# Patient Record
Sex: Male | Born: 1955 | Hispanic: Refuse to answer | Marital: Married | Smoking: Former smoker
Health system: Southern US, Community
[De-identification: ages and names within clinical notes are randomized; demographics above are authoritative.]

## PROBLEM LIST (undated history)

## (undated) DIAGNOSIS — M4850XA Collapsed vertebra, not elsewhere classified, site unspecified, initial encounter for fracture: Secondary | ICD-10-CM

## (undated) DIAGNOSIS — R0602 Shortness of breath: Secondary | ICD-10-CM

## (undated) DIAGNOSIS — E079 Disorder of thyroid, unspecified: Secondary | ICD-10-CM

## (undated) DIAGNOSIS — J439 Emphysema, unspecified: Secondary | ICD-10-CM

## (undated) DIAGNOSIS — R918 Other nonspecific abnormal finding of lung field: Secondary | ICD-10-CM

## (undated) DIAGNOSIS — G4730 Sleep apnea, unspecified: Secondary | ICD-10-CM

## (undated) DIAGNOSIS — J449 Chronic obstructive pulmonary disease, unspecified: Secondary | ICD-10-CM

## (undated) DIAGNOSIS — G4734 Idiopathic sleep related nonobstructive alveolar hypoventilation: Secondary | ICD-10-CM

## (undated) DIAGNOSIS — E538 Deficiency of other specified B group vitamins: Secondary | ICD-10-CM

## (undated) DIAGNOSIS — E039 Hypothyroidism, unspecified: Secondary | ICD-10-CM

## (undated) DIAGNOSIS — E785 Hyperlipidemia, unspecified: Secondary | ICD-10-CM

## (undated) DIAGNOSIS — J189 Pneumonia, unspecified organism: Secondary | ICD-10-CM

## (undated) DIAGNOSIS — D696 Thrombocytopenia, unspecified: Secondary | ICD-10-CM

## (undated) DIAGNOSIS — Z8709 Personal history of other diseases of the respiratory system: Secondary | ICD-10-CM

## (undated) DIAGNOSIS — M5136 Other intervertebral disc degeneration, lumbar region: Secondary | ICD-10-CM

## (undated) DIAGNOSIS — M75121 Complete rotator cuff tear or rupture of right shoulder, not specified as traumatic: Secondary | ICD-10-CM

## (undated) DIAGNOSIS — M51369 Other intervertebral disc degeneration, lumbar region without mention of lumbar back pain or lower extremity pain: Secondary | ICD-10-CM

## (undated) HISTORY — PX: ELBOW ARTHROSCOPY: SHX614

## (undated) HISTORY — PX: TONSILLECTOMY: SUR1361

## (undated) HISTORY — PX: COLONOSCOPY: SHX174

## (undated) HISTORY — DX: Pneumonia, unspecified organism: J18.9

## (undated) HISTORY — DX: Chronic obstructive pulmonary disease, unspecified: J44.9

## (undated) HISTORY — PX: OTHER SURGICAL HISTORY: SHX169

## (undated) HISTORY — PX: SHOULDER ARTHROSCOPY: SHX128

## (undated) HISTORY — DX: Hyperlipidemia, unspecified: E78.5

---

## 1980-04-05 HISTORY — PX: HEMORRHOID SURGERY: SHX153

## 1980-04-05 HISTORY — PX: WISDOM TOOTH EXTRACTION: SHX21

## 1989-04-05 HISTORY — PX: BACK SURGERY: SHX140

## 2014-06-20 ENCOUNTER — Encounter: Payer: Self-pay | Admitting: Internal Medicine

## 2015-04-02 ENCOUNTER — Encounter: Payer: Self-pay | Admitting: Internal Medicine

## 2015-04-17 ENCOUNTER — Encounter: Payer: Self-pay | Admitting: Internal Medicine

## 2015-10-10 ENCOUNTER — Ambulatory Visit (INDEPENDENT_AMBULATORY_CARE_PROVIDER_SITE_OTHER): Payer: BLUE CROSS/BLUE SHIELD | Admitting: Internal Medicine

## 2015-10-10 ENCOUNTER — Encounter: Payer: Self-pay | Admitting: Internal Medicine

## 2015-10-10 VITALS — BP 132/84 | HR 84 | Ht 70.0 in | Wt 214.0 lb

## 2015-10-10 DIAGNOSIS — J449 Chronic obstructive pulmonary disease, unspecified: Secondary | ICD-10-CM

## 2015-10-10 DIAGNOSIS — J189 Pneumonia, unspecified organism: Secondary | ICD-10-CM | POA: Diagnosis not present

## 2015-10-10 DIAGNOSIS — E039 Hypothyroidism, unspecified: Secondary | ICD-10-CM | POA: Diagnosis not present

## 2015-10-10 DIAGNOSIS — G4719 Other hypersomnia: Secondary | ICD-10-CM

## 2015-10-10 DIAGNOSIS — R0600 Dyspnea, unspecified: Secondary | ICD-10-CM

## 2015-10-10 NOTE — Addendum Note (Signed)
Addended by: Oscar La R on: 10/10/2015 11:02 AM   Modules accepted: Orders

## 2015-10-10 NOTE — Patient Instructions (Addendum)
1.obtain PFT and 6MWT 2.check ONO 3.sleep study 4.ct chest without contrast for pneumonia and SOB 5.endocrinology referral    follow up after tests completed  Chronic Obstructive Pulmonary Disease Chronic obstructive pulmonary disease (COPD) is a common lung condition in which airflow from the lungs is limited. COPD is a general term that can be used to describe many different lung problems that limit airflow, including both chronic bronchitis and emphysema. If you have COPD, your lung function will probably never return to normal, but there are measures you can take to improve lung function and make yourself feel better. CAUSES   Smoking (common).  Exposure to secondhand smoke.  Genetic problems.  Chronic inflammatory lung diseases or recurrent infections. SYMPTOMS  Shortness of breath, especially with physical activity.  Deep, persistent (chronic) cough with a large amount of thick mucus.  Wheezing.  Rapid breaths (tachypnea).  Gray or bluish discoloration (cyanosis) of the skin, especially in your fingers, toes, or lips.  Fatigue.  Weight loss.  Frequent infections or episodes when breathing symptoms become much worse (exacerbations).  Chest tightness. DIAGNOSIS Your health care provider will take a medical history and perform a physical examination to diagnose COPD. Additional tests for COPD may include:  Lung (pulmonary) function tests.  Chest X-ray.  CT scan.  Blood tests. TREATMENT  Treatment for COPD may include:  Inhaler and nebulizer medicines. These help manage the symptoms of COPD and make your breathing more comfortable.  Supplemental oxygen. Supplemental oxygen is only helpful if you have a low oxygen level in your blood.  Exercise and physical activity. These are beneficial for nearly all people with COPD.  Lung surgery or transplant.  Nutrition therapy to gain weight, if you are underweight.  Pulmonary rehabilitation. This may involve  working with a team of health care providers and specialists, such as respiratory, occupational, and physical therapists. HOME CARE INSTRUCTIONS  Take all medicines (inhaled or pills) as directed by your health care provider.  Avoid over-the-counter medicines or cough syrups that dry up your airway (such as antihistamines) and slow down the elimination of secretions unless instructed otherwise by your health care provider.  If you are a smoker, the most important thing that you can do is stop smoking. Continuing to smoke will cause further lung damage and breathing trouble. Ask your health care provider for help with quitting smoking. He or she can direct you to community resources or hospitals that provide support.  Avoid exposure to irritants such as smoke, chemicals, and fumes that aggravate your breathing.  Use oxygen therapy and pulmonary rehabilitation if directed by your health care provider. If you require home oxygen therapy, ask your health care provider whether you should purchase a pulse oximeter to measure your oxygen level at home.  Avoid contact with individuals who have a contagious illness.  Avoid extreme temperature and humidity changes.  Eat healthy foods. Eating smaller, more frequent meals and resting before meals may help you maintain your strength.  Stay active, but balance activity with periods of rest. Exercise and physical activity will help you maintain your ability to do things you want to do.  Preventing infection and hospitalization is very important when you have COPD. Make sure to receive all the vaccines your health care provider recommends, especially the pneumococcal and influenza vaccines. Ask your health care provider whether you need a pneumonia vaccine.  Learn and use relaxation techniques to manage stress.  Learn and use controlled breathing techniques as directed by your health care provider.  Controlled breathing techniques include:  Pursed lip  breathing. Start by breathing in (inhaling) through your nose for 1 second. Then, purse your lips as if you were going to whistle and breathe out (exhale) through the pursed lips for 2 seconds.  Diaphragmatic breathing. Start by putting one hand on your abdomen just above your waist. Inhale slowly through your nose. The hand on your abdomen should move out. Then purse your lips and exhale slowly. You should be able to feel the hand on your abdomen moving in as you exhale.  Learn and use controlled coughing to clear mucus from your lungs. Controlled coughing is a series of short, progressive coughs. The steps of controlled coughing are: 1. Lean your head slightly forward. 2. Breathe in deeply using diaphragmatic breathing. 3. Try to hold your breath for 3 seconds. 4. Keep your mouth slightly open while coughing twice. 5. Spit any mucus out into a tissue. 6. Rest and repeat the steps once or twice as needed. SEEK MEDICAL CARE IF:  You are coughing up more mucus than usual.  There is a change in the color or thickness of your mucus.  Your breathing is more labored than usual.  Your breathing is faster than usual. SEEK IMMEDIATE MEDICAL CARE IF:  You have shortness of breath while you are resting.  You have shortness of breath that prevents you from:  Being able to talk.  Performing your usual physical activities.  You have chest pain lasting longer than 5 minutes.  Your skin color is more cyanotic than usual.  You measure low oxygen saturations for longer than 5 minutes with a pulse oximeter. MAKE SURE YOU:  Understand these instructions.  Will watch your condition.  Will get help right away if you are not doing well or get worse.   This information is not intended to replace advice given to you by your health care provider. Make sure you discuss any questions you have with your health care provider.   Document Released: 12/30/2004 Document Revised: 04/12/2014 Document  Reviewed: 11/16/2012 Elsevier Interactive Patient Education [employer].

## 2015-10-10 NOTE — Progress Notes (Signed)
[facility] Consultation      Date: 10/10/2015,   MRN# [id number] David [name] [birth year] Code Status:  Code Status History    This patient does not have a recorded code status. Please follow your organizational policy for patients in this situation.     Hosp day:@LENGTHOFSTAYDAYS @ Referring MD: @ATDPROV @     PCP:      AdmissionWeight: 214 lb (97.07 kg)                 CurrentWeight: 214 lb (97.07 kg) David [name] is a 60 y.o. old male seen in consultation for COPD at the request of  Self referral.     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 Patient main complaint is fatigue and DOE  No signs of infection at this time Patient was dx and treated for pneumonia several months ago  patient also with daytime sleepiness and excessive fatigue associated with loud snoring Patient moved from [place] and establishing care here in [place]   Past Medical History  Diagnosis Date  . COPD (chronic obstructive pulmonary disease) ([place])   . Hyperlipidemia   . Pneumonia      SURGICAL HISTORY   Past Surgical History  Procedure Laterality Date  . Back surgery  1991    L5-S1     FAMILY HISTORY   Family History  Problem Relation Age of Onset  . Family history unknown: Yes     SOCIAL HISTORY   Social History  Substance Use Topics  . Smoking status: Former Research scientist (life sciences)  . Smokeless tobacco: None  . Alcohol Use: 0.6 oz/week    1  Cans of beer per week     MEDICATIONS    Home Medication:  Current Outpatient Rx  Name  Route  Sig  Dispense  Refill  . Aclidinium Bromide (TUDORZA PRESSAIR) 400 MCG/ACT AEPB   Inhalation   Inhale 1 puff into the lungs daily.         Marland Kitchen albuterol (PROVENTIL HFA;VENTOLIN HFA) 108 (90 Base) MCG/ACT inhaler   Inhalation   Inhale 2 puffs into the lungs every 4 (four) hours as needed for wheezing or shortness of breath.         . budesonide-formoterol (SYMBICORT) 160-4.5 MCG/ACT inhaler   Inhalation   Inhale 2 puffs into the lungs 2 (two) times daily.         Marland Kitchen levothyroxine (SYNTHROID, LEVOTHROID) 112 MCG tablet   Oral   Take 112 mcg by mouth daily before breakfast.         . simvastatin (ZOCOR) 40 MG tablet   Oral   Take 40 mg by mouth daily.           Current Medication:  Current outpatient prescriptions:  .  Aclidinium Bromide (TUDORZA PRESSAIR) 400 MCG/ACT AEPB, Inhale 1 puff into the lungs daily., Disp: , Rfl:  .  albuterol (PROVENTIL HFA;VENTOLIN HFA) 108 (90 Base) MCG/ACT inhaler, Inhale  2 puffs into the lungs every 4 (four) hours as needed for wheezing or shortness of breath., Disp: , Rfl:  .  budesonide-formoterol (SYMBICORT) 160-4.5 MCG/ACT inhaler, Inhale 2 puffs into the lungs 2 (two) times daily., Disp: , Rfl:  .  levothyroxine (SYNTHROID, LEVOTHROID) 112 MCG tablet, Take 112 mcg by mouth daily before breakfast., Disp: , Rfl:  .  simvastatin (ZOCOR) 40 MG tablet, Take 40 mg by mouth daily., Disp: , Rfl:     ALLERGIES   Penicillins     REVIEW OF SYSTEMS   Review of Systems  Constitutional: Positive for malaise/fatigue. Negative for fever, chills, weight loss and diaphoresis.  HENT: Negative for congestion and hearing loss.   Eyes: Negative for blurred vision and double vision.  Respiratory: Positive for shortness of breath and wheezing. Negative for cough, hemoptysis and sputum production.   Cardiovascular: Negative for chest pain,  palpitations, orthopnea and leg swelling.  Gastrointestinal: Negative for heartburn, nausea, vomiting and abdominal pain.  Genitourinary: Negative for dysuria and urgency.  Musculoskeletal: Negative for myalgias, back pain and neck pain.  Skin: Negative for rash.  Neurological: Positive for weakness. Negative for dizziness, tingling, tremors and headaches.  Endo/Heme/Allergies: Does not bruise/bleed easily.  Psychiatric/Behavioral: Negative for depression, suicidal ideas and substance abuse.  All other systems reviewed and are negative.    VS: BP 132/84 mmHg  Pulse 84  Ht 5\' 10"  (1.778 m)  Wt 214 lb (97.07 kg)  BMI 30.71 kg/m2  SpO2 96%     PHYSICAL EXAM  Physical Exam  Constitutional: He is oriented to person, place, and time. He appears well-developed and well-nourished. No distress.  HENT:  Head: Normocephalic and atraumatic.  Mouth/Throat: No oropharyngeal exudate.  Eyes: EOM are normal. Pupils are equal, round, and reactive to light. No scleral icterus.  Neck: Normal range of motion. Neck supple.  Cardiovascular: Normal rate, regular rhythm and normal heart sounds.   No murmur heard. Pulmonary/Chest: No stridor. He is in respiratory distress. He has wheezes.  Abdominal: Soft. Bowel sounds are normal.  Musculoskeletal: Normal range of motion. He exhibits no edema.  Neurological: He is alert and oriented to person, place, and time. No cranial nerve deficit.  Skin: Skin is warm. He is not diaphoretic.  Psychiatric: He has a normal mood and affect.     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   Patient satisfied with Plan of action and management. All questions answered  [name], M.D.  [facility]  Medical Director [place] Director [facility] Cardio-Pulmonary Department

## 2015-10-14 ENCOUNTER — Telehealth: Payer: Self-pay | Admitting: Internal Medicine

## 2015-10-14 NOTE — Telephone Encounter (Signed)
Received fax from [facility] (Dr. Felipa Eth office) requesting labs on patient that we referred for hypothyroidism.  I advised that we didn't receive any labs, that this was a new patient to [name].  Dr. Felipa Eth office responded back asking [name] to have labs drawn on patient. Please indicate what labs you would like to order and forward message to [facility], CMA due to Amo being off today. Thanks, Catha Gosselin

## 2015-10-16 ENCOUNTER — Encounter: Payer: Self-pay | Admitting: Internal Medicine

## 2015-10-16 NOTE — Telephone Encounter (Signed)
Called and spoke with Dr. Felipa Eth office as to what labs she would like [name] to order. Receptionist stated that she would ask Dr. Felipa Eth nurse and fax over the labs she would like ordered. Called this morning and as of yet (4:00 pm) nothing has been received from Dr. Felipa Eth office to advise of labs that she is requesting. David [name]

## 2015-10-17 NOTE — Telephone Encounter (Signed)
Never received fax from Dr. Felipa Eth office for what labs she wanted [name] to order.  Called back again today spoke with another person who has placed a phone note to address lab order.  She will check with nurse/Dr. Maretta Bees and fax over what labs are needing to be ordered. Rhonda J Cobb

## 2015-10-20 NOTE — Telephone Encounter (Signed)
Records received from [place]. Manuella Ghazi, MD from [address], [place] [address] phone # [phone] and fax # [phone]. Labs have been faxed to Dr. Felipa Eth office to obtain appointment for patient.  All records placed in Dr. Zoila Shutter folder. Pt is aware that records have been received and labs faxed to Dr. Maretta Bees. Waiting on appointment. Rhonda J Cobb

## 2015-10-22 ENCOUNTER — Telehealth: Payer: Self-pay | Admitting: *Deleted

## 2015-10-22 DIAGNOSIS — J449 Chronic obstructive pulmonary disease, unspecified: Secondary | ICD-10-CM

## 2015-10-22 NOTE — Telephone Encounter (Signed)
Pt informed for the need for O2 at night time. Will place order. Nothing further needed.

## 2015-10-23 ENCOUNTER — Ambulatory Visit: Payer: BLUE CROSS/BLUE SHIELD | Attending: Pulmonary Disease

## 2015-10-23 DIAGNOSIS — G4733 Obstructive sleep apnea (adult) (pediatric): Secondary | ICD-10-CM | POA: Insufficient documentation

## 2015-10-23 DIAGNOSIS — G4710 Hypersomnia, unspecified: Secondary | ICD-10-CM | POA: Diagnosis present

## 2015-10-23 DIAGNOSIS — J449 Chronic obstructive pulmonary disease, unspecified: Secondary | ICD-10-CM | POA: Insufficient documentation

## 2015-10-23 DIAGNOSIS — R0683 Snoring: Secondary | ICD-10-CM | POA: Diagnosis not present

## 2015-10-23 DIAGNOSIS — G4719 Other hypersomnia: Secondary | ICD-10-CM

## 2015-10-24 ENCOUNTER — Telehealth: Payer: Self-pay | Admitting: Internal Medicine

## 2015-10-24 NOTE — Telephone Encounter (Signed)
Pt states he has been waiting to hear about the Low dose lung cancer screening CT. LMOVM for shawn to find out what is the hold up for the pt. Will wait back to hear from [facility].

## 2015-10-24 NOTE — Telephone Encounter (Signed)
Pt calling stating he had not heard about the CT  Would like to follow up on this. Please call patient.

## 2015-10-27 ENCOUNTER — Other Ambulatory Visit: Payer: Self-pay

## 2015-10-27 DIAGNOSIS — R5382 Chronic fatigue, unspecified: Secondary | ICD-10-CM

## 2015-10-27 NOTE — Telephone Encounter (Signed)
Pt called and stated that David [name] contacted him about the Low Dose Lung CA screening CT and pt does not qualify for that. Pt has to be a smoker with a 2 ppd history.  Pt records have been received from the [facility].  Pt states that he has had a CXR by Dr. Manuella Ghazi in March or May of this year.   Please advise if you want to re order the CT Chest.Rhonda J Cobb

## 2015-10-28 ENCOUNTER — Other Ambulatory Visit
Admission: RE | Admit: 2015-10-28 | Discharge: 2015-10-28 | Disposition: A | Discharge status: Home / Self Care | Payer: BLUE CROSS/BLUE SHIELD | Source: Ambulatory Visit | Attending: Internal Medicine | Admitting: Internal Medicine

## 2015-10-28 ENCOUNTER — Telehealth: Payer: Self-pay

## 2015-10-28 DIAGNOSIS — R5382 Chronic fatigue, unspecified: Secondary | ICD-10-CM | POA: Insufficient documentation

## 2015-10-28 LAB — T4, FREE: Free T4: 1.01 ng/dL (ref 0.61–1.12)

## 2015-10-28 LAB — TSH: TSH: 2.507 u[IU]/mL (ref 0.350–4.500)

## 2015-10-28 NOTE — Telephone Encounter (Signed)
Pt aware that labs are needed per Dr.Abby's office before referral can be scheduled. Labs ordered. Pt aware & voices understanding. Nothing further needed.

## 2015-10-29 DIAGNOSIS — G4733 Obstructive sleep apnea (adult) (pediatric): Secondary | ICD-10-CM | POA: Diagnosis not present

## 2015-10-30 ENCOUNTER — Ambulatory Visit (INDEPENDENT_AMBULATORY_CARE_PROVIDER_SITE_OTHER): Payer: BLUE CROSS/BLUE SHIELD | Admitting: *Deleted

## 2015-10-30 DIAGNOSIS — J449 Chronic obstructive pulmonary disease, unspecified: Secondary | ICD-10-CM

## 2015-10-30 NOTE — Progress Notes (Signed)
SMW performed today. 

## 2015-11-03 ENCOUNTER — Ambulatory Visit (INDEPENDENT_AMBULATORY_CARE_PROVIDER_SITE_OTHER): Payer: BLUE CROSS/BLUE SHIELD | Admitting: *Deleted

## 2015-11-03 DIAGNOSIS — J449 Chronic obstructive pulmonary disease, unspecified: Secondary | ICD-10-CM

## 2015-11-03 LAB — PULMONARY FUNCTION TEST
DL/VA % PRED: 109 %
DL/VA: 5.06 ml/min/mmHg/L
DLCO UNC % PRED: 66 %
DLCO UNC: 21.56 ml/min/mmHg
FEF 25-75 POST: 0.47 L/s
FEF 25-75 PRE: 0.43 L/s
FEF2575-%Change-Post: 9 %
FEF2575-%Pred-Post: 16 %
FEF2575-%Pred-Pre: 14 %
FEV1-%CHANGE-POST: 4 %
FEV1-%Pred-Post: 30 %
FEV1-%Pred-Pre: 28 %
FEV1-Post: 1.08 L
FEV1-Pre: 1.03 L
FEV1FVC-%Change-Post: 2 %
FEV1FVC-%PRED-PRE: 63 %
FEV6-%CHANGE-POST: 2 %
FEV6-%PRED-POST: 47 %
FEV6-%Pred-Pre: 46 %
FEV6-POST: 2.14 L
FEV6-Pre: 2.08 L
FEV6FVC-%CHANGE-POST: 1 %
FEV6FVC-%PRED-POST: 102 %
FEV6FVC-%PRED-PRE: 101 %
FVC-%CHANGE-POST: 1 %
FVC-%PRED-POST: 46 %
FVC-%Pred-Pre: 45 %
FVC-Post: 2.19 L
FVC-Pre: 2.16 L
PRE FEV6/FVC RATIO: 97 %
Post FEV1/FVC ratio: 49 %
Post FEV6/FVC ratio: 98 %
Pre FEV1/FVC ratio: 48 %

## 2015-11-03 NOTE — Progress Notes (Signed)
PFT performed today with nitrogen washout. 

## 2015-11-06 ENCOUNTER — Ambulatory Visit: Payer: BLUE CROSS/BLUE SHIELD

## 2015-11-07 ENCOUNTER — Encounter: Payer: Self-pay | Admitting: Internal Medicine

## 2015-11-07 ENCOUNTER — Ambulatory Visit (INDEPENDENT_AMBULATORY_CARE_PROVIDER_SITE_OTHER): Payer: BLUE CROSS/BLUE SHIELD | Admitting: Internal Medicine

## 2015-11-07 ENCOUNTER — Ambulatory Visit
Admission: RE | Admit: 2015-11-07 | Discharge: 2015-11-07 | Disposition: A | Discharge status: Home / Self Care | Payer: BLUE CROSS/BLUE SHIELD | Source: Ambulatory Visit | Attending: Internal Medicine | Admitting: Internal Medicine

## 2015-11-07 VITALS — BP 128/72 | HR 70 | Ht 70.0 in | Wt 213.4 lb

## 2015-11-07 DIAGNOSIS — J9611 Chronic respiratory failure with hypoxia: Secondary | ICD-10-CM | POA: Diagnosis not present

## 2015-11-07 DIAGNOSIS — R0600 Dyspnea, unspecified: Secondary | ICD-10-CM

## 2015-11-07 DIAGNOSIS — J449 Chronic obstructive pulmonary disease, unspecified: Secondary | ICD-10-CM

## 2015-11-07 NOTE — Progress Notes (Signed)
[name]      Date: 11/07/2015,   MRN# [id number] David [name] 1955/07/15 Code Status:  Code Status History    This patient does not have a recorded code status. Please follow your organizational policy for patients in this situation.     Hosp day:@LENGTHOFSTAYDAYS @ Referring MD: @ATDPROV @     PCP:      AdmissionWeight: 213 lb 6.4 oz (96.8 kg)                 CurrentWeight: 213 lb 6.4 oz (96.8 kg) David [name] is a 60 y.o. old male seen in consultation for COPD at the request of  Self referral.  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  Aclidinium Bromide (TUDORZA PRESSAIR) 400 MCG/ACT AEPB, Inhale 1 puff into the lungs daily., Disp: , Rfl:  .  albuterol (PROVENTIL HFA;VENTOLIN HFA) 108 (90 Base) MCG/ACT inhaler, Inhale 2 puffs into the lungs every 4 (four) hours as needed for wheezing or shortness of breath., Disp: , Rfl:  .  budesonide-formoterol (SYMBICORT) 160-4.5 MCG/ACT inhaler, Inhale 2 puffs into the lungs 2 (two) times daily., Disp: , Rfl:  .  levothyroxine (SYNTHROID, LEVOTHROID) 112 MCG tablet, Take 112 mcg by mouth daily before breakfast., Disp: , Rfl:  .  montelukast (SINGULAIR) 10 MG tablet, Take 10 mg by mouth at bedtime., Disp: , Rfl:  .  simvastatin (ZOCOR)  40 MG tablet, Take 40 mg by mouth daily., Disp: , Rfl:     ALLERGIES   Penicillins     REVIEW OF SYSTEMS   Review of Systems  Constitutional: Positive for malaise/fatigue. Negative for chills, diaphoresis, fever and weight loss.  HENT: Negative for congestion and hearing loss.   Eyes: Negative for blurred vision and double vision.  Respiratory: Positive for shortness of breath and wheezing. Negative for cough, hemoptysis and sputum production.   Cardiovascular: Negative for chest pain, palpitations, orthopnea and leg swelling.  Gastrointestinal: Negative for heartburn and nausea.  Skin: Negative for rash.  Neurological: Positive for weakness. Negative for headaches.  Psychiatric/Behavioral: Negative for depression.  All other systems reviewed and are negative.    VS: BP 128/72 (BP Location: Right Arm, Cuff Size: Normal)   Pulse 70   Ht 5\' 10"  (1.778 m)   Wt 213 lb 6.4 oz (96.8 kg)   SpO2 96%   BMI 30.62 kg/m      PHYSICAL EXAM  Physical Exam  Constitutional: He is oriented to person, place, and time. He appears well-developed and well-nourished. No distress.  Eyes: EOM are normal.  Cardiovascular: Normal rate, regular rhythm and normal heart sounds.   No murmur heard. Pulmonary/Chest: No stridor. No respiratory distress. He has wheezes.  Abdominal: Soft.  Musculoskeletal: Normal range of motion.  He exhibits no edema.  Neurological: He is alert and oriented to person, place, and time. No cranial nerve deficit.  Skin: Skin is warm. He is not diaphoretic.  Psychiatric: He has a normal mood and affect.     ASSESSMENT/PLAN   60 yo white male seen today for Very severe COPD with chronic hypoxic resp failure Gold Stage D, patient resp status at baseline is poor Now on chronic nocturnal oxygen therapy for hypoxia   1.continue symbicort (LABA/IC) and Tudorza(AC), albuterol as needed 2.needs sleep study to assess for OSA-patient refuses CPAP therapy and wants to lose  weight first 3.CT chest without contrast-patient with h/o pneumonia and COPD exacerbation after getting CXR PA/Lat 4..follow up referral to endocrinologist for hypothyroidism 5.pulm rehab referral    The Patient requires high complexity decision making for assessment and support, frequent evaluation and titration of therapies, application of advanced monitoring technologies and extensive interpretation of multiple databases.   Patient satisfied with Plan of action and management. All questions answered  [name], M.D.  [facility]  Medical Director [place] Director [facility] Cardio-Pulmonary Department

## 2015-11-07 NOTE — Patient Instructions (Addendum)
1.continue oxygen therapy 2.Pulm Rehab referral 3.continue inhaled therapy 4.get CXR PA and LAT 5.obtain CT chest after CXR  Chronic Obstructive Pulmonary Disease Chronic obstructive pulmonary disease (COPD) is a common lung condition in which airflow from the lungs is limited. COPD is a general term that can be used to describe many different lung problems that limit airflow, including both chronic bronchitis and emphysema. If you have COPD, your lung function will probably never return to normal, but there are measures you can take to improve lung function and make yourself feel better. CAUSES   Smoking (common).  Exposure to secondhand smoke.  Genetic problems.  Chronic inflammatory lung diseases or recurrent infections. SYMPTOMS  Shortness of breath, especially with physical activity.  Deep, persistent (chronic) cough with a large amount of thick mucus.  Wheezing.  Rapid breaths (tachypnea).  Gray or bluish discoloration (cyanosis) of the skin, especially in your fingers, toes, or lips.  Fatigue.  Weight loss.  Frequent infections or episodes when breathing symptoms become much worse (exacerbations).  Chest tightness. DIAGNOSIS Your health care provider will take a medical history and perform a physical examination to diagnose COPD. Additional tests for COPD may include:  Lung (pulmonary) function tests.  Chest X-ray.  CT scan.  Blood tests. TREATMENT  Treatment for COPD may include:  Inhaler and nebulizer medicines. These help manage the symptoms of COPD and make your breathing more comfortable.  Supplemental oxygen. Supplemental oxygen is only helpful if you have a low oxygen level in your blood.  Exercise and physical activity. These are beneficial for nearly all people with COPD.  Lung surgery or transplant.  Nutrition therapy to gain weight, if you are underweight.  Pulmonary rehabilitation. This may involve working with a team of health care  providers and specialists, such as respiratory, occupational, and physical therapists. HOME CARE INSTRUCTIONS  Take all medicines (inhaled or pills) as directed by your health care provider.  Avoid over-the-counter medicines or cough syrups that dry up your airway (such as antihistamines) and slow down the elimination of secretions unless instructed otherwise by your health care provider.  If you are a smoker, the most important thing that you can do is stop smoking. Continuing to smoke will cause further lung damage and breathing trouble. Ask your health care provider for help with quitting smoking. He or she can direct you to community resources or hospitals that provide support.  Avoid exposure to irritants such as smoke, chemicals, and fumes that aggravate your breathing.  Use oxygen therapy and pulmonary rehabilitation if directed by your health care provider. If you require home oxygen therapy, ask your health care provider whether you should purchase a pulse oximeter to measure your oxygen level at home.  Avoid contact with individuals who have a contagious illness.  Avoid extreme temperature and humidity changes.  Eat healthy foods. Eating smaller, more frequent meals and resting before meals may help you maintain your strength.  Stay active, but balance activity with periods of rest. Exercise and physical activity will help you maintain your ability to do things you want to do.  Preventing infection and hospitalization is very important when you have COPD. Make sure to receive all the vaccines your health care provider recommends, especially the pneumococcal and influenza vaccines. Ask your health care provider whether you need a pneumonia vaccine.  Learn and use relaxation techniques to manage stress.  Learn and use controlled breathing techniques as directed by your health care provider. Controlled breathing techniques include:  Pursed lip  breathing. Start by breathing in  (inhaling) through your nose for 1 second. Then, purse your lips as if you were going to whistle and breathe out (exhale) through the pursed lips for 2 seconds.  Diaphragmatic breathing. Start by putting one hand on your abdomen just above your waist. Inhale slowly through your nose. The hand on your abdomen should move out. Then purse your lips and exhale slowly. You should be able to feel the hand on your abdomen moving in as you exhale.  Learn and use controlled coughing to clear mucus from your lungs. Controlled coughing is a series of short, progressive coughs. The steps of controlled coughing are: 1. Lean your head slightly forward. 2. Breathe in deeply using diaphragmatic breathing. 3. Try to hold your breath for 3 seconds. 4. Keep your mouth slightly open while coughing twice. 5. Spit any mucus out into a tissue. 6. Rest and repeat the steps once or twice as needed. SEEK MEDICAL CARE IF:  You are coughing up more mucus than usual.  There is a change in the color or thickness of your mucus.  Your breathing is more labored than usual.  Your breathing is faster than usual. SEEK IMMEDIATE MEDICAL CARE IF:  You have shortness of breath while you are resting.  You have shortness of breath that prevents you from:  Being able to talk.  Performing your usual physical activities.  You have chest pain lasting longer than 5 minutes.  Your skin color is more cyanotic than usual.  You measure low oxygen saturations for longer than 5 minutes with a pulse oximeter. MAKE SURE YOU:  Understand these instructions.  Will watch your condition.  Will get help right away if you are not doing well or get worse.   This information is not intended to replace advice given to you by your health care provider. Make sure you discuss any questions you have with your health care provider.   Document Released: 12/30/2004 Document Revised: 04/12/2014 Document Reviewed: 11/16/2012 Elsevier  Interactive Patient Education [employer].

## 2015-11-20 DIAGNOSIS — M75121 Complete rotator cuff tear or rupture of right shoulder, not specified as traumatic: Secondary | ICD-10-CM | POA: Insufficient documentation

## 2015-11-28 DIAGNOSIS — G4734 Idiopathic sleep related nonobstructive alveolar hypoventilation: Secondary | ICD-10-CM | POA: Insufficient documentation

## 2015-12-01 ENCOUNTER — Other Ambulatory Visit: Payer: Self-pay

## 2015-12-01 ENCOUNTER — Other Ambulatory Visit: Payer: Self-pay | Admitting: Internal Medicine

## 2015-12-01 DIAGNOSIS — J441 Chronic obstructive pulmonary disease with (acute) exacerbation: Secondary | ICD-10-CM

## 2015-12-01 DIAGNOSIS — J690 Pneumonitis due to inhalation of food and vomit: Secondary | ICD-10-CM

## 2015-12-01 DIAGNOSIS — J189 Pneumonia, unspecified organism: Secondary | ICD-10-CM

## 2015-12-11 ENCOUNTER — Ambulatory Visit
Admission: RE | Admit: 2015-12-11 | Discharge: 2015-12-11 | Disposition: A | Discharge status: Home / Self Care | Payer: BLUE CROSS/BLUE SHIELD | Source: Ambulatory Visit | Attending: Internal Medicine | Admitting: Internal Medicine

## 2015-12-11 DIAGNOSIS — R918 Other nonspecific abnormal finding of lung field: Secondary | ICD-10-CM | POA: Insufficient documentation

## 2015-12-11 DIAGNOSIS — J189 Pneumonia, unspecified organism: Secondary | ICD-10-CM

## 2015-12-11 DIAGNOSIS — J8410 Pulmonary fibrosis, unspecified: Secondary | ICD-10-CM | POA: Insufficient documentation

## 2015-12-11 DIAGNOSIS — J441 Chronic obstructive pulmonary disease with (acute) exacerbation: Secondary | ICD-10-CM | POA: Diagnosis present

## 2015-12-28 ENCOUNTER — Encounter: Payer: Self-pay | Admitting: *Deleted

## 2015-12-28 ENCOUNTER — Ambulatory Visit
Admission: EM | Admit: 2015-12-28 | Discharge: 2015-12-28 | Disposition: A | Discharge status: Home / Self Care | Payer: BLUE CROSS/BLUE SHIELD | Attending: Family Medicine | Admitting: Family Medicine

## 2015-12-28 DIAGNOSIS — L97921 Non-pressure chronic ulcer of unspecified part of left lower leg limited to breakdown of skin: Secondary | ICD-10-CM | POA: Diagnosis not present

## 2015-12-28 HISTORY — DX: Disorder of thyroid, unspecified: E07.9

## 2015-12-28 MED ORDER — SULFAMETHOXAZOLE-TRIMETHOPRIM 800-160 MG PO TABS: 1.0000 | ORAL_TABLET | Freq: Two times a day (BID) | ORAL | 0 refills | Status: DC

## 2015-12-28 NOTE — ED Provider Notes (Signed)
[facility]    CSN: [id number] Arrival date & time: 12/28/15  1223  First Provider Contact:  None       History   Chief Complaint Chief Complaint  Patient presents with  . Skin Ulcer    HPI David [name] is a 60 y.o. male.   Patient seen 3 weeks ago by pcp for treatment of exposure to poison sumac. The rash cleared up with treatment but left 2 skin ulcers to left calf. Pt states significant pain with area. Site appears red and open. Denies any fevers or chills.         The history is provided by the patient.    Past Medical History:  Diagnosis Date  . COPD (chronic obstructive pulmonary disease) ([place])   . Hyperlipidemia   . Pneumonia   . Thyroid disease     Patient Active Problem List   Diagnosis Date Noted  . COPD (chronic obstructive pulmonary disease) ([place]) 10/10/2015    Past Surgical History:  Procedure Laterality Date  . BACK SURGERY  1991   L5-S1       Home Medications    Prior to Admission medications   Medication Sig Start Date End Date Taking? Authorizing Provider  Aclidinium Bromide (TUDORZA PRESSAIR) 400 MCG/ACT AEPB Inhale 1 puff into the lungs daily.   Yes Historical Provider, MD  albuterol (PROVENTIL HFA;VENTOLIN HFA) 108 (90 Base) MCG/ACT inhaler Inhale 2 puffs into the lungs every 4 (four) hours as needed for wheezing or shortness of breath.   Yes Historical Provider, MD  budesonide-formoterol (SYMBICORT) 160-4.5 MCG/ACT inhaler Inhale 2 puffs into the lungs 2 (two) times daily.   Yes Historical Provider, MD  levothyroxine (SYNTHROID, LEVOTHROID) 112 MCG tablet Take 112 mcg by mouth daily before breakfast.   Yes Historical Provider, MD  montelukast (SINGULAIR) 10 MG tablet Take 10 mg by mouth at bedtime.   Yes Historical Provider, MD  simvastatin (ZOCOR) 40 MG tablet Take 40 mg by mouth daily.   Yes Historical Provider, MD  sulfamethoxazole-trimethoprim (BACTRIM DS,SEPTRA DS) 800-160 MG tablet Take 1 tablet by mouth 2 (two)  times daily. 12/28/15   Norval Gable, MD    Family History Family History  Problem Relation Age of Onset  . Family history unknown: Yes    Social History Social History  Substance Use Topics  . Smoking status: Former Research scientist (life sciences)  . Smokeless tobacco: Never Used  . Alcohol use 0.6 oz/week    1 Cans of beer per week     Allergies   Penicillins   Review of Systems Review of Systems   Physical Exam Triage Vital Signs ED Triage Vitals  Enc Vitals Group     BP 12/28/15 1239 138/86     Pulse Rate 12/28/15 1239 76     Resp 12/28/15 1239 16     Temp 12/28/15 1239 97.9 F (36.6 C)     Temp Source 12/28/15 1239 Oral     SpO2 12/28/15 1239 98 %     Weight 12/28/15 1243 215 lb (97.5 kg)     Height 12/28/15 1243 5\' 10"  (1.778 m)     Head Circumference --      Peak Flow --      Pain Score --      Pain Loc --      Pain Edu? --      Excl. in [place]? --    No data found.   Updated Vital Signs BP 138/86 (BP Location: Left Arm)  Pulse 76   Temp 97.9 F (36.6 C) (Oral)   Resp 16   Ht 5\' 10"  (1.778 m)   Wt 215 lb (97.5 kg)   SpO2 98%   BMI 30.85 kg/m   Visual Acuity Right Eye Distance:   Left Eye Distance:   Bilateral Distance:    Right Eye Near:   Left Eye Near:    Bilateral Near:     Physical Exam  Constitutional: He appears well-developed and well-nourished. No distress.  Skin: He is not diaphoretic.  3cm x1.5cm skin blanchable erythema and tenderness to palpation, surrounding slight 1.5cm superficial ulceration  Nursing note and vitals reviewed.    UC Treatments / Results  Labs (all labs ordered are listed, but only abnormal results are displayed) Labs Reviewed - No data to display  EKG  EKG Interpretation None       Radiology No results found.  Procedures Procedures (including critical care time)  Medications Ordered in UC Medications - No data to display   Initial Impression / Assessment and Plan / UC Course  I have reviewed the triage  vital signs and the nursing notes.  Pertinent labs & imaging results that were available during my care of the patient were reviewed by me and considered in my medical decision making (see chart for details).  Clinical Course      Final Clinical Impressions(s) / UC Diagnoses   Final diagnoses:  Skin ulcer of left lower leg, limited to breakdown of skin [facility])    New Prescriptions Discharge Medication List as of 12/28/2015  1:34 PM    START taking these medications   Details  sulfamethoxazole-trimethoprim (BACTRIM DS,SEPTRA DS) 800-160 MG tablet Take 1 tablet by mouth 2 (two) times daily., Starting Sun 12/28/2015, Normal       1. diagnosis reviewed with patient 2. rx as per orders above; reviewed possible side effects, interactions, risks and benefits  3. Recommend supportive treatment with warm compresses  4. Follow-up prn if symptoms worsen or don't improve   Norval Gable, MD 12/28/15 1349

## 2015-12-28 NOTE — ED Triage Notes (Signed)
PT seen 3 weeks ago for treatment of exposure to poison sumac. The rash cleared up with treatment but left 2 skin ulcers to left calf. Pt states significant pain with area. Site appears red and open.

## 2015-12-30 ENCOUNTER — Telehealth: Payer: Self-pay | Admitting: *Deleted

## 2015-12-30 NOTE — Telephone Encounter (Signed)
Called patient, verified DOB, patient reports that his wound is feeling better and improving. Advised patient to follow up with PCP if wound becomes worse.

## 2016-01-29 ENCOUNTER — Encounter: Payer: Self-pay | Admitting: Internal Medicine

## 2016-01-29 ENCOUNTER — Ambulatory Visit (INDEPENDENT_AMBULATORY_CARE_PROVIDER_SITE_OTHER): Payer: BLUE CROSS/BLUE SHIELD | Admitting: Internal Medicine

## 2016-01-29 VITALS — BP 138/82 | HR 92 | Wt 222.0 lb

## 2016-01-29 DIAGNOSIS — G4733 Obstructive sleep apnea (adult) (pediatric): Secondary | ICD-10-CM

## 2016-01-29 DIAGNOSIS — J449 Chronic obstructive pulmonary disease, unspecified: Secondary | ICD-10-CM

## 2016-01-29 NOTE — Addendum Note (Signed)
Addended by: Oscar La R on: 01/29/2016 11:27 AM   Modules accepted: Orders

## 2016-01-29 NOTE — Patient Instructions (Addendum)
Start AUTO CPAP therapy 5-15cm h20 Continue Inhalers as prescribed   Sleep Apnea  Sleep apnea is a sleep disorder characterized by abnormal pauses in breathing while you sleep. When your breathing pauses, the level of oxygen in your blood decreases. This causes you to move out of deep sleep and into light sleep. As a result, your quality of sleep is poor, and the system that carries your blood throughout your body (cardiovascular system) experiences stress. If sleep apnea remains untreated, the following conditions can develop:  High blood pressure (hypertension).  Coronary artery disease.  Inability to achieve or maintain an erection (impotence).  Impairment of your thought process (cognitive dysfunction). There are three types of sleep apnea: 1. Obstructive sleep apnea--Pauses in breathing during sleep because of a blocked airway. 2. Central sleep apnea--Pauses in breathing during sleep because the area of the brain that controls your breathing does not send the correct signals to the muscles that control breathing. 3. Mixed sleep apnea--A combination of both obstructive and central sleep apnea. RISK FACTORS The following risk factors can increase your risk of developing sleep apnea:  Being overweight.  Smoking.  Having narrow passages in your nose and throat.  Being of older age.  Being male.  Alcohol use.  Sedative and tranquilizer use.  Ethnicity. Among individuals younger than 35 years, African Americans are at increased risk of sleep apnea. SYMPTOMS   Difficulty staying asleep.  Daytime sleepiness and fatigue.  Loss of energy.  Irritability.  Loud, heavy snoring.  Morning headaches.  Trouble concentrating.  Forgetfulness.  Decreased interest in sex.  Unexplained sleepiness. DIAGNOSIS  In order to diagnose sleep apnea, your caregiver will perform a physical examination. A sleep study done in the comfort of your own home may be appropriate if you are  otherwise healthy. Your caregiver may also recommend that you spend the night in a sleep lab. In the sleep lab, several monitors record information about your heart, lungs, and brain while you sleep. Your leg and arm movements and blood oxygen level are also recorded. TREATMENT The following actions may help to resolve mild sleep apnea:  Sleeping on your side.   Using a decongestant if you have nasal congestion.   Avoiding the use of depressants, including alcohol, sedatives, and narcotics.   Losing weight and modifying your diet if you are overweight. There also are devices and treatments to help open your airway:  Oral appliances. These are custom-made mouthpieces that shift your lower jaw forward and slightly open your bite. This opens your airway.  Devices that create positive airway pressure. This positive pressure "splints" your airway open to help you breathe better during sleep. The following devices create positive airway pressure:  Continuous positive airway pressure (CPAP) device. The CPAP device creates a continuous level of air pressure with an air pump. The air is delivered to your airway through a mask while you sleep. This continuous pressure keeps your airway open.  Nasal expiratory positive airway pressure (EPAP) device. The EPAP device creates positive air pressure as you exhale. The device consists of single-use valves, which are inserted into each nostril and held in place by adhesive. The valves create very little resistance when you inhale but create much more resistance when you exhale. That increased resistance creates the positive airway pressure. This positive pressure while you exhale keeps your airway open, making it easier to breath when you inhale again.  Bilevel positive airway pressure (BPAP) device. The BPAP device is used mainly in patients with  central sleep apnea. This device is similar to the CPAP device because it also uses an air pump to deliver  continuous air pressure through a mask. However, with the BPAP machine, the pressure is set at two different levels. The pressure when you exhale is lower than the pressure when you inhale.  Surgery. Typically, surgery is only done if you cannot comply with less invasive treatments or if the less invasive treatments do not improve your condition. Surgery involves removing excess tissue in your airway to create a wider passage way.   This information is not intended to replace advice given to you by your health care provider. Make sure you discuss any questions you have with your health care provider.   Document Released: 03/12/2002 Document Revised: 04/12/2014 Document Reviewed: 07/29/2011 Elsevier Interactive Patient Education [employer].

## 2016-01-29 NOTE — Progress Notes (Signed)
[facility] Consultation      Date: 01/29/2016,   MRN# [id number] David [name] Jun 08, 1955 Code Status:  Code Status History    This patient does not have a recorded code status. Please follow your organizational policy for patients in this situation.     Hosp day:@LENGTHOFSTAYDAYS @ Referring MD: @ATDPROV @     PCP:    David [name] is a 60 y.o. old male seen in consultation for COPD at the request of  Self referral.  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  Aclidinium Bromide (TUDORZA PRESSAIR) 400 MCG/ACT AEPB, Inhale 1 puff into the lungs daily., Disp: , Rfl:  .  albuterol (PROVENTIL HFA;VENTOLIN HFA) 108 (90 Base) MCG/ACT inhaler, Inhale 2 puffs into the lungs every 4 (four) hours as needed for wheezing or shortness of breath., Disp: , Rfl:  .  budesonide-formoterol (SYMBICORT) 160-4.5 MCG/ACT inhaler, Inhale 2 puffs into the lungs 2 (two) times daily., Disp: , Rfl:  .  levothyroxine (SYNTHROID, LEVOTHROID) 112 MCG tablet, Take 112 mcg by mouth daily before breakfast., Disp: , Rfl:  .  montelukast (SINGULAIR) 10 MG tablet, Take 10 mg by mouth at bedtime., Disp: , Rfl:  .  simvastatin  (ZOCOR) 40 MG tablet, Take 40 mg by mouth daily., Disp: , Rfl:  .  sulfamethoxazole-trimethoprim (BACTRIM DS,SEPTRA DS) 800-160 MG tablet, Take 1 tablet by mouth 2 (two) times daily., Disp: 20 tablet, Rfl: 0    ALLERGIES   Penicillins     REVIEW OF SYSTEMS   Review of Systems  Constitutional: Positive for malaise/fatigue. Negative for chills, diaphoresis, fever and weight loss.  HENT: Negative for congestion and hearing loss.   Respiratory: Positive for shortness of breath and wheezing. Negative for cough, hemoptysis and sputum production.   Cardiovascular: Negative for chest pain, palpitations, orthopnea and leg swelling.  Gastrointestinal: Negative for heartburn and nausea.  Skin: Negative for rash.  Neurological: Positive for weakness. Negative for headaches.  Psychiatric/Behavioral: Negative for depression.  All other systems reviewed and are negative.     PHYSICAL EXAM  Physical Exam  Constitutional: He is oriented to person, place, and time. He appears well-developed and well-nourished. No distress.  Eyes: EOM are normal.  Cardiovascular: Normal rate, regular rhythm and normal heart sounds.   No murmur heard. Pulmonary/Chest: No stridor. No respiratory distress. He has no wheezes. He has no rales.  Abdominal: Soft.  Musculoskeletal: Normal range of motion. He exhibits no edema.  Neurological: He is alert and oriented to person, place, and time. No cranial nerve deficit.  Skin: Skin is warm. He is not diaphoretic.  Psychiatric: He has a normal mood and affect.  CT chest 12/2015 images reviewed 01/29/2016  Emphysematous changes, pulmonary scarring and patchy areas of peribronchial thickening and minimal bronchiectasis.   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   Patient satisfied with Plan of action and management. All questions answered  [name], M.D.  [facility]  Medical Director [place] Director [facility] Cardio-Pulmonary Department

## 2016-02-16 ENCOUNTER — Encounter: Payer: Self-pay | Admitting: Internal Medicine

## 2016-02-16 DIAGNOSIS — J449 Chronic obstructive pulmonary disease, unspecified: Secondary | ICD-10-CM

## 2016-02-22 ENCOUNTER — Encounter: Payer: Self-pay | Admitting: Internal Medicine

## 2016-03-04 ENCOUNTER — Telehealth: Payer: Self-pay | Admitting: Internal Medicine

## 2016-03-04 DIAGNOSIS — G4733 Obstructive sleep apnea (adult) (pediatric): Secondary | ICD-10-CM

## 2016-03-04 NOTE — Telephone Encounter (Signed)
Pt informed he needs O2 but will need a CPAP titration and bleed in O2 as needed. Pt agrees. Nothing further needed.

## 2016-03-04 NOTE — Telephone Encounter (Signed)
Pt would like oxygen test results. Please call.

## 2016-03-12 ENCOUNTER — Ambulatory Visit: Payer: BLUE CROSS/BLUE SHIELD | Attending: Pulmonary Disease

## 2016-03-12 DIAGNOSIS — G4733 Obstructive sleep apnea (adult) (pediatric): Secondary | ICD-10-CM | POA: Insufficient documentation

## 2016-03-18 DIAGNOSIS — G4733 Obstructive sleep apnea (adult) (pediatric): Secondary | ICD-10-CM | POA: Diagnosis not present

## 2016-03-19 ENCOUNTER — Telehealth: Payer: Self-pay

## 2016-03-19 DIAGNOSIS — G4733 Obstructive sleep apnea (adult) (pediatric): Secondary | ICD-10-CM

## 2016-03-19 NOTE — Telephone Encounter (Signed)
Lmtcb. Will await call back 

## 2016-03-19 NOTE — Telephone Encounter (Addendum)
Per DR (below message) ordered has been placed to d/c oxygen as it is not needed with a set pressure of 17cm. Pt aware and voiced his understanding. Nothing further needed.

## 2016-03-19 NOTE — Telephone Encounter (Signed)
-----   Message from Laverle Hobby, MD sent at 03/18/2016  4:17 PM EST ----- Regarding: cpap titration.  CPAP titration study showed pt will need CPAP of 17 cmH2O.  Did not require oxygen at this level of CPAP.

## 2016-03-19 NOTE — Addendum Note (Signed)
Addended by: Maryanna Shape A on: 03/19/2016 10:36 AM   Modules accepted: Orders

## 2016-03-19 NOTE — Telephone Encounter (Signed)
Pt aware of results. Order placed. Pt scheduled for DK on 05-06-16. Nothing further needed.

## 2016-05-06 ENCOUNTER — Ambulatory Visit: Payer: BLUE CROSS/BLUE SHIELD | Admitting: Internal Medicine

## 2016-05-07 ENCOUNTER — Encounter: Payer: Self-pay | Admitting: Medical Oncology

## 2016-05-07 ENCOUNTER — Emergency Department
Admission: EM | Admit: 2016-05-07 | Discharge: 2016-05-07 | Disposition: A | Discharge status: Home / Self Care | Payer: BLUE CROSS/BLUE SHIELD | Attending: Emergency Medicine | Admitting: Emergency Medicine

## 2016-05-07 ENCOUNTER — Emergency Department: Payer: BLUE CROSS/BLUE SHIELD

## 2016-05-07 DIAGNOSIS — S0990XA Unspecified injury of head, initial encounter: Secondary | ICD-10-CM | POA: Diagnosis present

## 2016-05-07 DIAGNOSIS — M542 Cervicalgia: Secondary | ICD-10-CM | POA: Diagnosis not present

## 2016-05-07 DIAGNOSIS — Y999 Unspecified external cause status: Secondary | ICD-10-CM | POA: Diagnosis not present

## 2016-05-07 DIAGNOSIS — Y92410 Unspecified street and highway as the place of occurrence of the external cause: Secondary | ICD-10-CM | POA: Diagnosis not present

## 2016-05-07 DIAGNOSIS — R51 Headache: Secondary | ICD-10-CM | POA: Insufficient documentation

## 2016-05-07 DIAGNOSIS — Y9389 Activity, other specified: Secondary | ICD-10-CM | POA: Insufficient documentation

## 2016-05-07 DIAGNOSIS — Z87891 Personal history of nicotine dependence: Secondary | ICD-10-CM | POA: Insufficient documentation

## 2016-05-07 DIAGNOSIS — J449 Chronic obstructive pulmonary disease, unspecified: Secondary | ICD-10-CM | POA: Insufficient documentation

## 2016-05-07 DIAGNOSIS — Z79899 Other long term (current) drug therapy: Secondary | ICD-10-CM | POA: Insufficient documentation

## 2016-05-07 MED ORDER — MELOXICAM 7.5 MG PO TABS: 7.5000 mg | ORAL_TABLET | Freq: Every day | ORAL | 1 refills | Status: AC

## 2016-05-07 MED ORDER — KETOROLAC TROMETHAMINE 60 MG/2ML IM SOLN: 60.0000 mg | Freq: Once | INTRAMUSCULAR | Status: DC

## 2016-05-07 MED ORDER — KETOROLAC TROMETHAMINE 30 MG/ML IJ SOLN
30.0000 mg | Freq: Once | INTRAMUSCULAR | Status: AC
  Administered 2016-05-07: 30 mg via INTRAMUSCULAR
  Filled 2016-05-07: qty 1

## 2016-05-07 MED ORDER — CYCLOBENZAPRINE HCL 5 MG PO TABS: 5.0000 mg | ORAL_TABLET | Freq: Three times a day (TID) | ORAL | 0 refills | Status: AC | PRN
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
## 2016-05-11 DIAGNOSIS — S43409A Unspecified sprain of unspecified shoulder joint, initial encounter: Secondary | ICD-10-CM | POA: Insufficient documentation

## 2016-05-11 DIAGNOSIS — S134XXA Sprain of ligaments of cervical spine, initial encounter: Secondary | ICD-10-CM | POA: Insufficient documentation

## 2016-05-17 ENCOUNTER — Encounter: Payer: Self-pay | Admitting: Internal Medicine

## 2016-05-17 ENCOUNTER — Ambulatory Visit (INDEPENDENT_AMBULATORY_CARE_PROVIDER_SITE_OTHER): Payer: BLUE CROSS/BLUE SHIELD | Admitting: Internal Medicine

## 2016-05-17 VITALS — BP 138/90 | HR 94 | Wt 216.0 lb

## 2016-05-17 DIAGNOSIS — G4730 Sleep apnea, unspecified: Secondary | ICD-10-CM

## 2016-05-17 DIAGNOSIS — J449 Chronic obstructive pulmonary disease, unspecified: Secondary | ICD-10-CM | POA: Diagnosis not present

## 2016-05-17 NOTE — Addendum Note (Signed)
Addended by: Oscar La R on: 05/17/2016 01:57 PM   Modules accepted: Orders

## 2016-05-17 NOTE — Patient Instructions (Signed)
contineu CPAP therapy as prescribed Continue inhaler therapy as prescribed Find out about Insurance regarding Pulmonary rehab for COPD

## 2016-05-17 NOTE — Progress Notes (Signed)
[facility] Consultation      Date: 05/17/2016,   MRN# [id number] Olga Norsworthy III 1955-08-19 Code Status:  Code Status History    This patient does not have a recorded code status. Please follow your organizational policy for patients in this situation.     Hosp day:@LENGTHOFSTAYDAYS @ Referring MD: @ATDPROV @     PCP:    Crespin Mostoller III is a 61 y.o. old male seen in consultation for COPD at the request of  Self referral.  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  Aclidinium Bromide (TUDORZA PRESSAIR) 400 MCG/ACT AEPB, Inhale 1 puff into the lungs daily., Disp: , Rfl:  .  albuterol (PROVENTIL HFA;VENTOLIN HFA) 108 (90 Base) MCG/ACT inhaler, Inhale 2 puffs into the lungs every 4 (four) hours as needed for wheezing or shortness of breath., Disp: , Rfl:  .  budesonide-formoterol (SYMBICORT) 160-4.5 MCG/ACT inhaler, Inhale 2 puffs into the lungs 2 (two) times daily., Disp: , Rfl:  .  levothyroxine (SYNTHROID, LEVOTHROID) 112 MCG tablet, Take 112 mcg by mouth daily  before breakfast., Disp: , Rfl:  .  montelukast (SINGULAIR) 10 MG tablet, Take 10 mg by mouth at bedtime., Disp: , Rfl:  .  simvastatin (ZOCOR) 40 MG tablet, Take 40 mg by mouth daily., Disp: , Rfl:     ALLERGIES   Penicillins     REVIEW OF SYSTEMS   Review of Systems  Constitutional: Negative for chills, diaphoresis, fever, malaise/fatigue and weight loss.  HENT: Negative for congestion and hearing loss.   Respiratory: Positive for shortness of breath. Negative for cough, hemoptysis, sputum production and wheezing.   Cardiovascular: Negative for chest pain, palpitations, orthopnea and leg swelling.  Gastrointestinal: Negative for heartburn and nausea.  Skin: Negative for rash.  Neurological: Negative for weakness and headaches.  Psychiatric/Behavioral: Negative for depression.  All other systems reviewed and are negative.     PHYSICAL EXAM  Physical Exam  Constitutional: He is oriented to person, place, and time. He appears well-developed and well-nourished. No distress.  Eyes: EOM are normal.  Cardiovascular: Normal rate, regular rhythm and normal heart sounds.   No murmur heard. Pulmonary/Chest: No stridor. No respiratory distress. He has no wheezes. He has no rales.  Abdominal: Soft.  Musculoskeletal: Normal range of motion. He exhibits no edema.  Neurological: He is alert and oriented to person, place, and time. No cranial nerve deficit.  Skin: Skin is warm. He is not diaphoretic.  Psychiatric: He has a normal mood and affect.    CT  chest 12/2015  Emphysematous changes, pulmonary scarring and patchy areas of peribronchial thickening and minimal bronchiectasis.   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 His OSA is well treated and doing well overall.   1.continue symbicort (LABA/IC) and Tudorza(AC), albuterol as needed 2.AUTO CPAP therapy 5-15 cm  h20 3.referral to pulmonary rehab if insurance covers well   Follow up in 3 months   The Patient requires high complexity decision making for assessment and support, frequent evaluation and titration of therapies, application of advanced monitoring technologies and extensive interpretation of multiple databases.   Patient satisfied with Plan of action and management. All questions answered  [name], M.D.  [facility]  Medical Director [place] Director [facility] Cardio-Pulmonary Department

## 2016-05-25 ENCOUNTER — Telehealth: Payer: Self-pay | Admitting: Internal Medicine

## 2016-05-25 NOTE — Telephone Encounter (Signed)
Pt calling asking if we can send a "soclean" to clean out his CPAP machine  He states he would like to know if his insurance will cover this and would like [name] to send it to [facility] Please call back and let patient know.

## 2016-05-25 NOTE — Telephone Encounter (Signed)
Informed pt the so clean is a tv special and not thru DME. Pt states he will call the tv number. Nothing further needed.

## 2016-05-28 DIAGNOSIS — D696 Thrombocytopenia, unspecified: Secondary | ICD-10-CM | POA: Insufficient documentation

## 2016-06-03 ENCOUNTER — Other Ambulatory Visit: Payer: Self-pay | Admitting: Internal Medicine

## 2016-06-03 ENCOUNTER — Ambulatory Visit
Admission: RE | Admit: 2016-06-03 | Discharge: 2016-06-03 | Disposition: A | Discharge status: Home / Self Care | Payer: BLUE CROSS/BLUE SHIELD | Source: Ambulatory Visit | Attending: Internal Medicine | Admitting: Internal Medicine

## 2016-06-03 DIAGNOSIS — M48061 Spinal stenosis, lumbar region without neurogenic claudication: Secondary | ICD-10-CM | POA: Diagnosis not present

## 2016-06-03 DIAGNOSIS — R29890 Loss of height: Secondary | ICD-10-CM | POA: Diagnosis not present

## 2016-06-03 DIAGNOSIS — M4854XA Collapsed vertebra, not elsewhere classified, thoracic region, initial encounter for fracture: Secondary | ICD-10-CM | POA: Insufficient documentation

## 2016-06-03 DIAGNOSIS — M545 Low back pain: Secondary | ICD-10-CM | POA: Insufficient documentation

## 2016-06-03 DIAGNOSIS — G8911 Acute pain due to trauma: Secondary | ICD-10-CM

## 2016-06-03 DIAGNOSIS — I700 Atherosclerosis of aorta: Secondary | ICD-10-CM | POA: Diagnosis not present

## 2016-06-03 DIAGNOSIS — M5136 Other intervertebral disc degeneration, lumbar region: Secondary | ICD-10-CM | POA: Insufficient documentation

## 2016-06-04 ENCOUNTER — Other Ambulatory Visit: Payer: Self-pay | Admitting: Internal Medicine

## 2016-06-04 DIAGNOSIS — S22080A Wedge compression fracture of T11-T12 vertebra, initial encounter for closed fracture: Secondary | ICD-10-CM | POA: Insufficient documentation

## 2016-06-04 DIAGNOSIS — Z8781 Personal history of (healed) traumatic fracture: Secondary | ICD-10-CM | POA: Insufficient documentation

## 2016-06-07 ENCOUNTER — Ambulatory Visit
Admission: RE | Admit: 2016-06-07 | Discharge: 2016-06-07 | Disposition: A | Discharge status: Home / Self Care | Payer: BLUE CROSS/BLUE SHIELD | Source: Ambulatory Visit | Attending: Internal Medicine | Admitting: Internal Medicine

## 2016-06-07 DIAGNOSIS — S22080A Wedge compression fracture of T11-T12 vertebra, initial encounter for closed fracture: Secondary | ICD-10-CM

## 2016-06-09 ENCOUNTER — Encounter
Admission: RE | Admit: 2016-06-09 | Discharge: 2016-06-09 | Disposition: A | Discharge status: Home / Self Care | Payer: BLUE CROSS/BLUE SHIELD | Source: Ambulatory Visit | Attending: Orthopedic Surgery | Admitting: Orthopedic Surgery

## 2016-06-09 DIAGNOSIS — M8088XA Other osteoporosis with current pathological fracture, vertebra(e), initial encounter for fracture: Secondary | ICD-10-CM | POA: Diagnosis not present

## 2016-06-09 DIAGNOSIS — Z79891 Long term (current) use of opiate analgesic: Secondary | ICD-10-CM | POA: Diagnosis not present

## 2016-06-09 DIAGNOSIS — E039 Hypothyroidism, unspecified: Secondary | ICD-10-CM | POA: Diagnosis not present

## 2016-06-09 DIAGNOSIS — M4854XA Collapsed vertebra, not elsewhere classified, thoracic region, initial encounter for fracture: Secondary | ICD-10-CM | POA: Diagnosis not present

## 2016-06-09 DIAGNOSIS — Z981 Arthrodesis status: Secondary | ICD-10-CM | POA: Diagnosis not present

## 2016-06-09 DIAGNOSIS — Z880 Allergy status to penicillin: Secondary | ICD-10-CM | POA: Diagnosis not present

## 2016-06-09 DIAGNOSIS — Z7952 Long term (current) use of systemic steroids: Secondary | ICD-10-CM | POA: Diagnosis not present

## 2016-06-09 DIAGNOSIS — J449 Chronic obstructive pulmonary disease, unspecified: Secondary | ICD-10-CM | POA: Diagnosis not present

## 2016-06-09 DIAGNOSIS — E785 Hyperlipidemia, unspecified: Secondary | ICD-10-CM | POA: Diagnosis not present

## 2016-06-09 DIAGNOSIS — Z79899 Other long term (current) drug therapy: Secondary | ICD-10-CM | POA: Diagnosis not present

## 2016-06-09 DIAGNOSIS — Z01818 Encounter for other preprocedural examination: Secondary | ICD-10-CM | POA: Diagnosis not present

## 2016-06-09 DIAGNOSIS — Z7951 Long term (current) use of inhaled steroids: Secondary | ICD-10-CM | POA: Diagnosis not present

## 2016-06-09 DIAGNOSIS — X58XXXA Exposure to other specified factors, initial encounter: Secondary | ICD-10-CM | POA: Diagnosis not present

## 2016-06-09 DIAGNOSIS — Z87891 Personal history of nicotine dependence: Secondary | ICD-10-CM | POA: Diagnosis not present

## 2016-06-09 HISTORY — DX: Sleep apnea, unspecified: G47.30

## 2016-06-09 HISTORY — DX: Hypothyroidism, unspecified: E03.9

## 2016-06-09 LAB — SURGICAL PCR SCREEN
MRSA, PCR: NEGATIVE
STAPHYLOCOCCUS AUREUS: NEGATIVE

## 2016-06-09 MED ORDER — CLINDAMYCIN PHOSPHATE 900 MG/50ML IV SOLN
900.0000 mg | Freq: Once | INTRAVENOUS | Status: AC
  Administered 2016-06-10: 900 mg via INTRAVENOUS

## 2016-06-09 NOTE — Anesthesia Preprocedure Evaluation (Deleted)
Anesthesia Evaluation  Patient identified by MRN, date of birth, ID band Patient awake    Reviewed: Allergy & Precautions, NPO status , Patient's Chart, lab work & pertinent test results  Airway Mallampati: III  TM Distance: >3 FB     Dental  (+) Chipped, Caps   Pulmonary sleep apnea , pneumonia, resolved, COPD,  COPD inhaler, former smoker,    Pulmonary exam normal        Cardiovascular negative cardio ROS Normal cardiovascular exam     Neuro/Psych negative neurological ROS  negative psych ROS   GI/Hepatic negative GI ROS, Neg liver ROS,   Endo/Other  Hypothyroidism   Renal/GU negative Renal ROS  negative genitourinary   Musculoskeletal negative musculoskeletal ROS (+)   Abdominal Normal abdominal exam  (+)   Peds negative pediatric ROS (+)  Hematology negative hematology ROS (+)   Anesthesia Other Findings Past Medical History: No date: COPD (chronic obstructive pulmonary disease) (* No date: Hyperlipidemia No date: Hypothyroidism No date: Pneumonia No date: Sleep apnea No date: Thyroid disease   Reproductive/Obstetrics                           Anesthesia Physical Anesthesia Plan  ASA: III  Anesthesia Plan: MAC   Post-op Pain Management:    Induction: Intravenous  Airway Management Planned: Nasal Cannula  Additional Equipment:   Intra-op Plan:   Post-operative Plan:   Informed Consent: I have reviewed the patients History and Physical, chart, labs and discussed the procedure including the risks, benefits and alternatives for the proposed anesthesia with the patient or authorized representative who has indicated his/her understanding and acceptance.     Plan Discussed with: CRNA and Surgeon  Anesthesia Plan Comments:         Anesthesia Quick Evaluation                                   Anesthesia Evaluation    Airway        Dental    Pulmonary sleep apnea , pneumonia, COPD, former smoker,           Cardiovascular      Neuro/Psych    GI/Hepatic   Endo/Other  Hypothyroidism   Renal/GU      Musculoskeletal   Abdominal   Peds  Hematology   Anesthesia Other Findings Past Medical History: No date: COPD (chronic obstructive pulmonary disease) (* No date: Hyperlipidemia No date: Hypothyroidism No date: Pneumonia No date: Sleep apnea No date: Thyroid disease   Reproductive/Obstetrics                             Anesthesia Physical Anesthesia Plan Anesthesia Quick Evaluation                                   Anesthesia Evaluation    Airway        Dental   Pulmonary sleep apnea , pneumonia, COPD, former smoker,           Cardiovascular      Neuro/Psych    GI/Hepatic   Endo/Other  Hypothyroidism   Renal/GU      Musculoskeletal   Abdominal   Peds  Hematology   Anesthesia Other Findings Past Medical History: No date: COPD (chronic obstructive  pulmonary disease) (* No date: Hyperlipidemia No date: Hypothyroidism No date: Pneumonia No date: Sleep apnea No date: Thyroid disease   Reproductive/Obstetrics                             Anesthesia Physical Anesthesia Plan Anesthesia Quick Evaluation

## 2016-06-09 NOTE — Patient Instructions (Signed)
Your procedure is scheduled on: 06/10/16 10:00 am Report to Same Day Surgery 2nd floor medical mall [facility] Entrance-take elevator on left to 2nd floor.  Check in with surgery information desk.)   Remember: Instructions that are not followed completely may result in serious medical risk, up to and including death, or upon the discretion of your surgeon and anesthesiologist your surgery may need to be rescheduled.    _x___ 1. Do not eat food or drink liquids after midnight. No gum chewing or                              hard candies.     __x__ 2. No Alcohol for 24 hours before or after surgery.   __x__3. No Smoking for 24 prior to surgery.   ____  4. Bring all medications with you on the day of surgery if instructed.    __x__ 5. Notify your doctor if there is any change in your medical condition     (cold, fever, infections).     Do not wear jewelry, make-up, hairpins, clips or nail polish.  Do not wear lotions, powders, or perfumes. You may wear deodorant.  Do not shave 48 hours prior to surgery. Men may shave face and neck.  Do not bring valuables to the hospital.    [facility] is not responsible for any belongings or valuables.               Contacts, dentures or bridgework may not be worn into surgery.  Leave your suitcase in the car. After surgery it may be brought to your room.  For patients admitted to the hospital, discharge time is determined by your                       treatment team.   Patients discharged the day of surgery will not be allowed to drive home.  You will need someone to drive you home and stay with you the night of your procedure.    Please read over the following fact sheets that you were given:   [facility] Preparing for Surgery and or MRSA Information   _x___ Take anti-hypertensive (unless it includes a diuretic), cardiac, seizure, asthma,     anti-reflux and psychiatric medicines. These include:  1. albuterol (PROVENTIL   2.levothyroxine  (SYNTHROID,  3.budesonide-formoterol (SYMBICORT  4.  5.  6.  ____Fleets enema or Magnesium Citrate as directed.   _x___ Use CHG Soap or sage wipes as directed on instruction sheet   ____ Use inhalers on the day of surgery and bring to hospital day of surgery  ____ Stop Metformin and Janumet 2 days prior to surgery.    ____ Take 1/2 of usual insulin dose the night before surgery and none on the morning     surgery.   _x___ Follow recommendations from Cardiologist, Pulmonologist or PCP regarding          stopping Aspirin, Coumadin, Pllavix ,Eliquis, Effient, or Pradaxa, and Pletal.  X____Stop Anti-inflammatories such as Advil, Aleve, Ibuprofen, Motrin, Naproxen, Naprosyn, Goodies powders or aspirin products. OK to take Tylenol and                          Celebrex.   _x___ Stop supplements until after surgery.  But may continue Vitamin D, Vitamin B,       and multivitamin.   ____  Bring C-Pap to the hospital.

## 2016-06-10 ENCOUNTER — Encounter: Payer: Self-pay | Admitting: *Deleted

## 2016-06-10 ENCOUNTER — Ambulatory Visit: Payer: BLUE CROSS/BLUE SHIELD | Admitting: Anesthesiology

## 2016-06-10 ENCOUNTER — Ambulatory Visit: Payer: BLUE CROSS/BLUE SHIELD

## 2016-06-10 ENCOUNTER — Encounter
Admission: RE | Disposition: A | Discharge status: Home / Self Care | Payer: Self-pay | Source: Ambulatory Visit | Attending: Orthopedic Surgery

## 2016-06-10 ENCOUNTER — Ambulatory Visit
Admission: RE | Admit: 2016-06-10 | Discharge: 2016-06-10 | Disposition: A | Discharge status: Home / Self Care | Payer: BLUE CROSS/BLUE SHIELD | Source: Ambulatory Visit | Attending: Orthopedic Surgery | Admitting: Orthopedic Surgery

## 2016-06-10 DIAGNOSIS — E785 Hyperlipidemia, unspecified: Secondary | ICD-10-CM | POA: Insufficient documentation

## 2016-06-10 DIAGNOSIS — Z981 Arthrodesis status: Secondary | ICD-10-CM | POA: Insufficient documentation

## 2016-06-10 DIAGNOSIS — Z419 Encounter for procedure for purposes other than remedying health state, unspecified: Secondary | ICD-10-CM

## 2016-06-10 DIAGNOSIS — Z87891 Personal history of nicotine dependence: Secondary | ICD-10-CM | POA: Insufficient documentation

## 2016-06-10 DIAGNOSIS — E039 Hypothyroidism, unspecified: Secondary | ICD-10-CM | POA: Insufficient documentation

## 2016-06-10 DIAGNOSIS — Z7952 Long term (current) use of systemic steroids: Secondary | ICD-10-CM | POA: Insufficient documentation

## 2016-06-10 DIAGNOSIS — M4854XA Collapsed vertebra, not elsewhere classified, thoracic region, initial encounter for fracture: Secondary | ICD-10-CM | POA: Diagnosis not present

## 2016-06-10 DIAGNOSIS — X58XXXA Exposure to other specified factors, initial encounter: Secondary | ICD-10-CM | POA: Insufficient documentation

## 2016-06-10 DIAGNOSIS — Z01818 Encounter for other preprocedural examination: Secondary | ICD-10-CM | POA: Insufficient documentation

## 2016-06-10 DIAGNOSIS — Z79899 Other long term (current) drug therapy: Secondary | ICD-10-CM | POA: Insufficient documentation

## 2016-06-10 DIAGNOSIS — Z880 Allergy status to penicillin: Secondary | ICD-10-CM | POA: Insufficient documentation

## 2016-06-10 DIAGNOSIS — Z7951 Long term (current) use of inhaled steroids: Secondary | ICD-10-CM | POA: Insufficient documentation

## 2016-06-10 DIAGNOSIS — J449 Chronic obstructive pulmonary disease, unspecified: Secondary | ICD-10-CM | POA: Insufficient documentation

## 2016-06-10 DIAGNOSIS — Z79891 Long term (current) use of opiate analgesic: Secondary | ICD-10-CM | POA: Insufficient documentation

## 2016-06-10 DIAGNOSIS — M8088XA Other osteoporosis with current pathological fracture, vertebra(e), initial encounter for fracture: Secondary | ICD-10-CM | POA: Insufficient documentation

## 2016-06-10 HISTORY — PX: KYPHOPLASTY: SHX5884

## 2016-06-10 SURGERY — KYPHOPLASTY
Anesthesia: Monitor Anesthesia Care | Site: Thoracic | Wound class: Clean

## 2016-06-10 MED ORDER — ACETAMINOPHEN 10 MG/ML IV SOLN
INTRAVENOUS | Status: DC | PRN
  Administered 2016-06-10: 1000 mg via INTRAVENOUS

## 2016-06-10 MED ORDER — METOCLOPRAMIDE HCL 10 MG PO TABS: 5.0000 mg | ORAL_TABLET | Freq: Three times a day (TID) | ORAL | Status: DC | PRN

## 2016-06-10 MED ORDER — PROPOFOL 500 MG/50ML IV EMUL
INTRAVENOUS | Status: DC | PRN
  Administered 2016-06-10: 100 ug/kg/min via INTRAVENOUS

## 2016-06-10 MED ORDER — LIDOCAINE HCL (PF) 1 % IJ SOLN
INTRAMUSCULAR | Status: AC
  Filled 2016-06-10: qty 60

## 2016-06-10 MED ORDER — IOPAMIDOL (ISOVUE-M 200) INJECTION 41%
INTRAMUSCULAR | Status: AC
  Filled 2016-06-10: qty 20

## 2016-06-10 MED ORDER — BUPIVACAINE-EPINEPHRINE (PF) 0.5% -1:200000 IJ SOLN
INTRAMUSCULAR | Status: AC
  Filled 2016-06-10: qty 30

## 2016-06-10 MED ORDER — ONDANSETRON HCL 4 MG/2ML IJ SOLN: 4.0000 mg | Freq: Four times a day (QID) | INTRAMUSCULAR | Status: DC | PRN

## 2016-06-10 MED ORDER — LIDOCAINE HCL (PF) 2 % IJ SOLN
INTRAMUSCULAR | Status: AC
  Filled 2016-06-10: qty 2

## 2016-06-10 MED ORDER — LIDOCAINE HCL 1 % IJ SOLN
INTRAMUSCULAR | Status: DC | PRN
  Administered 2016-06-10: 30 mL

## 2016-06-10 MED ORDER — FENTANYL CITRATE (PF) 100 MCG/2ML IJ SOLN
25.0000 ug | INTRAMUSCULAR | Status: DC | PRN
  Administered 2016-06-10 (×4): 25 ug via INTRAVENOUS

## 2016-06-10 MED ORDER — SODIUM CHLORIDE 0.9 % IV SOLN: INTRAVENOUS | Status: DC

## 2016-06-10 MED ORDER — ACETAMINOPHEN 10 MG/ML IV SOLN
INTRAVENOUS | Status: AC
  Filled 2016-06-10: qty 100

## 2016-06-10 MED ORDER — METOCLOPRAMIDE HCL 5 MG/ML IJ SOLN: 5.0000 mg | Freq: Three times a day (TID) | INTRAMUSCULAR | Status: DC | PRN

## 2016-06-10 MED ORDER — LIDOCAINE HCL (CARDIAC) 20 MG/ML IV SOLN
INTRAVENOUS | Status: DC | PRN
  Administered 2016-06-10: 100 mg via INTRAVENOUS

## 2016-06-10 MED ORDER — PROPOFOL 10 MG/ML IV BOLUS
INTRAVENOUS | Status: DC | PRN
  Administered 2016-06-10: 30 mg via INTRAVENOUS
  Administered 2016-06-10: 10 mg via INTRAVENOUS

## 2016-06-10 MED ORDER — MIDAZOLAM HCL 5 MG/5ML IJ SOLN
INTRAMUSCULAR | Status: DC | PRN
  Administered 2016-06-10: 2 mg via INTRAVENOUS

## 2016-06-10 MED ORDER — CLINDAMYCIN PHOSPHATE 900 MG/50ML IV SOLN
INTRAVENOUS | Status: AC
  Filled 2016-06-10: qty 50

## 2016-06-10 MED ORDER — PROPOFOL 500 MG/50ML IV EMUL
INTRAVENOUS | Status: AC
  Filled 2016-06-10: qty 50

## 2016-06-10 MED ORDER — FAMOTIDINE 20 MG PO TABS
ORAL_TABLET | ORAL | Status: AC
  Administered 2016-06-10: 20 mg via ORAL
  Filled 2016-06-10: qty 1

## 2016-06-10 MED ORDER — HYDROCODONE-ACETAMINOPHEN 5-325 MG PO TABS: 1.0000 | ORAL_TABLET | ORAL | Status: DC | PRN

## 2016-06-10 MED ORDER — KETAMINE HCL 50 MG/ML IJ SOLN
INTRAMUSCULAR | Status: DC | PRN
  Administered 2016-06-10: 10 mg via INTRAMUSCULAR
  Administered 2016-06-10: 40 mg via INTRAMUSCULAR

## 2016-06-10 MED ORDER — FAMOTIDINE 20 MG PO TABS
20.0000 mg | ORAL_TABLET | Freq: Once | ORAL | Status: AC
  Administered 2016-06-10: 20 mg via ORAL

## 2016-06-10 MED ORDER — ONDANSETRON HCL 4 MG/2ML IJ SOLN: 4.0000 mg | Freq: Once | INTRAMUSCULAR | Status: DC | PRN

## 2016-06-10 MED ORDER — MIDAZOLAM HCL 2 MG/2ML IJ SOLN
INTRAMUSCULAR | Status: AC
  Filled 2016-06-10: qty 2

## 2016-06-10 MED ORDER — ONDANSETRON HCL 4 MG PO TABS: 4.0000 mg | ORAL_TABLET | Freq: Four times a day (QID) | ORAL | Status: DC | PRN

## 2016-06-10 MED ORDER — HYDROCODONE-ACETAMINOPHEN 5-325 MG PO TABS: 1.0000 | ORAL_TABLET | ORAL | 0 refills | Status: DC | PRN

## 2016-06-10 MED ORDER — BUPIVACAINE-EPINEPHRINE (PF) 0.5% -1:200000 IJ SOLN
INTRAMUSCULAR | Status: DC | PRN
  Administered 2016-06-10: 15 mL

## 2016-06-10 MED ORDER — FENTANYL CITRATE (PF) 100 MCG/2ML IJ SOLN
INTRAMUSCULAR | Status: DC
Start: 2016-06-10 — End: 2016-06-10
  Filled 2016-06-10: qty 2

## 2016-06-10 MED ORDER — LACTATED RINGERS IV SOLN
INTRAVENOUS | Status: DC
  Administered 2016-06-10 (×2): via INTRAVENOUS

## 2016-06-10 SURGICAL SUPPLY — 15 items
CEMENT BONE KYPHON CDS (Cement) ×3 IMPLANT
DEVICE BIOPSY BONE KYPHX (INSTRUMENTS) ×3 IMPLANT
DRAPE C-ARM XRAY 36X54 (DRAPES) ×3 IMPLANT
DURAPREP 26ML APPLICATOR (WOUND CARE) ×3 IMPLANT
GLOVE SURG SYN 9.0  PF PI (GLOVE) ×2
GLOVE SURG SYN 9.0 PF PI (GLOVE) ×1 IMPLANT
GOWN SRG 2XL LVL 4 RGLN SLV (GOWNS) ×1 IMPLANT
GOWN STRL NON-REIN 2XL LVL4 (GOWNS) ×2
GOWN STRL REUS W/ TWL LRG LVL3 (GOWN DISPOSABLE) ×1 IMPLANT
GOWN STRL REUS W/TWL LRG LVL3 (GOWN DISPOSABLE) ×2
LIQUID BAND (GAUZE/BANDAGES/DRESSINGS) ×3 IMPLANT
PACK KYPHOPLASTY (MISCELLANEOUS) ×3 IMPLANT
STRAP SAFETY BODY (MISCELLANEOUS) ×3 IMPLANT
TRAY KYPHOPAK 15/3 EXPRESS 1ST (MISCELLANEOUS) IMPLANT
TRAY KYPHOPAK 20/3 EXPRESS 1ST (MISCELLANEOUS) ×3 IMPLANT

## 2016-06-10 NOTE — Anesthesia Post-op Follow-up Note (Cosign Needed)
Anesthesia QCDR form completed.        

## 2016-06-10 NOTE — Transfer of Care (Signed)
Immediate Anesthesia Transfer of Care Note  Patient: David [name]  Procedure(s) Performed: Procedure(s): KYPHOPLASTY T12 (N/A)  Patient Location: PACU  Anesthesia Type:General  Level of Consciousness: awake, alert , oriented and patient cooperative  Airway & Oxygen Therapy: Patient Spontanous Breathing and Patient connected to nasal cannula oxygen  Post-op Assessment: Report given to RN and Post -op Vital signs reviewed and stable  Post vital signs: Reviewed and stable  Last Vitals:  Vitals:   06/10/16 0848  BP: 129/86  Pulse: 78  Resp: 18  Temp: 36.5 C    Last Pain:  Vitals:   06/10/16 0848  TempSrc: Oral  PainSc: 4          Complications: No apparent anesthesia complications

## 2016-06-10 NOTE — Discharge Instructions (Addendum)
Take it easy today with minimal activities. Resume or normal activities tomorrow as tolerated. Remove Band-Aid on Saturday then okay to shower in the Band-Aid off. Pain medicine as directed   AMBULATORY SURGERY  DISCHARGE INSTRUCTIONS   1) The drugs that you were given will stay in your system until tomorrow so for the next 24 hours you should not:  A) Drive an automobile B) Make any legal decisions C) Drink any alcoholic beverage   2) You may resume regular meals tomorrow.  Today it is better to start with liquids and gradually work up to solid foods.  You may eat anything you prefer, but it is better to start with liquids, then soup and crackers, and gradually work up to solid foods.   3) Please notify your doctor immediately if you have any unusual bleeding, trouble breathing, redness and pain at the surgery site, drainage, fever, or pain not relieved by medication.    4) Additional Instructions:        Please contact your physician with any problems or Same Day Surgery at [phone], Monday through Friday 6 am to 4 pm, or [place] at [facility] number at [phone].

## 2016-06-10 NOTE — Op Note (Signed)
06/10/2016  12:11 PM  PATIENT:  David [name]  61 y.o. male  PRE-OPERATIVE DIAGNOSIS:  THORACIC COMPRESSION FRACTURE T12  POST-OPERATIVE DIAGNOSIS:  THORACIC COMPRESSION FRACTURE T12  PROCEDURE:  Procedure(s): KYPHOPLASTY T12 (N/A)  SURGEON: Laurene Footman, MD  ASSISTANTS: None  ANESTHESIA:   local and MAC  EBL:  Total I/O In: 1000 [I.V.:1000] Out: 2 [Blood:2]  BLOOD ADMINISTERED:none  DRAINS: none   LOCAL MEDICATIONS USED:  MARCAINE    and XYLOCAINE   SPECIMEN:  No Specimen  DISPOSITION OF SPECIMEN:  N/A  COUNTS:  YES  TOURNIQUET:  * No tourniquets in log *  IMPLANTS: Bone cement  DICTATION: .Dragon Dictation patient brought the operating room and after adequate sedation was given the patient was placed prone on the operative table. C-arm was brought in and good visualization of the affected levels obtained in both AP and lateral projections. Based on the radiologist's interpretation of CT and MRI this T12 that was affected although I think it is more corrected of called at L1. Based on the radiology numbering of going on the MRI the affected level was well-visualized in both AP and lateral projections and appropriate patient education timeout procedure completed followed by prepping the skin with alcohol and giving local anesthetic on the right side 10 cc subcutaneously. Back was then prepped and draped in sterile fashion and repeat timeout procedure carried out. Spinal needle was then brought down to the vertebral body pedicle on the right side which pedicle was mostly larger than the left and a total of 30 cc 50-50 mix of 1% Xylocaine have percent Sensorcaine with epinephrine was infiltrated. After allowing this to set a small incision was made and a trocar advanced and  extapedicular fashion into the vertebral body. Biopsy was attempted but no bone came bone was poor quality. Drilling was carried out and then placement of a balloon inflation to 4 and half cc. After this  had been completed and the bone cement was the appropriate consistency the balloon was deflated and removed approximate 6 cc of bone cement infiltrated in the vertebral body without extravasation. After good fill was noted from superior to inferior endplates and both left and right sides the trocar was left in place until the cement had set. When the cement had set the trochars removed and permanent C-arm views obtained. The wound was closed with Dermabond and covered with a Band-Aid  PLAN OF CARE: Discharge to home after PACU  PATIENT DISPOSITION:  PACU - hemodynamically stable.

## 2016-06-10 NOTE — Anesthesia Preprocedure Evaluation (Signed)
Anesthesia Evaluation  Patient identified by MRN, date of birth, ID band Patient awake    Reviewed: Allergy & Precautions, NPO status , Patient's Chart, lab work & pertinent test results  Airway Mallampati: III       Dental  (+) Chipped, Caps   Pulmonary sleep apnea and Continuous Positive Airway Pressure Ventilation , pneumonia, resolved, COPD, former smoker,    Pulmonary exam normal        Cardiovascular Normal cardiovascular exam     Neuro/Psych negative neurological ROS  negative psych ROS   GI/Hepatic negative GI ROS, Neg liver ROS,   Endo/Other  Hypothyroidism   Renal/GU negative Renal ROS  negative genitourinary   Musculoskeletal T12 compression Fx   Abdominal Normal abdominal exam  (+)   Peds negative pediatric ROS (+)  Hematology negative hematology ROS (+)   Anesthesia Other Findings Past Medical History: No date: COPD (chronic obstructive pulmonary disease) (* No date: Hyperlipidemia No date: Hypothyroidism No date: Pneumonia No date: Sleep apnea No date: Thyroid disease  Reproductive/Obstetrics                             Anesthesia Physical Anesthesia Plan  ASA: III  Anesthesia Plan: General and MAC   Post-op Pain Management:    Induction: Intravenous  Airway Management Planned: Nasal Cannula  Additional Equipment:   Intra-op Plan:   Post-operative Plan:   Informed Consent: I have reviewed the patients History and Physical, chart, labs and discussed the procedure including the risks, benefits and alternatives for the proposed anesthesia with the patient or authorized representative who has indicated his/her understanding and acceptance.     Plan Discussed with: CRNA and Surgeon  Anesthesia Plan Comments:         Anesthesia Quick Evaluation

## 2016-06-10 NOTE — H&P (Signed)
Reviewed paper H+P, will be scanned into chart. Patient examined No changes noted.  

## 2016-06-11 NOTE — Anesthesia Postprocedure Evaluation (Signed)
Anesthesia Post Note  Patient: David [name]  Procedure(s) Performed: Procedure(s) (LRB): KYPHOPLASTY T12 (N/A)  Patient location during evaluation: PACU Anesthesia Type: MAC Level of consciousness: awake and alert and oriented Pain management: pain level controlled Vital Signs Assessment: post-procedure vital signs reviewed and stable Respiratory status: spontaneous breathing Cardiovascular status: blood pressure returned to baseline Anesthetic complications: no     Last Vitals:  Vitals:   06/10/16 1323 06/10/16 1402  BP: 125/85 (!) 146/83  Pulse: 81 69  Resp:  16  Temp:      Last Pain:  Vitals:   06/11/16 0923  TempSrc:   PainSc: 4                  Aarav Burgett

## 2016-07-05 ENCOUNTER — Telehealth: Payer: Self-pay | Admitting: *Deleted

## 2016-07-05 ENCOUNTER — Telehealth: Payer: Self-pay | Admitting: Internal Medicine

## 2016-07-05 MED ORDER — BUDESONIDE-FORMOTEROL FUMARATE 160-4.5 MCG/ACT IN AERO: 2.0000 | INHALATION_SPRAY | Freq: Two times a day (BID) | RESPIRATORY_TRACT | 2 refills | Status: DC

## 2016-07-05 NOTE — Telephone Encounter (Signed)
Pt states he needs to speak with someone regarding his symbicort. He also would like a rx for Spiriva. Please call.

## 2016-07-05 NOTE — Telephone Encounter (Signed)
Received fax stating that Symbicort is on pt's list of covered drugs. Called pharmacy and gave them the response I received from insurance. Gave them insurance info we have in the system. States they will ask pt for updated card. Nothing further needed at this time.

## 2016-07-05 NOTE — Telephone Encounter (Signed)
Initiated PA for Symbicort thru CMM. Key: [id number]  Pt ID: [id number]  Will await response.

## 2016-07-05 NOTE — Telephone Encounter (Signed)
symbicort has been sent to the pharmacy. Pt states he is leaving to go out of town tonight and will call Dr. Olin Pia office to see if he will switch his [place] to [place] since his insurance no longer covers [place] Will call back if he wants me to forward the message. Nothing further needed at this time.

## 2016-08-19 ENCOUNTER — Ambulatory Visit (INDEPENDENT_AMBULATORY_CARE_PROVIDER_SITE_OTHER): Payer: BLUE CROSS/BLUE SHIELD | Admitting: Internal Medicine

## 2016-08-19 ENCOUNTER — Encounter: Payer: Self-pay | Admitting: Internal Medicine

## 2016-08-19 VITALS — BP 112/76 | HR 88 | Resp 16 | Ht 69.5 in | Wt 207.0 lb

## 2016-08-19 DIAGNOSIS — M51369 Other intervertebral disc degeneration, lumbar region without mention of lumbar back pain or lower extremity pain: Secondary | ICD-10-CM | POA: Insufficient documentation

## 2016-08-19 DIAGNOSIS — J9611 Chronic respiratory failure with hypoxia: Secondary | ICD-10-CM

## 2016-08-19 DIAGNOSIS — E785 Hyperlipidemia, unspecified: Secondary | ICD-10-CM | POA: Insufficient documentation

## 2016-08-19 DIAGNOSIS — M5136 Other intervertebral disc degeneration, lumbar region: Secondary | ICD-10-CM | POA: Insufficient documentation

## 2016-08-19 DIAGNOSIS — E039 Hypothyroidism, unspecified: Secondary | ICD-10-CM | POA: Insufficient documentation

## 2016-08-19 MED ORDER — TIOTROPIUM BROMIDE MONOHYDRATE 2.5 MCG/ACT IN AERS: 2.0000 | INHALATION_SPRAY | Freq: Every day | RESPIRATORY_TRACT | 5 refills | Status: DC

## 2016-08-19 NOTE — Patient Instructions (Signed)
Change to Respimat Inhaler Handicap paper work filled out Continue symbicort Follow up with Orthopedic surgeon

## 2016-08-19 NOTE — Progress Notes (Signed)
[facility] Consultation      Date: 08/19/2016,   MRN# [id number] Rodert Hinch III 01/10/1956 Code Status:  Code Status History    This patient does not have a recorded code status. Please follow your organizational policy for patients in this situation.     Hosp day:@LENGTHOFSTAYDAYS @ Referring MD: @ATDPROV @     PCP:    Armistead Sult III is a 61 y.o. old male seen in consultation for COPD at the request of  Self referral.  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  albuterol (PROVENTIL HFA;VENTOLIN HFA) 108 (90 Base) MCG/ACT inhaler, Inhale 2 puffs into the lungs every 4 (four) hours as needed for wheezing or shortness of breath., Disp: , Rfl:  .  budesonide-formoterol (SYMBICORT) 160-4.5 MCG/ACT inhaler, Inhale 2 puffs into the lungs 2 (two) times daily., Disp: 3 Inhaler, Rfl: 2 .  HYDROcodone-acetaminophen (NORCO) 5-325 MG tablet, Take 1 tablet by mouth every 4 (four) hours as needed for moderate pain., Disp: 30 tablet,  Rfl: 0 .  levothyroxine (SYNTHROID, LEVOTHROID) 112 MCG tablet, Take 112 mcg by mouth daily before breakfast., Disp: , Rfl:  .  montelukast (SINGULAIR) 10 MG tablet, Take 10 mg by mouth at bedtime., Disp: , Rfl:  .  Rosuvastatin Calcium (CRESTOR PO), Take 1 capsule by mouth daily., Disp: , Rfl:     ALLERGIES   Penicillins     REVIEW OF SYSTEMS   Review of Systems  Constitutional: Negative for chills, diaphoresis, fever, malaise/fatigue and weight loss.  HENT: Negative for congestion and hearing loss.   Respiratory: Positive for shortness of breath. Negative for cough, hemoptysis, sputum production and wheezing.   Cardiovascular: Negative for chest pain, palpitations, orthopnea and leg swelling.  Gastrointestinal: Negative for heartburn and nausea.  Skin: Negative for rash.  Neurological: Negative for weakness and headaches.  Psychiatric/Behavioral: Negative for depression.  All other systems reviewed and are negative.     PHYSICAL EXAM  Physical Exam  Constitutional: He is oriented to person, place, and time. He appears well-developed and well-nourished. No distress.  Cardiovascular: Normal rate, regular rhythm and normal heart sounds.   No murmur heard. Pulmonary/Chest: No respiratory distress. He has no wheezes. He has no rales.  Musculoskeletal: Normal range of motion. He exhibits no edema.  Neurological: He is alert and oriented to person, place, and time. No cranial nerve deficit.  Skin: Skin is warm. He is not diaphoretic.  Psychiatric: He has a normal mood and affect.  CT chest 12/2015  Emphysematous changes, pulmonary scarring and patchy areas of peribronchial thickening and minimal bronchiectasis.   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 His OSA is well treated and doing well overall.   1.continue symbicort (LABA/IC) and Spiriva 2.5  Respimat(AC), albuterol as needed 2.AUTO CPAP therapy 7-18 cm h20 3.referral to pulmonary rehab if insurance covers well 4.continue oxygen as prescribed 5.needs handicap assessment, paper work pending   Follow up in 6 months    Patient satisfied with Plan of action and management. All questions answered  [name], M.D.  [facility]  Medical Director [place] Director [facility] Cardio-Pulmonary Department

## 2016-08-25 DIAGNOSIS — R911 Solitary pulmonary nodule: Secondary | ICD-10-CM | POA: Insufficient documentation

## 2016-12-11 ENCOUNTER — Encounter: Payer: Self-pay | Admitting: Internal Medicine

## 2016-12-13 ENCOUNTER — Other Ambulatory Visit: Payer: Self-pay | Admitting: *Deleted

## 2016-12-13 MED ORDER — ALBUTEROL SULFATE HFA 108 (90 BASE) MCG/ACT IN AERS: 2.0000 | INHALATION_SPRAY | RESPIRATORY_TRACT | 2 refills | Status: DC | PRN

## 2016-12-15 DIAGNOSIS — E538 Deficiency of other specified B group vitamins: Secondary | ICD-10-CM | POA: Insufficient documentation

## 2017-02-14 ENCOUNTER — Telehealth: Payer: Self-pay | Admitting: Internal Medicine

## 2017-02-14 NOTE — Telephone Encounter (Signed)
Error

## 2017-02-16 ENCOUNTER — Telehealth: Payer: Self-pay | Admitting: Internal Medicine

## 2017-02-16 ENCOUNTER — Encounter: Payer: Self-pay | Admitting: Internal Medicine

## 2017-02-16 NOTE — Telephone Encounter (Signed)
Pt calling stating he needs a PFT done before he has Colonoscopy  Dr Tiffany Kocher is most likely going to be doing the procedure he is with [facility]   Please advise.

## 2017-02-17 ENCOUNTER — Encounter: Payer: Self-pay | Admitting: Internal Medicine

## 2017-02-17 ENCOUNTER — Ambulatory Visit (INDEPENDENT_AMBULATORY_CARE_PROVIDER_SITE_OTHER): Payer: BLUE CROSS/BLUE SHIELD | Admitting: Internal Medicine

## 2017-02-17 VITALS — BP 110/84 | HR 77 | Resp 16 | Ht 69.5 in | Wt 200.0 lb

## 2017-02-17 DIAGNOSIS — J449 Chronic obstructive pulmonary disease, unspecified: Secondary | ICD-10-CM | POA: Diagnosis not present

## 2017-02-17 MED ORDER — THEOPHYLLINE ER 200 MG PO TB12: 200.0000 mg | ORAL_TABLET | Freq: Two times a day (BID) | ORAL | 5 refills | Status: DC

## 2017-02-17 NOTE — Patient Instructions (Addendum)
--  Will start theophylline start the medication the day after your procedure.  We will order a theophylline blood level 1 month after starting, follow-up with Dr. Mortimer Fries after that test.

## 2017-02-17 NOTE — Telephone Encounter (Signed)
Pt had f/u scheduled 11/26 with [facility]. Informed pt he would need office visit for ins to cover PFT. Appt changed till today with DR. Nothing further needed.

## 2017-02-17 NOTE — Progress Notes (Signed)
[facility] Consultation      Date: 02/17/2017,   MRN# [id number] David [name] Mar 31, 1956    David [name] is a 61 y.o. old male with severe COPD and obstructive sleep apnea.    Synopsis estabished care in 2017 for Severe COPD PFT's 10/2015 Fev1 28% 1.03L  6MWT WNL 95% o2 sat at 1083 feet ONO +for hypoxia on oxygen   CHIEF COMPLAINT:  Follow up SOB and DOE Follow up COPD and OSA SOB  HISTORY OF PRESENT ILLNESS   61 yo white male dx with COPD in 1998 H/o tobacco use 1 ppd for 15 years  He normally sees Dr. Mortimer Fries, he is here for clearance for a colonoscopy.  He was sent for a PFT for clearance, however this required preauthorization and office visit. He feels that he is no longer snoring, and is not sleeping during the day. He does feel that his mouth is very dry with bad taste in the morning. Has tried biotene mouthwash but did not make much difference.  He feels that he gets winded with activity, such as playing golf. He does ok, but he notes that he may not be able to keep up with people, and he is struggling to finish the day. He had shoulder surgery and it took some time to recover, about 2.5 hours. He is currently using spiriva respimat twice per day, and symbicort 2 puffs twice per day and feels like they have helped.   He looked into joining pulmonary rehabilitation, but it was too expensive.  Review of the patient's download for 3 days as of 02/16/17, shows usage greater than 4 hours is 28 days.  Average usage on days used 6 hours 12 minutes, the patient is on AutoSet 7-18.  95th percentile pressure is 12 with a median of 8 maximum of 14.  Residual AHI 0.2 showing excellent response.    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  albuterol (PROVENTIL HFA;VENTOLIN HFA) 108 (90 Base) MCG/ACT inhaler, Inhale 2 puffs into the lungs every 4 (four) hours as needed for wheezing or shortness of breath., Disp: 1 Inhaler, Rfl: 2 .  budesonide-formoterol (SYMBICORT) 160-4.5 MCG/ACT inhaler, Inhale 2 puffs into the lungs 2 (two) times daily., Disp: 3 Inhaler, Rfl: 2 .  cyclobenzaprine (FLEXERIL) 10 MG tablet, Take 10 mg by mouth daily as needed., Disp: , Rfl:  .  HYDROcodone-acetaminophen (NORCO) 5-325 MG tablet, Take 1 tablet by mouth every 4 (four) hours as needed for moderate pain., Disp: 30 tablet, Rfl: 0 .  levothyroxine (SYNTHROID, LEVOTHROID) 112 MCG tablet, Take 112 mcg by mouth daily before breakfast., Disp: , Rfl:  .  meloxicam (MOBIC) 7.5 MG tablet, Take 705 mg by mouth as needed., Disp: , Rfl:  .  montelukast (SINGULAIR) 10 MG tablet, Take 10 mg by mouth at bedtime., Disp: , Rfl:  .  Rosuvastatin Calcium (CRESTOR PO),  Take 1 capsule by mouth daily., Disp: , Rfl:  .  Tiotropium Bromide Monohydrate (SPIRIVA RESPIMAT) 2.5 MCG/ACT AERS, Inhale 2 puffs into the lungs daily., Disp: 1 Inhaler, Rfl: 5    ALLERGIES   Penicillins     REVIEW OF SYSTEMS   Review of Systems  Constitutional: Negative for chills, diaphoresis, fever, malaise/fatigue and weight loss.  HENT: Negative for congestion and hearing loss.   Respiratory: Positive for shortness of breath. Negative for cough, hemoptysis, sputum production and wheezing.   Cardiovascular: Negative for chest pain, palpitations, orthopnea and leg swelling.  Gastrointestinal: Negative for heartburn and  nausea.  Skin: Negative for rash.  Neurological: Negative for weakness and headaches.  Psychiatric/Behavioral: Negative for depression.  All other systems reviewed and are negative.     PHYSICAL EXAM  Physical Exam  Constitutional: He is oriented to person, place, and time. He appears well-developed and well-nourished. No distress.  Eyes: EOM are normal.  Cardiovascular: Normal rate, regular rhythm and normal heart sounds.  No murmur heard. Pulmonary/Chest: No stridor. No respiratory distress. He has no wheezes. He has no rales.  Abdominal: Soft.  Musculoskeletal: Normal range of motion. He exhibits no edema.  Neurological: He is alert and oriented to person, place, and time. No cranial nerve deficit.  Skin: Skin is warm. He is not diaphoretic.  Psychiatric: He has a normal mood and affect.    CT chest 12/2015  Emphysematous changes, pulmonary scarring and patchy areas of peribronchial thickening and minimal bronchiectasis.   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 His OSA is well treated and doing well overall.   1.continue symbicort (LABA/IC) and Tudorza(AC), albuterol as needed Patient feels that his exertional dyspnea is getting progressively worse, we discussed that he is already on maximal therapy. -We will start theophylline, I have asked him to check a theophylline level in 1 month and follow-up with Dr. Mortimer Fries after.  -Patient does not improve consideration could be given to referral for lung transplantation evaluation. 2.AUTO CPAP therapy CPAP 7-18 cm h20 - Patient is noted significant dry mouth and bad taste with use.  This is not improved with biotin mouthwash.  Discussed potentially decreasing the pressure however he feels that this is something that he can live with therefore no changes will be made at this time.  If symptoms continue could consider  decreasing pressure range, as well as empiric treatment for GERD. 3.referral to pulmonary rehab if insurance covers well - Was not covered by insurance, discussed that once he is enrolled in Medicare in a few years this should be covered and can refer again at that time.  Follow up in 3 months   Deep Ashby Dawes, MD.   Board Certified in Internal Medicine, Pulmonary Medicine, [place], and Sleep Medicine.  [facility] Office Number: [phone] Pager: [phone]  Patricia Pesa, M.D.  Merton Border, M.D

## 2017-02-18 ENCOUNTER — Telehealth: Payer: Self-pay | Admitting: Internal Medicine

## 2017-02-18 ENCOUNTER — Other Ambulatory Visit
Admission: RE | Admit: 2017-02-18 | Discharge: 2017-02-18 | Disposition: A | Discharge status: Home / Self Care | Payer: BLUE CROSS/BLUE SHIELD | Source: Ambulatory Visit | Attending: Internal Medicine | Admitting: Internal Medicine

## 2017-02-18 DIAGNOSIS — J449 Chronic obstructive pulmonary disease, unspecified: Secondary | ICD-10-CM

## 2017-02-18 NOTE — Telephone Encounter (Signed)
Pt calling stating he was told we were going to call him back He would like to know if he truly needs to do PFT on Tuesday   Please advise

## 2017-02-18 NOTE — Telephone Encounter (Signed)
Waiting to hear back from [facility] to see if pt needs PFT for clearance or if he can proceed without it.

## 2017-02-18 NOTE — Telephone Encounter (Signed)
Returned call to patient and let him know risk assessment form was faxed to [facility]. A message was left for [facility] Dr. Elliott's nurse to call back and inform if he still wanted to proceed with pft After Dr. Ashby Dawes cleared giving clearance without PFT being necessary.  Pt informed that he can call [facility] and get a direct answer himself because PFT was scheduled for 11/20 but he rescheduled for 11/17 which is tomorrow. Pt states he will call and find out and proceed from there. If PFT not needed he will cancel. Nothing further needed.

## 2017-02-19 LAB — THEOPHYLLINE LEVEL

## 2017-02-22 ENCOUNTER — Ambulatory Visit: Payer: BLUE CROSS/BLUE SHIELD

## 2017-02-28 ENCOUNTER — Encounter: Payer: Self-pay | Admitting: Internal Medicine

## 2017-02-28 ENCOUNTER — Ambulatory Visit (INDEPENDENT_AMBULATORY_CARE_PROVIDER_SITE_OTHER): Payer: BLUE CROSS/BLUE SHIELD | Admitting: Internal Medicine

## 2017-02-28 VITALS — BP 136/84 | HR 92 | Ht 69.5 in | Wt 202.0 lb

## 2017-02-28 DIAGNOSIS — J449 Chronic obstructive pulmonary disease, unspecified: Secondary | ICD-10-CM

## 2017-02-28 NOTE — Patient Instructions (Signed)
Continue inhalers as prescribed 

## 2017-02-28 NOTE — Progress Notes (Signed)
[facility] Consultation      Date: 02/28/2017,   MRN# [id number] David [name] 1955-09-18 Code Status:  Code Status History    This patient does not have a recorded code status. Please follow your organizational policy for patients in this situation.     Hosp day:@LENGTHOFSTAYDAYS @ Referring MD: @ATDPROV @     PCP:    David [name] is a 61 y.o. old male seen in consultation for COPD at the request of  Self referral.  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  albuterol (PROVENTIL HFA;VENTOLIN HFA) 108 (90 Base) MCG/ACT inhaler, Inhale 2 puffs into the lungs every 4 (four) hours as needed for wheezing or shortness of breath., Disp: 1 Inhaler, Rfl: 2 .  budesonide-formoterol (SYMBICORT) 160-4.5 MCG/ACT inhaler, Inhale 2 puffs into the lungs 2 (two) times daily., Disp: 3 Inhaler, Rfl: 2 .  levothyroxine (SYNTHROID, LEVOTHROID) 112 MCG tablet, Take 112 mcg by mouth daily  before breakfast., Disp: , Rfl:  .  montelukast (SINGULAIR) 10 MG tablet, Take 10 mg by mouth at bedtime., Disp: , Rfl:  .  Rosuvastatin Calcium (CRESTOR PO), Take 1 capsule by mouth daily., Disp: , Rfl:  .  theophylline (THEODUR) 200 MG 12 hr tablet, Take 1 tablet (200 mg total) 2 (two) times daily by mouth. Start the day after your procedure., Disp: 60 tablet, Rfl: 5 .  Tiotropium Bromide Monohydrate (SPIRIVA RESPIMAT) 2.5 MCG/ACT AERS, Inhale 2 puffs into the lungs daily., Disp: 1 Inhaler, Rfl: 5    ALLERGIES   Penicillins     REVIEW OF SYSTEMS   Review of Systems  Constitutional: Negative for chills, diaphoresis, fever, malaise/fatigue and weight loss.  HENT: Negative for congestion and hearing loss.   Respiratory: Positive for shortness of breath. Negative for cough, hemoptysis, sputum production and wheezing.   Cardiovascular: Negative for chest pain, palpitations, orthopnea and leg swelling.  Gastrointestinal: Negative for heartburn and nausea.  Skin: Negative for rash.  Neurological: Negative for weakness and headaches.  Psychiatric/Behavioral: Negative for depression.  All other systems reviewed and are negative.     PHYSICAL EXAM  Physical Exam  Constitutional: He is oriented to person, place, and time. He appears well-developed and well-nourished. No distress.  Cardiovascular: Normal rate, regular rhythm and normal heart sounds.  No murmur heard. Pulmonary/Chest: No respiratory distress. He has no wheezes. He has no rales.  Musculoskeletal: Normal range of motion. He  exhibits no edema.  Neurological: He is alert and oriented to person, place, and time. No cranial nerve deficit.  Skin: Skin is warm. He is not diaphoretic.  Psychiatric: He has a normal mood and affect.    CT chest 12/2015  Emphysematous changes, pulmonary scarring and patchy areas of peribronchial thickening and minimal bronchiectasis.   ASSESSMENT/PLAN   61 yo white male with Very severe  COPD with chronic hypoxic resp failure Gold Stage D, patient resp status at baseline is poor, With OSA. His OSA is well treated and doing well overall.  However, patient feels much better with Tudorza  COPD 1.continue symbicort (LABA/IC) and Spiriva, albuterol as needed Patient feels that his exertional dyspnea is getting progressively worse, we discussed that he is already on maximal therapy. Will discuss THEO therapy at next visit Patient drinks 2-4 beers per day and I thought that this would interfere with THEOPHYLINE therapy  OSA 2.AUTO CPAP therapy CPAP 7-18 cm h20 -patient doing well with compliance    Patient satisfied with Plan of action and management. All questions answered  [name], M.D.  [facility]  Medical Director [place] Director [facility] Cardio-Pulmonary Department

## 2017-03-04 ENCOUNTER — Encounter: Payer: Self-pay | Admitting: *Deleted

## 2017-03-07 ENCOUNTER — Ambulatory Visit: Payer: BLUE CROSS/BLUE SHIELD | Admitting: Certified Registered"

## 2017-03-07 ENCOUNTER — Encounter
Admission: RE | Disposition: A | Discharge status: Home / Self Care | Payer: Self-pay | Source: Ambulatory Visit | Attending: Unknown Physician Specialty

## 2017-03-07 ENCOUNTER — Ambulatory Visit
Admission: RE | Admit: 2017-03-07 | Discharge: 2017-03-07 | Disposition: A | Discharge status: Home / Self Care | Payer: BLUE CROSS/BLUE SHIELD | Source: Ambulatory Visit | Attending: Unknown Physician Specialty | Admitting: Unknown Physician Specialty

## 2017-03-07 DIAGNOSIS — K641 Second degree hemorrhoids: Secondary | ICD-10-CM | POA: Insufficient documentation

## 2017-03-07 DIAGNOSIS — Z87891 Personal history of nicotine dependence: Secondary | ICD-10-CM | POA: Insufficient documentation

## 2017-03-07 DIAGNOSIS — E039 Hypothyroidism, unspecified: Secondary | ICD-10-CM | POA: Diagnosis not present

## 2017-03-07 DIAGNOSIS — E785 Hyperlipidemia, unspecified: Secondary | ICD-10-CM | POA: Insufficient documentation

## 2017-03-07 DIAGNOSIS — G4730 Sleep apnea, unspecified: Secondary | ICD-10-CM | POA: Insufficient documentation

## 2017-03-07 DIAGNOSIS — D122 Benign neoplasm of ascending colon: Secondary | ICD-10-CM | POA: Diagnosis not present

## 2017-03-07 DIAGNOSIS — Z9989 Dependence on other enabling machines and devices: Secondary | ICD-10-CM | POA: Diagnosis not present

## 2017-03-07 DIAGNOSIS — J449 Chronic obstructive pulmonary disease, unspecified: Secondary | ICD-10-CM | POA: Insufficient documentation

## 2017-03-07 DIAGNOSIS — Z1211 Encounter for screening for malignant neoplasm of colon: Secondary | ICD-10-CM | POA: Insufficient documentation

## 2017-03-07 DIAGNOSIS — K635 Polyp of colon: Secondary | ICD-10-CM | POA: Insufficient documentation

## 2017-03-07 DIAGNOSIS — Z79899 Other long term (current) drug therapy: Secondary | ICD-10-CM | POA: Diagnosis not present

## 2017-03-07 HISTORY — DX: Idiopathic sleep related nonobstructive alveolar hypoventilation: G47.34

## 2017-03-07 HISTORY — DX: Other nonspecific abnormal finding of lung field: R91.8

## 2017-03-07 HISTORY — DX: Complete rotator cuff tear or rupture of right shoulder, not specified as traumatic: M75.121

## 2017-03-07 HISTORY — DX: Deficiency of other specified B group vitamins: E53.8

## 2017-03-07 HISTORY — DX: Other intervertebral disc degeneration, lumbar region: M51.36

## 2017-03-07 HISTORY — DX: Thrombocytopenia, unspecified: D69.6

## 2017-03-07 HISTORY — DX: Collapsed vertebra, not elsewhere classified, site unspecified, initial encounter for fracture: M48.50XA

## 2017-03-07 HISTORY — DX: Other intervertebral disc degeneration, lumbar region without mention of lumbar back pain or lower extremity pain: M51.369

## 2017-03-07 LAB — CBC WITH DIFFERENTIAL/PLATELET
BASOS PCT: 1 %
Basophils Absolute: 0 10*3/uL (ref 0–0.1)
EOS PCT: 1 %
Eosinophils Absolute: 0.1 10*3/uL (ref 0–0.7)
HEMATOCRIT: 43 % (ref 40.0–52.0)
Hemoglobin: 14.6 g/dL (ref 13.0–18.0)
LYMPHS PCT: 22 %
Lymphs Abs: 1.3 10*3/uL (ref 1.0–3.6)
MCH: 34.1 pg — ABNORMAL HIGH (ref 26.0–34.0)
MCHC: 33.9 g/dL (ref 32.0–36.0)
MCV: 100.3 fL — AB (ref 80.0–100.0)
MONO ABS: 0.4 10*3/uL (ref 0.2–1.0)
MONOS PCT: 7 %
NEUTROS ABS: 4 10*3/uL (ref 1.4–6.5)
Neutrophils Relative %: 69 %
Platelets: 81 10*3/uL — ABNORMAL LOW (ref 150–440)
RBC: 4.29 MIL/uL — ABNORMAL LOW (ref 4.40–5.90)
RDW: 13.2 % (ref 11.5–14.5)
WBC: 5.8 10*3/uL (ref 3.8–10.6)

## 2017-03-07 SURGERY — COLONOSCOPY WITH PROPOFOL
Anesthesia: General

## 2017-03-07 MED ORDER — PROPOFOL 500 MG/50ML IV EMUL
INTRAVENOUS | Status: DC | PRN
  Administered 2017-03-07: 120 ug/kg/min via INTRAVENOUS

## 2017-03-07 MED ORDER — PROPOFOL 10 MG/ML IV BOLUS
INTRAVENOUS | Status: DC | PRN
  Administered 2017-03-07: 20 mg via INTRAVENOUS
  Administered 2017-03-07: 50 mg via INTRAVENOUS
  Administered 2017-03-07: 20 mg via INTRAVENOUS

## 2017-03-07 MED ORDER — LIDOCAINE 2% (20 MG/ML) 5 ML SYRINGE
INTRAMUSCULAR | Status: DC | PRN
  Administered 2017-03-07: 25 mg via INTRAVENOUS

## 2017-03-07 MED ORDER — MIDAZOLAM HCL 2 MG/2ML IJ SOLN
INTRAMUSCULAR | Status: AC
  Filled 2017-03-07: qty 2

## 2017-03-07 MED ORDER — SODIUM CHLORIDE 0.9 % IV SOLN
INTRAVENOUS | Status: DC
  Administered 2017-03-07: 13:00:00 via INTRAVENOUS

## 2017-03-07 MED ORDER — MIDAZOLAM HCL 5 MG/5ML IJ SOLN
INTRAMUSCULAR | Status: DC | PRN
  Administered 2017-03-07: 2 mg via INTRAVENOUS

## 2017-03-07 NOTE — Op Note (Signed)
[facility] Gastroenterology Patient Name: David [name] [facility] Procedure Date: 03/07/2017 12:47 PM MRN: [id number] Account #: [id number] Date of Birth: [birth year] Admit Type: Outpatient Age: 61 Room: [facility] ENDO ROOM 1 Gender: Male Note Status: Finalized Procedure:            Colonoscopy Indications:          Screening for colorectal malignant neoplasm Providers:            Manya Silvas, MD Referring MD:         Ramonita Lab, MD (Referring MD) Medicines:            Propofol per Anesthesia Complications:        No immediate complications. Procedure:            Pre-Anesthesia Assessment:                       - After reviewing the risks and benefits, the patient                        was deemed in satisfactory condition to undergo the                        procedure.                       After obtaining informed consent, the colonoscope was                        passed under direct vision. Throughout the procedure,                        the patient's blood pressure, pulse, and oxygen                        saturations were monitored continuously. The                        Colonoscope was introduced through the anus and                        advanced to the the cecum, identified by appendiceal                        orifice and ileocecal valve. The colonoscopy was                        performed without difficulty. The patient tolerated the                        procedure well. The quality of the bowel preparation                        was excellent. Findings:      A diminutive polyp was found in the ascending colon. The polyp was       sessile. The polyp was removed with a jumbo cold forceps. Resection and       retrieval were complete.      A diminutive polyp was found in the recto-sigmoid colon. The polyp was       sessile. The polyp was removed with a jumbo cold forceps. Resection and  retrieval were complete.      Internal hemorrhoids were found  during endoscopy. The hemorrhoids were       small and Grade I (internal hemorrhoids that do not prolapse).      The exam was otherwise without abnormality. Impression:           - One diminutive polyp in the ascending colon, removed                        with a jumbo cold forceps. Resected and retrieved.                       - One diminutive polyp at the recto-sigmoid colon,                        removed with a jumbo cold forceps. Resected and                        retrieved.                       - Internal hemorrhoids.                       - The examination was otherwise normal. Recommendation:       - Await pathology results. Manya Silvas, MD 03/07/2017 1:21:24 PM This report has been signed electronically. Number of Addenda: 0 Note Initiated On: 03/07/2017 12:47 PM Scope Withdrawal Time: 0 hours 6 minutes 52 seconds  Total Procedure Duration: 0 hours 12 minutes 16 seconds       [facility]

## 2017-03-07 NOTE — Anesthesia Post-op Follow-up Note (Signed)
Anesthesia QCDR form completed.        

## 2017-03-07 NOTE — H&P (Signed)
Primary Care Physician:  Adin Hector, MD Primary Gastroenterologist:  Dr. Vira Agar  Pre-Procedure History & Physical: HPI:  David [name] is a 61 y.o. male is here for an colonoscopy.   Past Medical History:  Diagnosis Date  . B12 deficiency   . Complete tear of right rotator cuff   . Compression fracture of spine ([place])   . COPD (chronic obstructive pulmonary disease) ([place])   . DDD (degenerative disc disease), lumbar   . Hyperlipidemia   . Hypothyroidism   . Lung nodule, multiple    3MM. AND 8 MM  . Nocturnal hypoxia   . Pneumonia   . Sleep apnea   . Thrombocytopenia ([place])   . Thyroid disease     Past Surgical History:  Procedure Laterality Date  . BACK SURGERY  1991   L5-S1  . COLONOSCOPY    . ELBOW ARTHROSCOPY Right   . KYPHOPLASTY N/A 06/10/2016   Procedure: KYPHOPLASTY T12;  Surgeon: Hessie Knows, MD;  Location: [facility] ORS;  Service: Orthopedics;  Laterality: N/A;  . KYPHOPLASTY     T12  . SHOULDER ARTHROSCOPY    . TENODESIS LONG TENDON BICEPS Right   . TONSILLECTOMY      Prior to Admission medications   Medication Sig Start Date End Date Taking? Authorizing Provider  albuterol (PROVENTIL HFA;VENTOLIN HFA) 108 (90 Base) MCG/ACT inhaler Inhale 2 puffs into the lungs every 4 (four) hours as needed for wheezing or shortness of breath. 12/13/16  Yes Kasa, Maretta Bees, MD  budesonide-formoterol (SYMBICORT) 160-4.5 MCG/ACT inhaler Inhale 2 puffs into the lungs 2 (two) times daily. 07/05/16  Yes Flora Lipps, MD  co-enzyme Q-10 30 MG capsule Take 30 mg by mouth 3 (three) times daily.   Yes [provider]  levothyroxine (SYNTHROID, LEVOTHROID) 112 MCG tablet Take 112 mcg by mouth daily before breakfast.   Yes [provider]  montelukast (SINGULAIR) 10 MG tablet Take 10 mg by mouth at bedtime.   Yes [provider]  omega-3 acid ethyl esters (LOVAZA) 1 g capsule Take 1 g by mouth 2 (two) times daily.   Yes [provider]  Rosuvastatin  Calcium (CRESTOR PO) Take 1 capsule by mouth daily.   Yes [provider]  theophylline (THEODUR) 200 MG 12 hr tablet Take 1 tablet (200 mg total) 2 (two) times daily by mouth. Start the day after your procedure. 02/17/17  Yes Laverle Hobby, MD  Tiotropium Bromide Monohydrate (SPIRIVA RESPIMAT) 2.5 MCG/ACT AERS Inhale 2 puffs into the lungs daily. 08/19/16  Yes Flora Lipps, MD  TURMERIC PO Take by mouth.   Yes [provider]    Allergies as of 02/18/2017 - Review Complete 02/17/2017  Allergen Reaction Noted  . Penicillins  10/10/2015    Family History  Family history unknown: Yes    Social History   Socioeconomic History  . Marital status: Married    Spouse name: Not on file  . Number of children: Not on file  . Years of education: Not on file  . Highest education level: Not on file  Social Needs  . Financial resource strain: Not on file  . Food insecurity - worry: Not on file  . Food insecurity - inability: Not on file  . Transportation needs - medical: Not on file  . Transportation needs - non-medical: Not on file  Occupational History  . Not on file  Tobacco Use  . Smoking status: Former Smoker    Last attempt to quit: 06/09/1984  Years since quitting: 32.7  . Smokeless tobacco: Never Used  Substance and Sexual Activity  . Alcohol use: Yes    Alcohol/week: 4.2 oz    Types: 7 Cans of beer per week    Comment: none last 24 hrs  . Drug use: No  . Sexual activity: Not on file  Other Topics Concern  . Not on file  Social History Narrative  . Not on file    Review of Systems: See HPI, otherwise negative ROS  Physical Exam: BP 125/73   Pulse 90   Temp (!) 96.3 F (35.7 C) (Tympanic)   Resp 20   Ht 5\' 10"  (1.778 m)   Wt 88.5 kg (195 lb)   SpO2 100%   BMI 27.98 kg/m  General:   Alert,  pleasant and cooperative in NAD Head:  Normocephalic and atraumatic. Neck:  Supple; no masses or thyromegaly. Lungs:  Clear throughout to  auscultation.    Heart:  Regular rate and rhythm. Abdomen:  Soft, nontender and nondistended. Normal bowel sounds, without guarding, and without rebound.   Neurologic:  Alert and  oriented x4;  grossly normal neurologically.  Impression/Plan: David [name] is here for an colonoscopy to be performed for screening colonoscopy.  Risks, benefits, limitations, and alternatives regarding  colonoscopy have been reviewed with the patient.  Questions have been answered.  All parties agreeable.   Gaylyn Cheers, MD  03/07/2017, 12:47 PM

## 2017-03-07 NOTE — Anesthesia Preprocedure Evaluation (Signed)
Anesthesia Evaluation  Patient identified by MRN, date of birth, ID band Patient awake    Reviewed: Allergy & Precautions, NPO status , Patient's Chart, lab work & pertinent test results  History of Anesthesia Complications Negative for: history of anesthetic complications  Airway Mallampati: III       Dental  (+) Chipped, Caps   Pulmonary shortness of breath and with exertion, sleep apnea and Continuous Positive Airway Pressure Ventilation , pneumonia, resolved, COPD,  COPD inhaler, neg recent URI, former smoker,           Cardiovascular Exercise Tolerance: Good negative cardio ROS       Neuro/Psych negative neurological ROS  negative psych ROS   GI/Hepatic negative GI ROS, Neg liver ROS,   Endo/Other  neg diabetesHypothyroidism   Renal/GU negative Renal ROS  negative genitourinary   Musculoskeletal T12 compression Fx   Abdominal Normal abdominal exam  (+)   Peds negative pediatric ROS (+)  Hematology negative hematology ROS (+)   Anesthesia Other Findings Past Medical History: No date: COPD (chronic obstructive pulmonary disease) (* No date: Hyperlipidemia No date: Hypothyroidism No date: Pneumonia No date: Sleep apnea No date: Thyroid disease  Reproductive/Obstetrics negative OB ROS                             Anesthesia Physical  Anesthesia Plan  ASA: III  Anesthesia Plan: General   Post-op Pain Management:    Induction: Intravenous  PONV Risk Score and Plan: 1 and Propofol infusion  Airway Management Planned: Nasal Cannula  Additional Equipment:   Intra-op Plan:   Post-operative Plan:   Informed Consent: I have reviewed the patients History and Physical, chart, labs and discussed the procedure including the risks, benefits and alternatives for the proposed anesthesia with the patient or authorized representative who has indicated his/her understanding and  acceptance.     Plan Discussed with: CRNA and Surgeon  Anesthesia Plan Comments:         Anesthesia Quick Evaluation

## 2017-03-07 NOTE — OR Nursing (Signed)
Tested positive for cocaine. Procedure cancelled. To reschedule with Dr. Percell [place] office.

## 2017-03-07 NOTE — Transfer of Care (Signed)
Immediate Anesthesia Transfer of Care Note  Patient: Townes Fuhs III  Procedure(s) Performed: COLONOSCOPY WITH PROPOFOL (N/A )  Patient Location: Endoscopy Unit  Anesthesia Type:General  Level of Consciousness: awake and alert   Airway & Oxygen Therapy: Patient Spontanous Breathing and Patient connected to nasal cannula oxygen  Post-op Assessment: Report given to RN and Post -op Vital signs reviewed and stable  Post vital signs: Reviewed  Last Vitals:  Vitals:   03/07/17 1239 03/07/17 1323  BP: 125/73 109/86  Pulse: 90 76  Resp: 20 16  Temp: (!) 35.7 C (!) 36.2 C  SpO2: 100% 99%    Last Pain:  Vitals:   03/07/17 1239  TempSrc: Tympanic         Complications: No apparent anesthesia complications

## 2017-03-08 NOTE — Anesthesia Postprocedure Evaluation (Signed)
Anesthesia Post Note  Patient: David [name]  Procedure(s) Performed: COLONOSCOPY WITH PROPOFOL (N/A )  Patient location during evaluation: Endoscopy Anesthesia Type: General Level of consciousness: awake and alert Pain management: pain level controlled Vital Signs Assessment: post-procedure vital signs reviewed and stable Respiratory status: spontaneous breathing, nonlabored ventilation, respiratory function stable and patient connected to nasal cannula oxygen Cardiovascular status: blood pressure returned to baseline and stable Postop Assessment: no apparent nausea or vomiting Anesthetic complications: no     Last Vitals:  Vitals:   03/07/17 1345 03/07/17 1355  BP: 111/89 125/79  Pulse: 71 68  Resp: 16 17  Temp:    SpO2: 97%     Last Pain:  Vitals:   03/08/17 0804  TempSrc:   PainSc: 0-No pain                 Martha Clan

## 2017-03-09 LAB — SURGICAL PATHOLOGY

## 2017-03-28 ENCOUNTER — Other Ambulatory Visit: Payer: Self-pay | Admitting: Internal Medicine

## 2017-05-09 IMAGING — CR DG THORACIC SPINE 2V
1 series · 1 of 1 positions shown · non-contrast
Comparison: MRI June 07, 2016.

CLINICAL DATA: Kyphoplasty.

EXAM:
DG C-ARM 61-120 MIN; THORACIC SPINE 2 VIEWS
Radiation exposure index:  15.9 mGy.

[cont.]
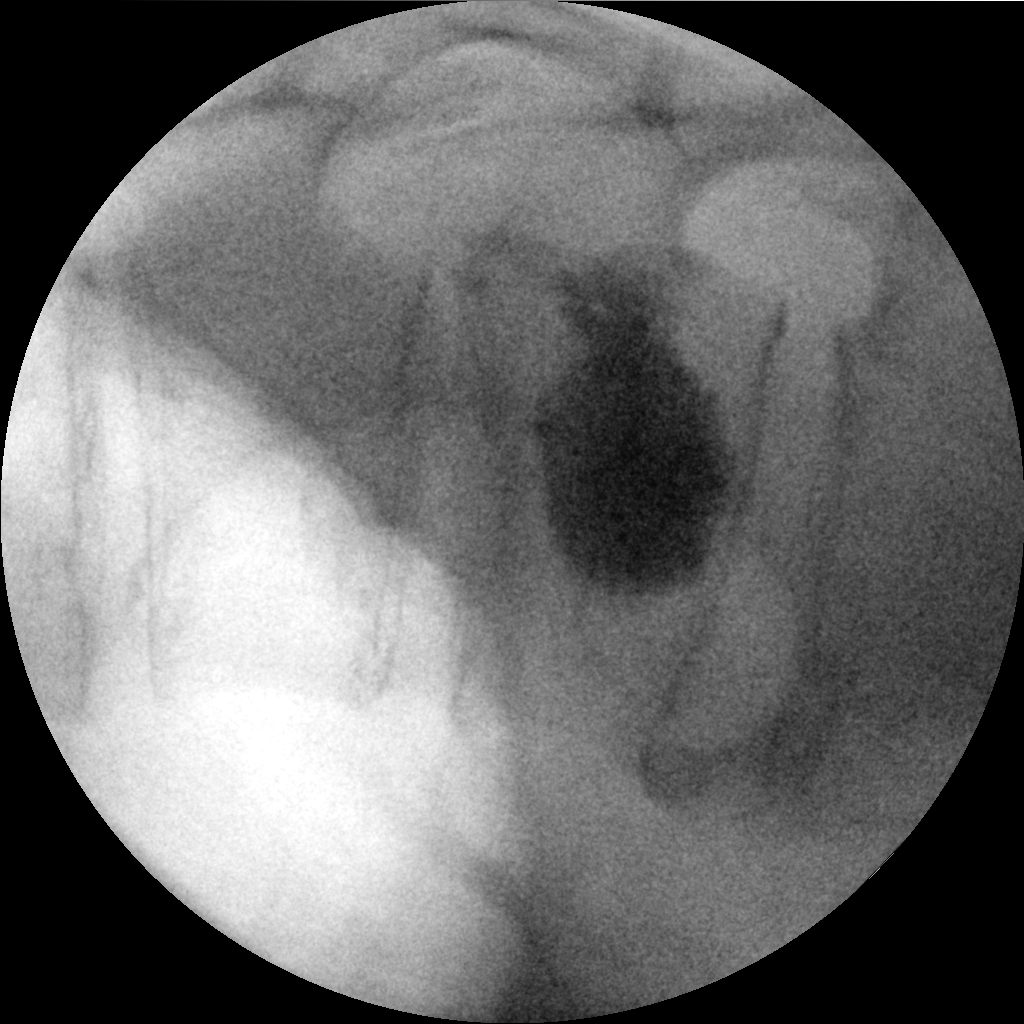

[1 of 1 positions shown; findings below may reference images not displayed]

FINDINGS: Two intraoperative fluoroscopic images of thoracolumbar junction
were obtained. These images demonstrate patient be status post
kyphoplasty of vertebral body in this area. No extravasation of
contrast into spinal canal is noted.
IMPRESSION: Status post kyphoplasty of thoracolumbar vertebral body.

## 2017-05-13 ENCOUNTER — Other Ambulatory Visit: Payer: Self-pay | Admitting: Internal Medicine

## 2017-06-15 ENCOUNTER — Other Ambulatory Visit: Payer: Self-pay | Admitting: Internal Medicine

## 2017-06-15 DIAGNOSIS — R911 Solitary pulmonary nodule: Secondary | ICD-10-CM

## 2017-06-17 ENCOUNTER — Ambulatory Visit
Admission: RE | Admit: 2017-06-17 | Discharge: 2017-06-17 | Disposition: A | Discharge status: Home / Self Care | Payer: BLUE CROSS/BLUE SHIELD | Source: Ambulatory Visit | Attending: Internal Medicine | Admitting: Internal Medicine

## 2017-06-17 DIAGNOSIS — R911 Solitary pulmonary nodule: Secondary | ICD-10-CM | POA: Insufficient documentation

## 2017-06-17 DIAGNOSIS — J479 Bronchiectasis, uncomplicated: Secondary | ICD-10-CM | POA: Diagnosis not present

## 2017-06-17 DIAGNOSIS — J439 Emphysema, unspecified: Secondary | ICD-10-CM | POA: Insufficient documentation

## 2017-06-22 ENCOUNTER — Inpatient Hospital Stay: Payer: BLUE CROSS/BLUE SHIELD

## 2017-06-22 ENCOUNTER — Other Ambulatory Visit: Payer: Self-pay

## 2017-06-22 ENCOUNTER — Encounter: Payer: Self-pay | Admitting: Oncology

## 2017-06-22 ENCOUNTER — Inpatient Hospital Stay: Payer: BLUE CROSS/BLUE SHIELD | Attending: Oncology | Admitting: Oncology

## 2017-06-22 VITALS — BP 150/89 | HR 73 | Temp 97.7°F | Resp 18 | Wt 200.4 lb

## 2017-06-22 DIAGNOSIS — F1010 Alcohol abuse, uncomplicated: Secondary | ICD-10-CM

## 2017-06-22 DIAGNOSIS — Z79899 Other long term (current) drug therapy: Secondary | ICD-10-CM | POA: Diagnosis not present

## 2017-06-22 DIAGNOSIS — D696 Thrombocytopenia, unspecified: Secondary | ICD-10-CM | POA: Diagnosis not present

## 2017-06-22 DIAGNOSIS — E538 Deficiency of other specified B group vitamins: Secondary | ICD-10-CM | POA: Diagnosis not present

## 2017-06-22 DIAGNOSIS — E785 Hyperlipidemia, unspecified: Secondary | ICD-10-CM

## 2017-06-22 DIAGNOSIS — M5136 Other intervertebral disc degeneration, lumbar region: Secondary | ICD-10-CM

## 2017-06-22 DIAGNOSIS — J449 Chronic obstructive pulmonary disease, unspecified: Secondary | ICD-10-CM | POA: Diagnosis not present

## 2017-06-22 DIAGNOSIS — E039 Hypothyroidism, unspecified: Secondary | ICD-10-CM | POA: Diagnosis not present

## 2017-06-22 DIAGNOSIS — Z789 Other specified health status: Secondary | ICD-10-CM

## 2017-06-22 DIAGNOSIS — Z87891 Personal history of nicotine dependence: Secondary | ICD-10-CM | POA: Diagnosis not present

## 2017-06-22 DIAGNOSIS — Z880 Allergy status to penicillin: Secondary | ICD-10-CM | POA: Diagnosis not present

## 2017-06-22 LAB — COMPREHENSIVE METABOLIC PANEL
ALBUMIN: 4 g/dL (ref 3.5–5.0)
ALT: 26 U/L (ref 17–63)
AST: 20 U/L (ref 15–41)
Alkaline Phosphatase: 41 U/L (ref 38–126)
Anion gap: 8 (ref 5–15)
BILIRUBIN TOTAL: 0.8 mg/dL (ref 0.3–1.2)
BUN: 18 mg/dL (ref 6–20)
CALCIUM: 9.4 mg/dL (ref 8.9–10.3)
CO2: 24 mmol/L (ref 22–32)
CREATININE: 0.83 mg/dL (ref 0.61–1.24)
Chloride: 104 mmol/L (ref 101–111)
GFR calc Af Amer: >60 mL/min (ref >60)
GFR calc non Af Amer: >60 mL/min (ref >60)
GLUCOSE: 124 mg/dL — AB (ref 65–99)
Potassium: 3.8 mmol/L (ref 3.5–5.1)
Sodium: 136 mmol/L (ref 135–145)
TOTAL PROTEIN: 7 g/dL (ref 6.5–8.1)

## 2017-06-22 LAB — CBC WITH DIFFERENTIAL/PLATELET
BASOS ABS: 0 10*3/uL (ref 0–0.1)
BASOS PCT: 0 %
EOS ABS: 0 10*3/uL (ref 0–0.7)
EOS PCT: 1 %
HCT: 42.5 % (ref 40.0–52.0)
Hemoglobin: 14.6 g/dL (ref 13.0–18.0)
LYMPHS PCT: 27 %
Lymphs Abs: 1.1 10*3/uL (ref 1.0–3.6)
MCH: 34.3 pg — ABNORMAL HIGH (ref 26.0–34.0)
MCHC: 34.4 g/dL (ref 32.0–36.0)
MCV: 99.7 fL (ref 80.0–100.0)
MONO ABS: 0.3 10*3/uL (ref 0.2–1.0)
Monocytes Relative: 7 %
Neutro Abs: 2.7 10*3/uL (ref 1.4–6.5)
Neutrophils Relative %: 65 %
PLATELETS: 116 10*3/uL — AB (ref 150–440)
RBC: 4.26 MIL/uL — ABNORMAL LOW (ref 4.40–5.90)
RDW: 12.4 % (ref 11.5–14.5)
WBC: 4.2 10*3/uL (ref 3.8–10.6)

## 2017-06-22 LAB — LACTATE DEHYDROGENASE: LDH: 110 U/L (ref 98–192)

## 2017-06-22 LAB — TECHNOLOGIST SMEAR REVIEW: Tech Review: DECREASED

## 2017-06-22 NOTE — Progress Notes (Signed)
Pt in as new patient visit for thrombocytopenia

## 2017-06-22 NOTE — Progress Notes (Addendum)
Hematology/Oncology Consult note [facility] Telephone:([phone] [phone]   Patient Care Team: Adin Hector, MD as PCP - General (Internal Medicine)  REFERRING PROVIDER: Adin Hector, MD CHIEF COMPLAINTS/PURPOSE OF CONSULTATION:  Evaluation of thrombocytopenia  HISTORY OF PRESENTING ILLNESS:  David [name] is a  62 y.o.  male with PMH listed below who was referred to me for evaluation of thrombocytopenia. Patient recently had lab work done which revealed thrombocytopenia, platelet counts 101,000.  Reviewed patient's previous labs, his platelet count was 116,000 in Feb 2018, 109,000 in November 2017, Patient denies fatigue, weight loss, easy bruising, hematochezia, hemoptysis He denies fatigue, weight loss, easy bruising, hematochezia, hemoptysis. Use to have daytime sleepiness, resolved after started on CPAP. Alcohol Use: daily, 8 cans of beer.   Review of Systems  Constitutional: Negative for fever, malaise/fatigue and weight loss.  HENT: Negative for ear discharge and hearing loss.   Eyes: Negative for pain.  Respiratory: Negative for cough, hemoptysis and sputum production.   Cardiovascular: Negative for chest pain, palpitations and orthopnea.  Gastrointestinal: Negative for constipation, nausea and vomiting.  Genitourinary: Negative for dysuria and urgency.  Musculoskeletal: Negative for myalgias.  Skin: Negative for rash.  Neurological: Negative for dizziness.  Endo/Heme/Allergies: Does not bruise/bleed easily.  Psychiatric/Behavioral: Negative for depression and suicidal ideas.    MEDICAL HISTORY:  Past Medical History:  Diagnosis Date  . B12 deficiency   . Complete tear of right rotator cuff   . Compression fracture of spine ([place])   . COPD (chronic obstructive pulmonary disease) ([place])   . DDD (degenerative disc disease), lumbar   . Hyperlipidemia   . Hypothyroidism   . Lung nodule, multiple    3MM. AND 8 MM  .  Nocturnal hypoxia   . Pneumonia   . Sleep apnea   . Thrombocytopenia ([place])   . Thyroid disease     SURGICAL HISTORY: Past Surgical History:  Procedure Laterality Date  . BACK SURGERY  1991   L5-S1  . COLONOSCOPY    . ELBOW ARTHROSCOPY Right   . KYPHOPLASTY N/A 06/10/2016   Procedure: KYPHOPLASTY T12;  Surgeon: Hessie Knows, MD;  Location: [facility] ORS;  Service: Orthopedics;  Laterality: N/A;  . KYPHOPLASTY     T12  . SHOULDER ARTHROSCOPY    . TENODESIS LONG TENDON BICEPS Right   . TONSILLECTOMY      SOCIAL HISTORY: Social History   Socioeconomic History  . Marital status: Married    Spouse name: Not on file  . Number of children: Not on file  . Years of education: Not on file  . Highest education level: Not on file  Social Needs  . Financial resource strain: Not on file  . Food insecurity - worry: Not on file  . Food insecurity - inability: Not on file  . Transportation needs - medical: Not on file  . Transportation needs - non-medical: Not on file  Occupational History  . Not on file  Tobacco Use  . Smoking status: Former Smoker    Last attempt to quit: 06/09/1984    Years since quitting: 33.0  . Smokeless tobacco: Never Used  Substance and Sexual Activity  . Alcohol use: Yes    Alcohol/week: 4.2 oz    Types: 7 Cans of beer per week    Comment: none last 24 hrs  . Drug use: No  . Sexual activity: Not on file  Other Topics Concern  . Not on file  Social History Narrative  .  Not on file    FAMILY HISTORY: Family History  Problem Relation Age of Onset  . Heart disease Father     ALLERGIES:  is allergic to penicillins.  MEDICATIONS:  Current Outpatient Medications  Medication Sig Dispense Refill  . albuterol (PROVENTIL HFA;VENTOLIN HFA) 108 (90 Base) MCG/ACT inhaler Inhale 2 puffs into the lungs every 4 (four) hours as needed for wheezing or shortness of breath. 1 Inhaler 2  . levothyroxine (SYNTHROID, LEVOTHROID) 112 MCG tablet Take 112 mcg by mouth daily  before breakfast.    . montelukast (SINGULAIR) 10 MG tablet Take 10 mg by mouth at bedtime.    Marland Kitchen SPIRIVA RESPIMAT 2.5 MCG/ACT AERS inhale 2 puffs INTO LUNGS daily 12 g 5  . SYMBICORT 160-4.5 MCG/ACT inhaler inhale 2 puffs by mouth twice a day 30.6 g 2  . TURMERIC PO Take by mouth.     No current facility-administered medications for this visit.      PHYSICAL EXAMINATION: ECOG PERFORMANCE STATUS: 0 - Asymptomatic Vitals:   06/22/17 0936  BP: (!) 150/89  Pulse: 73  Resp: 18  Temp: 97.7 F (36.5 C)   Filed Weights   06/22/17 0936  Weight: 200 lb 7 oz (90.9 kg)    Physical Exam  Constitutional: He is oriented to person, place, and time and well-developed, well-nourished, and in no distress. No distress.  HENT:  Head: Normocephalic and atraumatic.  Mouth/Throat: Oropharynx is clear and moist. No oropharyngeal exudate.  Eyes: EOM are normal. Pupils are equal, round, and reactive to light. No scleral icterus.  Neck: Normal range of motion. Neck supple.  Cardiovascular: Normal rate.  No murmur heard. Pulmonary/Chest: Effort normal and breath sounds normal. No respiratory distress. He exhibits no tenderness.  Abdominal: There is no tenderness. There is no rebound.  Musculoskeletal: Normal range of motion. He exhibits no edema.  Lymphadenopathy:    He has no cervical adenopathy.  Neurological: He is alert and oriented to person, place, and time. No cranial nerve deficit.  Skin: Skin is warm and dry.  Psychiatric: Affect and judgment normal.     LABORATORY DATA:  I have reviewed the data as listed Lab Results  Component Value Date   WBC 4.2 06/22/2017   HGB 14.6 06/22/2017   HCT 42.5 06/22/2017   MCV 99.7 06/22/2017   PLT 116 (L) 06/22/2017   Recent Labs    06/22/17 1039  NA 136  K 3.8  CL 104  CO2 24  GLUCOSE 124*  BUN 18  CREATININE 0.83  CALCIUM 9.4  GFRNONAA >60  GFRAA >60  PROT 7.0  ALBUMIN 4.0  AST 20  ALT 26  ALKPHOS 41  BILITOT 0.8        ASSESSMENT & PLAN:  1. Thrombocytopenia ([place])   2. Significant use of alcohol    For the work up of patient's thrombocytopenia, I recommend checking CBC;CMP, LDH; pathology smear review, folate, Vitamin B12, hepatitis, HIV,   and flowcytometry. Will also check ultrasound of the abdomen. I think thrombocytopenia is likely secondary to alcohol use. Alcohol cessation discussed with patient.  Also, discussed with the patient that if no clear etiology found- bone marrow biopsy would be suggested. Currently await for the above workup.   # Patient follow-up with me in approximately 2 weeks to review the above results.  All questions were answered. The patient knows to call the clinic with any problems questions or concerns.  Return of visit: 2 weeks. Thank you for this kind referral and the  opportunity to participate in the care of this patient. A copy of today's note is routed to referring provider    Earlie Server, MD, PhD Hematology Oncology [facility] Pager- [phone] 06/22/2017

## 2017-06-23 LAB — HIV ANTIBODY (ROUTINE TESTING W REFLEX): HIV SCREEN 4TH GENERATION: NONREACTIVE

## 2017-06-23 LAB — HEPATITIS PANEL, ACUTE
HCV Ab: <0.1 s/co ratio (ref 0.0–0.9)
HEP A IGM: NEGATIVE
Hep B C IgM: NEGATIVE
Hepatitis B Surface Ag: NEGATIVE

## 2017-06-24 LAB — COMP PANEL: LEUKEMIA/LYMPHOMA

## 2017-06-29 ENCOUNTER — Ambulatory Visit
Admission: RE | Admit: 2017-06-29 | Discharge: 2017-06-29 | Disposition: A | Discharge status: Home / Self Care | Payer: BLUE CROSS/BLUE SHIELD | Source: Ambulatory Visit | Attending: Oncology | Admitting: Oncology

## 2017-06-29 DIAGNOSIS — D696 Thrombocytopenia, unspecified: Secondary | ICD-10-CM | POA: Diagnosis not present

## 2017-06-29 DIAGNOSIS — N281 Cyst of kidney, acquired: Secondary | ICD-10-CM | POA: Diagnosis not present

## 2017-06-29 DIAGNOSIS — R932 Abnormal findings on diagnostic imaging of liver and biliary tract: Secondary | ICD-10-CM | POA: Diagnosis not present

## 2017-07-06 ENCOUNTER — Inpatient Hospital Stay: Payer: BLUE CROSS/BLUE SHIELD | Attending: Oncology | Admitting: Oncology

## 2017-07-06 ENCOUNTER — Encounter: Payer: Self-pay | Admitting: Oncology

## 2017-07-06 VITALS — BP 137/81 | HR 72 | Temp 97.5°F | Resp 18 | Wt 201.3 lb

## 2017-07-06 DIAGNOSIS — M5136 Other intervertebral disc degeneration, lumbar region: Secondary | ICD-10-CM | POA: Diagnosis not present

## 2017-07-06 DIAGNOSIS — D696 Thrombocytopenia, unspecified: Secondary | ICD-10-CM | POA: Diagnosis not present

## 2017-07-06 DIAGNOSIS — F1010 Alcohol abuse, uncomplicated: Secondary | ICD-10-CM | POA: Insufficient documentation

## 2017-07-06 DIAGNOSIS — Z87891 Personal history of nicotine dependence: Secondary | ICD-10-CM | POA: Insufficient documentation

## 2017-07-06 DIAGNOSIS — E785 Hyperlipidemia, unspecified: Secondary | ICD-10-CM | POA: Diagnosis not present

## 2017-07-06 DIAGNOSIS — E039 Hypothyroidism, unspecified: Secondary | ICD-10-CM | POA: Diagnosis not present

## 2017-07-06 DIAGNOSIS — J449 Chronic obstructive pulmonary disease, unspecified: Secondary | ICD-10-CM | POA: Diagnosis not present

## 2017-07-06 DIAGNOSIS — Z79899 Other long term (current) drug therapy: Secondary | ICD-10-CM | POA: Insufficient documentation

## 2017-07-06 DIAGNOSIS — Z789 Other specified health status: Secondary | ICD-10-CM

## 2017-07-06 DIAGNOSIS — K700 Alcoholic fatty liver: Secondary | ICD-10-CM | POA: Insufficient documentation

## 2017-07-06 DIAGNOSIS — E538 Deficiency of other specified B group vitamins: Secondary | ICD-10-CM | POA: Diagnosis not present

## 2017-07-06 NOTE — Progress Notes (Signed)
Pt in for follow up and test results.  

## 2017-07-06 NOTE — Progress Notes (Signed)
Hematology/Oncology Follow up note [facility] [phone] [phone]   Patient Care Team: Adin Hector, MD as PCP - General (Internal Medicine)  REFERRING PROVIDER: Adin Hector, MD CHIEF COMPLAINTS/PURPOSE OF CONSULTATION:  Evaluation of thrombocytopenia  HISTORY OF PRESENTING ILLNESS:  David [name] is a  62 y.o.  male with PMH listed below who was referred to me for evaluation of thrombocytopenia. Patient recently had lab work done which revealed thrombocytopenia, platelet counts 101,000.  Reviewed patient's previous labs, his platelet count was 116,000 in Feb 2018, 109,000 in November 2017, Patient denies fatigue, weight loss, easy bruising, hematochezia, hemoptysis He denies fatigue, weight loss, easy bruising, hematochezia, hemoptysis. Use to have daytime sleepiness, resolved after started on CPAP. Alcohol Use: daily, 8 cans of beer.   INTERVAL HISTORY David [name] is a 62 y.o. male who has above history reviewed by me today presents for follow up visit for management of thrombocytopenia.  Denies any acute bleeding events.  He admits to drinking'a bit more"alcohol than usual his wife recently has been going through multiple   Review of Systems  Constitutional: Negative for fever, malaise/fatigue and weight loss.  HENT: Negative for ear discharge, hearing loss and sore throat.   Eyes: Negative for photophobia.  Respiratory: Negative for cough.   Cardiovascular: Negative for chest pain, palpitations and orthopnea.  Gastrointestinal: Negative for blood in stool, constipation, nausea and vomiting.  Genitourinary: Negative for dysuria, frequency and urgency.  Musculoskeletal: Negative for myalgias and neck pain.  Skin: Negative for rash.  Neurological: Negative for dizziness, tingling, tremors and sensory change.  Endo/Heme/Allergies: Does not bruise/bleed easily.  Psychiatric/Behavioral: Negative for depression. The  patient is not nervous/anxious.     MEDICAL HISTORY:  Past Medical History:  Diagnosis Date  . B12 deficiency   . Complete tear of right rotator cuff   . Compression fracture of spine ([place])   . COPD (chronic obstructive pulmonary disease) ([place])   . DDD (degenerative disc disease), lumbar   . Hyperlipidemia   . Hypothyroidism   . Lung nodule, multiple    3MM. AND 8 MM  . Nocturnal hypoxia   . Pneumonia   . Sleep apnea   . Thrombocytopenia ([place])   . Thyroid disease     SURGICAL HISTORY: Past Surgical History:  Procedure Laterality Date  . BACK SURGERY  1991   L5-S1  . COLONOSCOPY    . ELBOW ARTHROSCOPY Right   . KYPHOPLASTY N/A 06/10/2016   Procedure: KYPHOPLASTY T12;  Surgeon: Hessie Knows, MD;  Location: [facility] ORS;  Service: Orthopedics;  Laterality: N/A;  . KYPHOPLASTY     T12  . SHOULDER ARTHROSCOPY    . TENODESIS LONG TENDON BICEPS Right   . TONSILLECTOMY      SOCIAL HISTORY: Social History   Socioeconomic History  . Marital status: Married    Spouse name: Not on file  . Number of children: Not on file  . Years of education: Not on file  . Highest education level: Not on file  Occupational History  . Not on file  Social Needs  . Financial resource strain: Not on file  . Food insecurity:    Worry: Not on file    Inability: Not on file  . Transportation needs:    Medical: Not on file    Non-medical: Not on file  Tobacco Use  . Smoking status: Former Smoker    Last attempt to quit: 06/09/1984    Years since quitting:  33.0  . Smokeless tobacco: Never Used  Substance and Sexual Activity  . Alcohol use: Yes    Alcohol/week: 4.2 oz    Types: 7 Cans of beer per week    Comment: none last 24 hrs  . Drug use: No  . Sexual activity: Not on file  Lifestyle  . Physical activity:    Days per week: Not on file    Minutes per session: Not on file  . Stress: Not on file  Relationships  . Social connections:    Talks on phone: Not on file    Gets together: Not  on file    Attends religious service: Not on file    Active member of club or organization: Not on file    Attends meetings of clubs or organizations: Not on file    Relationship status: Not on file  . Intimate partner violence:    Fear of current or ex partner: Not on file    Emotionally abused: Not on file    Physically abused: Not on file    Forced sexual activity: Not on file  Other Topics Concern  . Not on file  Social History Narrative  . Not on file    FAMILY HISTORY: Family History  Problem Relation Age of Onset  . Heart disease Father     ALLERGIES:  is allergic to penicillins.  MEDICATIONS:  Current Outpatient Medications  Medication Sig Dispense Refill  . albuterol (PROVENTIL HFA;VENTOLIN HFA) 108 (90 Base) MCG/ACT inhaler Inhale 2 puffs into the lungs every 4 (four) hours as needed for wheezing or shortness of breath. 1 Inhaler 2  . levothyroxine (SYNTHROID, LEVOTHROID) 112 MCG tablet Take 112 mcg by mouth daily before breakfast.    . montelukast (SINGULAIR) 10 MG tablet Take 10 mg by mouth at bedtime.    Marland Kitchen SPIRIVA RESPIMAT 2.5 MCG/ACT AERS inhale 2 puffs INTO LUNGS daily 12 g 5  . SYMBICORT 160-4.5 MCG/ACT inhaler inhale 2 puffs by mouth twice a day 30.6 g 2  . TURMERIC PO Take by mouth.     No current facility-administered medications for this visit.      PHYSICAL EXAMINATION: ECOG PERFORMANCE STATUS: 0 - Asymptomatic Vitals:   07/06/17 1008  BP: 137/81  Pulse: 72  Resp: 18  Temp: (!) 97.5 F (36.4 C)   Filed Weights   07/06/17 1008  Weight: 201 lb 5 oz (91.3 kg)    Physical Exam  Constitutional: He is oriented to person, place, and time and well-developed, well-nourished, and in no distress. No distress.  HENT:  Head: Normocephalic and atraumatic.  Mouth/Throat: Oropharynx is clear and moist. No oropharyngeal exudate.  Eyes: Pupils are equal, round, and reactive to light. EOM are normal. No scleral icterus.  Neck: Normal range of motion. Neck  supple.  Cardiovascular: Normal rate.  No murmur heard. Pulmonary/Chest: Effort normal and breath sounds normal. No respiratory distress. He exhibits no tenderness.  Abdominal: He exhibits no mass. There is no tenderness. There is no rebound.  Musculoskeletal: Normal range of motion. He exhibits no edema.  Lymphadenopathy:    He has no cervical adenopathy.  Neurological: He is alert and oriented to person, place, and time. No cranial nerve deficit.  Skin: Skin is warm and dry. No erythema.  Psychiatric: Affect and judgment normal.     LABORATORY DATA:  I have reviewed the data as listed Lab Results  Component Value Date   WBC 4.2 06/22/2017   HGB 14.6 06/22/2017   HCT  42.5 06/22/2017   MCV 99.7 06/22/2017   PLT 116 (L) 06/22/2017   Recent Labs    06/22/17 1039  NA 136  K 3.8  CL 104  CO2 24  GLUCOSE 124*  BUN 18  CREATININE 0.83  CALCIUM 9.4  GFRNONAA >60  GFRAA >60  PROT 7.0  ALBUMIN 4.0  AST 20  ALT 26  ALKPHOS 41  BILITOT 0.8    Labs from [facility] 06/08/2017 Vitamin B12  422  Normal LDH; Vitamin B12, negative hepatitis, negative HIV,   and negative flowcytometry.  RADIOGRAPHIC STUDIES: I have personally reviewed the radiological images as listed and agreed with the findings in the report.  US abdomen 06/29/2017  No gallstones or sonographic evidence of acute cholecystitis. Increased hepatic echotexture most compatible with fatty infiltrative change. Nonvisualization of the pancreas due to bowel gas. The spleen is normal in size and echotexture.  Multiple cortical and parapelvic cysts within the kidneys. No hydronephrosis.  ASSESSMENT & PLAN:  1. Thrombocytopenia ([place])   2. Significant use of alcohol   3. Fatty liver, alcoholic     Lab results were discussed with patient.  He has normal LDH, normal B12 level, negative for hepatitis and HIV, flow cytometry of peripheral blood.  Check folate. Ultrasound of abdomen showed echotexture  indicating underlying fatty liver disease or alcohol liver disease.   Discussed with patient that his thrombocytopenia is likely secondary to alcohol use.  Alcohol cessation was again discussed with patient in details.   I will refer him to gastroenterologist to [facility] for additional evaluation # Patient follow-up with me in approximately 2 weeks to review the above results.  All questions were answered. The patient knows to call the clinic with any problems questions or concerns.  Return of visit: 3 months  Earlie Server, MD, PhD Hematology Oncology [facility] Pager- [phone] 07/06/2017

## 2017-07-07 ENCOUNTER — Ambulatory Visit: Payer: BLUE CROSS/BLUE SHIELD | Admitting: Internal Medicine

## 2017-07-07 ENCOUNTER — Ambulatory Visit: Payer: BLUE CROSS/BLUE SHIELD | Admitting: Gastroenterology

## 2017-07-08 ENCOUNTER — Encounter: Payer: Self-pay | Admitting: Internal Medicine

## 2017-07-08 ENCOUNTER — Ambulatory Visit: Payer: BLUE CROSS/BLUE SHIELD | Admitting: Internal Medicine

## 2017-07-08 VITALS — BP 120/80 | HR 65 | Ht 70.0 in | Wt 193.0 lb

## 2017-07-08 DIAGNOSIS — J449 Chronic obstructive pulmonary disease, unspecified: Secondary | ICD-10-CM | POA: Diagnosis not present

## 2017-07-08 DIAGNOSIS — G4733 Obstructive sleep apnea (adult) (pediatric): Secondary | ICD-10-CM

## 2017-07-08 NOTE — Patient Instructions (Addendum)
Continue Inhalers as prescribed Continue CPAP as prescribed Will order Dream Mask  Check ONO on CPAP

## 2017-07-08 NOTE — Progress Notes (Signed)
David [name] Consultation      Date: 07/08/2017,   MRN# [id number] David [name] [birth year]  David [name] is a 62 y.o. 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  now on symbicort and spiriva-doing ok on this regimen  HOWEVER< PATIENT [place]   Patient remains  fatigue and DOE, excessive tiredness during the day Sleep study confirms OSA-feels wonderful with CPAP therapy, has refreshed sleep Less tired and fatigue  No signs of infection at this time  Current Compliance Report 100% compliance AHI 0.1 Auto CPAP 7-18cm h20   COPD seems to be at baseline and under control with inhalers CT chest 3.15.19 Reviewed with patient +emphysema No suspicious nodules, massess No effusions No pneumonia     No signs of infection at this time Current Medication:  Current Outpatient Medications:  .  albuterol (PROVENTIL HFA;VENTOLIN HFA) 108 (90 Base) MCG/ACT inhaler, Inhale 2 puffs into the lungs every 4 (four) hours as needed for wheezing or shortness of breath., Disp: 1 Inhaler, Rfl: 2 .  levothyroxine (SYNTHROID, LEVOTHROID) 112 MCG tablet, Take 112 mcg by mouth daily before breakfast., Disp: , Rfl:  .  montelukast (SINGULAIR) 10 MG tablet, Take 10 mg by mouth at bedtime., Disp: , Rfl:  .  SPIRIVA RESPIMAT 2.5 MCG/ACT AERS, inhale 2 puffs INTO LUNGS daily, Disp: 12 g, Rfl: 5 .  SYMBICORT 160-4.5 MCG/ACT inhaler, inhale 2 puffs by mouth twice a day, Disp: 30.6 g, Rfl: 2 .  TURMERIC PO, Take by  mouth., Disp: , Rfl:     ALLERGIES   Penicillins     REVIEW OF SYSTEMS   Review of Systems  Constitutional: Negative for chills, diaphoresis, fever, malaise/fatigue and weight loss.  HENT: Negative for congestion and hearing loss.   Respiratory: Positive for shortness of breath. Negative for cough, hemoptysis, sputum production and wheezing.   Cardiovascular: Negative for chest pain, palpitations, orthopnea and leg swelling.  Gastrointestinal: Negative for heartburn and nausea.  Skin: Negative for rash.  Neurological: Negative for weakness and headaches.  Psychiatric/Behavioral: Negative for depression.  All other systems reviewed and are negative.     PHYSICAL EXAM  Physical Exam  Constitutional: He is oriented to person, place, and time. He appears well-developed and well-nourished. No distress.  Cardiovascular: Normal rate, regular rhythm and normal heart sounds.  No murmur heard. Pulmonary/Chest: No respiratory distress. He has no wheezes. He has no rales.  Musculoskeletal: Normal range of motion. He exhibits no edema.  Neurological: He is alert and oriented to person, place, and time. No cranial nerve deficit.  Skin: Skin is warm. He is not diaphoretic.  Psychiatric: He has a normal mood and affect.    CT chest 3.15.19 Emphysematous changes, pulmonary scarring and patchy areas of peribronchial thickening and minimal bronchiectasis. No suspicious nodules, masses   ASSESSMENT/PLAN   62 yo white male with Very severe COPD with Gold Stage D, patient resp status at baseline  is poor,  With OSA. His OSA is well treated and doing well overall.  However, patient feels much better with [place] but [facility] wont pay for this  COPD 1.continue symbicort (LABA/IC) and Spiriva, albuterol as needed Patient feels that his exertional dyspnea stable we discussed that he is already on maximal therapy. THEO therapy is NOT ideal medication in setting of ETOH use and  Thyroid problems Patient drinks 2-4 beers per day and I thought that this would interfere with THEOPHYLINE therapy Will need to check for nocturnal hypoxia from his COPD, will place ONO order to check on CPAP  OSA 2.AUTO CPAP therapy CPAP 7-18 cm h20 -patient doing well with compliance  3.overweight -recommend significant weight loss-patient now weighs 193 down from 218 -recommend changing diet  4.Deconditioned state -Recommend increased daily activity and exercise      Patient satisfied with Plan of action and management. All questions answered Follow up in 6 months  Perla Echavarria Patricia Pesa, M.D.  [name]  Medical Director [place] Director [facility] Cardio-Pulmonary Department

## 2017-07-11 ENCOUNTER — Ambulatory Visit: Payer: BLUE CROSS/BLUE SHIELD | Admitting: Gastroenterology

## 2017-07-11 ENCOUNTER — Other Ambulatory Visit: Payer: Self-pay

## 2017-07-11 ENCOUNTER — Encounter: Payer: Self-pay | Admitting: Gastroenterology

## 2017-07-11 VITALS — BP 135/89 | HR 73 | Temp 97.9°F | Resp 16 | Ht 70.0 in | Wt 190.4 lb

## 2017-07-11 DIAGNOSIS — D696 Thrombocytopenia, unspecified: Secondary | ICD-10-CM | POA: Diagnosis not present

## 2017-07-11 NOTE — Progress Notes (Signed)
David Darby, MD [address]  [place]  [place], [place] [address]  Main: [phone]  Fax: [phone]    Gastroenterology Consultation  Referring Provider:     Adin Hector, MD Primary Care Physician:  Adin Hector, MD Primary Gastroenterologist:  Dr. Cephas [name] Reason for Consultation:     Chronic thrombocytopenia        HPI:   Detron Carras III is a 62 y.o. Caucasian male referred by Dr. Caryl Comes, Tonette Bihari, MD  for consultation & management of chronic thrombocytopenia. He found to have low platelet since 2017. He also has B12 deficiency with macrocytosis which has currently resolved on B12 supplementation. Patient is referred here to evaluate for chronic liver disease that may have led to thrombocytopenia. He had an ultrasound of the liver last week which revealed mildly increased echotexture, no evidence of portal hypertension or portal vein thrombosis or splenomegaly. He never had abnormal LFTs. He does not have any signs or symptoms of chronic liver disease. His viral hepatitis panel was negative. He does consume alcohol, approximately 1 beer a day. He intentionally lost about 28 pounds in last 2-3 months with portion diet, exercise, plays golf and taking care of her wife who recently underwent below knee amputation. He otherwise denies any GI complaints, other than variable stool consistency,varies depending on his diet, nonbloody.   NSAIDs: none  Antiplts/Anticoagulants/Anti thrombotics: none  GI Procedures: colonoscopy by Dr. Vira Agar for colon cancer screening 03/07/2017 - One diminutive polyp in the ascending colon, removed with a jumbo cold forceps. Resected and retrieved. - One diminutive polyp at the recto-sigmoid colon, removed with a jumbo cold forceps. Resected and retrieved. - Internal hemorrhoids. - The examination was otherwise normal. DIAGNOSIS:  A. COLON POLYP, ASCENDING; COLD BIOPSY:  - TUBULAR ADENOMA.  - NEGATIVE FOR HIGH-GRADE  DYSPLASIA AND MALIGNANCY.   B. COLON POLYP, RECTOSIGMOID; COLD BIOPSY:  - HYPERPLASTIC POLYP.  - NEGATIVE FOR DYSPLASIA AND MALIGNANCY.   Patient and his family history of GI malignancy   Past Medical History:  Diagnosis Date  . B12 deficiency   . Complete tear of right rotator cuff   . Compression fracture of spine ([place])   . COPD (chronic obstructive pulmonary disease) ([place])   . DDD (degenerative disc disease), lumbar   . Hyperlipidemia   . Hypothyroidism   . Lung nodule, multiple    3MM. AND 8 MM  . Nocturnal hypoxia   . Pneumonia   . Sleep apnea   . Thrombocytopenia ([place])   . Thyroid disease     Past Surgical History:  Procedure Laterality Date  . BACK SURGERY  1991   L5-S1  . COLONOSCOPY    . ELBOW ARTHROSCOPY Right   . KYPHOPLASTY N/A 06/10/2016   Procedure: KYPHOPLASTY T12;  Surgeon: Hessie Knows, MD;  Location: [facility] ORS;  Service: Orthopedics;  Laterality: N/A;  . KYPHOPLASTY     T12  . SHOULDER ARTHROSCOPY    . TENODESIS LONG TENDON BICEPS Right   . TONSILLECTOMY       Current Outpatient Medications:  .  albuterol (PROVENTIL HFA;VENTOLIN HFA) 108 (90 Base) MCG/ACT inhaler, Inhale 2 puffs into the lungs every 4 (four) hours as needed for wheezing or shortness of breath., Disp: 1 Inhaler, Rfl: 2 .  ferrous sulfate 325 (65 FE) MG tablet, Take 325 mg by mouth daily with breakfast., Disp: , Rfl:  .  levothyroxine (SYNTHROID, LEVOTHROID) 112 MCG tablet, Take 112 mcg by mouth  daily before breakfast., Disp: , Rfl:  .  montelukast (SINGULAIR) 10 MG tablet, Take 10 mg by mouth at bedtime., Disp: , Rfl:  .  rosuvastatin (CRESTOR) 10 MG tablet, Take 10 mg by mouth daily., Disp: , Rfl:  .  SPIRIVA RESPIMAT 2.5 MCG/ACT AERS, inhale 2 puffs INTO LUNGS daily, Disp: 12 g, Rfl: 5 .  SYMBICORT 160-4.5 MCG/ACT inhaler, inhale 2 puffs by mouth twice a day, Disp: 30.6 g, Rfl: 2   Family History  Problem Relation Age of Onset  . Heart disease Father      Social History    Tobacco Use  . Smoking status: Former Smoker    Last attempt to quit: 06/09/1984    Years since quitting: 33.1  . Smokeless tobacco: Never Used  Substance Use Topics  . Alcohol use: Yes    Alcohol/week: 4.2 oz    Types: 7 Cans of beer per week  . Drug use: No    Allergies as of 07/11/2017 - Review Complete 07/11/2017  Allergen Reaction Noted  . Penicillins  10/10/2015    Review of Systems:    All systems reviewed and negative except where noted in HPI.   Physical Exam:  BP 135/89   Pulse 73   Temp 97.9 F (36.6 C) (Oral)   Resp 16   Ht 5\' 10"  (1.778 m)   Wt 190 lb 6.4 oz (86.4 kg)   BMI 27.32 kg/m  No LMP for male patient.  General:   Alert,  Well-developed, well-nourished, pleasant and cooperative in NAD Head:  Normocephalic and atraumatic. Eyes:  Sclera clear, no icterus.   Conjunctiva pink. Ears:  Normal auditory acuity. Nose:  No deformity, discharge, or lesions. Mouth:  No deformity or lesions,oropharynx pink & moist. Neck:  Supple; no masses or thyromegaly. Lungs:  Respirations even and unlabored.  Clear throughout to auscultation.   No wheezes, crackles, or rhonchi. No acute distress. Heart:  Regular rate and rhythm; no murmurs, clicks, rubs, or gallops. Abdomen:  Normal bowel sounds. Soft, non-tender and non-distended without masses, hepatosplenomegaly or hernias noted.  No guarding or rebound tenderness.   Rectal: Not performed Msk:  Symmetrical without gross deformities. Good, equal movement & strength bilaterally. Pulses:  Normal pulses noted. Extremities:  No clubbing or edema.  No cyanosis. Neurologic:  Alert and oriented x3;  grossly normal neurologically. Skin:  Intact without significant lesions or rashes. No jaundice. Lymph Nodes:  No significant cervical adenopathy. Psych:  Alert and cooperative. Normal mood and affect.  Imaging Studies: reviewed  Assessment and Plan:   Richy Spradley III is a 62 y.o. Caucasian male with hypothyroidism, chronic  thrombocytopenia is seen in consultation for evaluation of chronic liver disease. Patient does not have signs or symptoms, no biochemical or imaging evidence of chronic liver disease/advanced fibrosis/cirrhosis. 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  - Recommend ultrasound [name] Elastography secondary to possible underlying fatty liver - Refer to [facility] hematology for evaluation of chronic thrombocytopenia - encouraged him to cut back on alcohol   Follow up as needed   David Darby, MD

## 2017-08-08 ENCOUNTER — Ambulatory Visit: Payer: BLUE CROSS/BLUE SHIELD | Admitting: Gastroenterology

## 2017-08-24 ENCOUNTER — Encounter: Payer: Self-pay | Admitting: Internal Medicine

## 2017-08-24 DIAGNOSIS — J449 Chronic obstructive pulmonary disease, unspecified: Secondary | ICD-10-CM

## 2017-09-02 ENCOUNTER — Ambulatory Visit
Admission: RE | Admit: 2017-09-02 | Discharge: 2017-09-02 | Disposition: A | Discharge status: Home / Self Care | Payer: BLUE CROSS/BLUE SHIELD | Source: Ambulatory Visit | Attending: Gastroenterology | Admitting: Gastroenterology

## 2017-09-02 ENCOUNTER — Other Ambulatory Visit: Payer: Self-pay | Admitting: Gastroenterology

## 2017-09-02 DIAGNOSIS — D696 Thrombocytopenia, unspecified: Secondary | ICD-10-CM

## 2017-09-05 ENCOUNTER — Other Ambulatory Visit: Payer: Self-pay

## 2017-09-05 ENCOUNTER — Telehealth: Payer: Self-pay

## 2017-09-05 DIAGNOSIS — K7460 Unspecified cirrhosis of liver: Secondary | ICD-10-CM

## 2017-09-05 NOTE — Telephone Encounter (Signed)
Dr. Marius Ditch,  Patient contacted office to schedule his EGD.  He states he received a message from you to schedule it.  I don't see any notes on this.  Please advise.  I will call him to schedule.  Thanks [employer]

## 2017-09-05 NOTE — Telephone Encounter (Signed)
Thanks he has been scheduled for 09/14/17 at [facility]

## 2017-09-13 ENCOUNTER — Other Ambulatory Visit: Payer: Self-pay

## 2017-09-13 ENCOUNTER — Telehealth: Payer: Self-pay

## 2017-09-13 ENCOUNTER — Encounter: Payer: Self-pay | Admitting: Anesthesiology

## 2017-09-13 DIAGNOSIS — K7460 Unspecified cirrhosis of liver: Secondary | ICD-10-CM

## 2017-09-13 NOTE — Telephone Encounter (Signed)
[place]  Contacted office to let me know that patient is not suitable for [facility] for EGD due to medical history of severe sleep apnea.  I've informed patient, and he is not pleased that he has to change his location.  I informed him that we can change his date to 09/26/17.  This was your next available spot that I saw available.  Patient would like a call back today around 3 to discuss.  Thanks [employer]

## 2017-09-13 NOTE — Telephone Encounter (Signed)
Returned pt's call and advised him why his procedure changed location. Pt was upset that he was not notified until this morning and his procedure is tomorrow. I advised him I would speak with the surgical center's clinical manager about his complaint. Pt has been added to [facility] Endo's schedule. He requested to be first on the schedule for 09/26/17. I advised pt I will make sure he was.

## 2017-09-14 ENCOUNTER — Telehealth: Payer: Self-pay | Admitting: *Deleted

## 2017-09-14 ENCOUNTER — Ambulatory Visit
Admission: RE | Admit: 2017-09-14 | Payer: BLUE CROSS/BLUE SHIELD | Source: Ambulatory Visit | Admitting: Gastroenterology

## 2017-09-14 HISTORY — DX: Emphysema, unspecified: J43.9

## 2017-09-14 HISTORY — DX: Personal history of other diseases of the respiratory system: Z87.09

## 2017-09-14 HISTORY — DX: Shortness of breath: R06.02

## 2017-09-14 SURGERY — ESOPHAGOGASTRODUODENOSCOPY (EGD) WITH PROPOFOL
Anesthesia: Choice

## 2017-09-14 NOTE — Telephone Encounter (Signed)
Patient aware of results of ONO. No 02 needed at this time qhs. Nothing further needed.

## 2017-09-26 ENCOUNTER — Encounter
Admission: RE | Disposition: A | Discharge status: Home / Self Care | Payer: Self-pay | Source: Ambulatory Visit | Attending: Gastroenterology

## 2017-09-26 ENCOUNTER — Ambulatory Visit: Payer: BLUE CROSS/BLUE SHIELD | Admitting: Anesthesiology

## 2017-09-26 ENCOUNTER — Ambulatory Visit
Admission: RE | Admit: 2017-09-26 | Discharge: 2017-09-26 | Disposition: A | Discharge status: Home / Self Care | Payer: BLUE CROSS/BLUE SHIELD | Source: Ambulatory Visit | Attending: Gastroenterology | Admitting: Gastroenterology

## 2017-09-26 DIAGNOSIS — K7400 Hepatic fibrosis, unspecified: Secondary | ICD-10-CM

## 2017-09-26 DIAGNOSIS — Z79899 Other long term (current) drug therapy: Secondary | ICD-10-CM | POA: Insufficient documentation

## 2017-09-26 DIAGNOSIS — E538 Deficiency of other specified B group vitamins: Secondary | ICD-10-CM | POA: Insufficient documentation

## 2017-09-26 DIAGNOSIS — R918 Other nonspecific abnormal finding of lung field: Secondary | ICD-10-CM | POA: Diagnosis not present

## 2017-09-26 DIAGNOSIS — E785 Hyperlipidemia, unspecified: Secondary | ICD-10-CM | POA: Diagnosis not present

## 2017-09-26 DIAGNOSIS — Z87891 Personal history of nicotine dependence: Secondary | ICD-10-CM | POA: Diagnosis not present

## 2017-09-26 DIAGNOSIS — Z9889 Other specified postprocedural states: Secondary | ICD-10-CM | POA: Insufficient documentation

## 2017-09-26 DIAGNOSIS — K228 Other specified diseases of esophagus: Secondary | ICD-10-CM

## 2017-09-26 DIAGNOSIS — J449 Chronic obstructive pulmonary disease, unspecified: Secondary | ICD-10-CM | POA: Diagnosis not present

## 2017-09-26 DIAGNOSIS — I8500 Esophageal varices without bleeding: Secondary | ICD-10-CM

## 2017-09-26 DIAGNOSIS — M1990 Unspecified osteoarthritis, unspecified site: Secondary | ICD-10-CM | POA: Insufficient documentation

## 2017-09-26 DIAGNOSIS — K210 Gastro-esophageal reflux disease with esophagitis: Secondary | ICD-10-CM | POA: Diagnosis not present

## 2017-09-26 DIAGNOSIS — M5136 Other intervertebral disc degeneration, lumbar region: Secondary | ICD-10-CM | POA: Diagnosis not present

## 2017-09-26 DIAGNOSIS — K7460 Unspecified cirrhosis of liver: Secondary | ICD-10-CM | POA: Diagnosis present

## 2017-09-26 DIAGNOSIS — G4730 Sleep apnea, unspecified: Secondary | ICD-10-CM | POA: Insufficient documentation

## 2017-09-26 DIAGNOSIS — E039 Hypothyroidism, unspecified: Secondary | ICD-10-CM | POA: Insufficient documentation

## 2017-09-26 DIAGNOSIS — Z7951 Long term (current) use of inhaled steroids: Secondary | ICD-10-CM | POA: Diagnosis not present

## 2017-09-26 DIAGNOSIS — K219 Gastro-esophageal reflux disease without esophagitis: Secondary | ICD-10-CM | POA: Diagnosis not present

## 2017-09-26 DIAGNOSIS — D696 Thrombocytopenia, unspecified: Secondary | ICD-10-CM | POA: Insufficient documentation

## 2017-09-26 HISTORY — PX: ESOPHAGOGASTRODUODENOSCOPY (EGD) WITH PROPOFOL: SHX5813

## 2017-09-26 SURGERY — ESOPHAGOGASTRODUODENOSCOPY (EGD) WITH PROPOFOL
Anesthesia: General

## 2017-09-26 MED ORDER — PROPOFOL 10 MG/ML IV BOLUS
INTRAVENOUS | Status: DC | PRN
  Administered 2017-09-26: 100 mg via INTRAVENOUS

## 2017-09-26 MED ORDER — PROPOFOL 10 MG/ML IV BOLUS
INTRAVENOUS | Status: AC
  Filled 2017-09-26: qty 40

## 2017-09-26 MED ORDER — LIDOCAINE HCL (PF) 1 % IJ SOLN
INTRAMUSCULAR | Status: AC
  Administered 2017-09-26: 0.3 mL via INTRADERMAL
  Filled 2017-09-26: qty 2

## 2017-09-26 MED ORDER — FENTANYL CITRATE (PF) 100 MCG/2ML IJ SOLN
INTRAMUSCULAR | Status: DC | PRN
  Administered 2017-09-26: 50 ug via INTRAVENOUS
  Administered 2017-09-26: 25 ug via INTRAVENOUS

## 2017-09-26 MED ORDER — LIDOCAINE HCL (PF) 1 % IJ SOLN
2.0000 mL | Freq: Once | INTRAMUSCULAR | Status: AC
  Administered 2017-09-26: 0.3 mL via INTRADERMAL

## 2017-09-26 MED ORDER — SODIUM CHLORIDE 0.9 % IV SOLN
INTRAVENOUS | Status: DC
  Administered 2017-09-26: 1000 mL via INTRAVENOUS

## 2017-09-26 MED ORDER — FENTANYL CITRATE (PF) 100 MCG/2ML IJ SOLN
INTRAMUSCULAR | Status: AC
  Filled 2017-09-26: qty 2

## 2017-09-26 MED ORDER — LIDOCAINE HCL (CARDIAC) PF 100 MG/5ML IV SOSY
PREFILLED_SYRINGE | INTRAVENOUS | Status: DC | PRN
  Administered 2017-09-26: 80 mg via INTRAVENOUS

## 2017-09-26 NOTE — Anesthesia Post-op Follow-up Note (Signed)
Anesthesia QCDR form completed.        

## 2017-09-26 NOTE — Anesthesia Postprocedure Evaluation (Signed)
Anesthesia Post Note  Patient: [name]  Procedure(s) Performed: ESOPHAGOGASTRODUODENOSCOPY (EGD) WITH PROPOFOL (N/A )  Patient location during evaluation: Endoscopy Anesthesia Type: General Level of consciousness: awake and alert Pain management: pain level controlled Vital Signs Assessment: post-procedure vital signs reviewed and stable Respiratory status: spontaneous breathing, nonlabored ventilation, respiratory function stable and patient connected to nasal cannula oxygen Cardiovascular status: blood pressure returned to baseline and stable Postop Assessment: no apparent nausea or vomiting Anesthetic complications: no     Last Vitals:  Vitals:   09/26/17 0842 09/26/17 0852  BP: 140/85 125/90  Pulse: 72 64  Resp: 18 19  Temp: (!) 35.8 C   SpO2: 96% 95%    Last Pain:  Vitals:   09/26/17 0852  TempSrc:   PainSc: 0-No pain                 Hasani Diemer S

## 2017-09-26 NOTE — Op Note (Signed)
[facility]enterology Patient Name: David [name] [facility] Procedure Date: 09/26/2017 8:28 AM MRN: [id number] Account #: [id number] Date of Birth: [birth year] Admit Type: Outpatient Age: 62 Room: [facility] ENDO ROOM 2 Gender: Male Note Status: Finalized Procedure:            Upper GI endoscopy Indications:          Variceal screening (no known varices or prior                        bleeding), , moderate fibrosis of liver Providers:            Lin Landsman MD, MD Referring MD:         Ramonita Lab, MD (Referring MD) Medicines:            Monitored Anesthesia Care Complications:        No immediate complications. Estimated blood loss: None. Procedure:            Pre-Anesthesia Assessment:                       - Prior to the procedure, a History and Physical was                        performed, and patient medications and allergies were                        reviewed. The patient is competent. The risks and                        benefits of the procedure and the sedation options and                        risks were discussed with the patient. All questions                        were answered and informed consent was obtained.                        Patient identification and proposed procedure were                        verified by the physician, the nurse, the                        anesthesiologist, the anesthetist and the technician in                        the pre-procedure area in the procedure room in the                        endoscopy suite. Mental Status Examination: alert and                        oriented. Airway Examination: normal oropharyngeal                        airway and neck mobility. Respiratory Examination:                        clear to auscultation. CV Examination: normal.  Prophylactic Antibiotics: The patient does not require                        prophylactic antibiotics. Prior Anticoagulants: The                patient has taken no previous anticoagulant or                        antiplatelet agents. ASA Grade Assessment: III - A                        patient with severe systemic disease. After reviewing                        the risks and benefits, the patient was deemed in                        satisfactory condition to undergo the procedure. The                        anesthesia plan was to use monitored anesthesia care                        (MAC). Immediately prior to administration of                        medications, the patient was re-assessed for adequacy                        to receive sedatives. The heart rate, respiratory rate,                        oxygen saturations, blood pressure, adequacy of                        pulmonary ventilation, and response to care were                        monitored throughout the procedure. The physical status                        of the patient was re-assessed after the procedure.                       After obtaining informed consent, the endoscope was                        passed under direct vision. Throughout the procedure,                        the patient's blood pressure, pulse, and oxygen                        saturations were monitored continuously. The Endoscope                        was introduced through the mouth, and advanced to the                        second part of duodenum. The upper  GI endoscopy was                        accomplished without difficulty. The patient tolerated                        the procedure well. Findings:      The duodenal bulb and second portion of the duodenum were normal.      The entire examined stomach was normal.      The cardia and gastric fundus were normal on retroflexion.      Localized mild mucosal changes characterized by nodularity were found at       the gastroesophageal junction. Biopsies were taken with a cold forceps       for histology.      Small (< 5 mm)  varices were found in the lower third of the esophagus. Impression:           - Normal duodenal bulb and second portion of the                        duodenum.                       - Normal stomach.                       - Nodular mucosa in the esophagus. Biopsied.                       - Small (< 5 mm) esophageal varices. Recommendation:       - Await pathology results.                       - Discharge patient to home (with spouse).                       - Low fat diet and low sodium diet.                       - Continue present medications.                       - Repeat upper endoscopy in 3 years for surveillance.                       - Return to my office as previously scheduled. Procedure Code(s):    --- Professional ---                       [phone], Esophagogastroduodenoscopy, flexible, transoral;                        with biopsy, single or multiple Diagnosis Code(s):    --- Professional ---                       K22.8, Other specified diseases of esophagus                       I85.00, Esophageal varices without bleeding                       Z13.810, Encounter for screening for upper  gastrointestinal disorder CPT copyright 2017 American Medical Association. All rights reserved. The codes documented in this report are preliminary and upon coder review may  be revised to meet current compliance requirements. Dr. Ulyess Mort Lin Landsman MD, MD 09/26/2017 8:42:16 AM This report has been signed electronically. Number of Addenda: 0 Note Initiated On: 09/26/2017 8:28 AM      [facility]

## 2017-09-26 NOTE — Transfer of Care (Signed)
Immediate Anesthesia Transfer of Care Note  Patient: David [name]  Procedure(s) Performed: ESOPHAGOGASTRODUODENOSCOPY (EGD) WITH PROPOFOL (N/A )  Patient Location: PACU  Anesthesia Type:General  Level of Consciousness: awake, alert  and oriented  Airway & Oxygen Therapy: Patient Spontanous Breathing  Post-op Assessment: Report given to RN and Post -op Vital signs reviewed and stable  Post vital signs: Reviewed and stable  Last Vitals:  Vitals Value Taken Time  BP    Temp    Pulse    Resp    SpO2      Last Pain:  Vitals:   09/26/17 0744  TempSrc: Tympanic  PainSc: 0-No pain         Complications: No apparent anesthesia complications

## 2017-09-26 NOTE — Anesthesia Preprocedure Evaluation (Signed)
Anesthesia Evaluation  Patient identified by MRN, date of birth, ID band Patient awake    Reviewed: Allergy & Precautions, NPO status , Patient's Chart, lab work & pertinent test results, reviewed documented beta blocker date and time   Airway Mallampati: III  TM Distance: >3 FB     Dental  (+) Chipped   Pulmonary sleep apnea , pneumonia, resolved, COPD, former smoker,           Cardiovascular      Neuro/Psych    GI/Hepatic   Endo/Other  Hypothyroidism   Renal/GU      Musculoskeletal  (+) Arthritis ,   Abdominal   Peds  Hematology   Anesthesia Other Findings   Reproductive/Obstetrics                             Anesthesia Physical Anesthesia Plan  ASA: III  Anesthesia Plan: General   Post-op Pain Management:    Induction: Intravenous  PONV Risk Score and Plan:   Airway Management Planned:   Additional Equipment:   Intra-op Plan:   Post-operative Plan:   Informed Consent: I have reviewed the patients History and Physical, chart, labs and discussed the procedure including the risks, benefits and alternatives for the proposed anesthesia with the patient or authorized representative who has indicated his/her understanding and acceptance.     Plan Discussed with: CRNA  Anesthesia Plan Comments:         Anesthesia Quick Evaluation

## 2017-09-26 NOTE — H&P (Signed)
Cephas Darby, MD [address]  [place], [place] [address]  Main: [phone]  Fax: [phone] Pager: [phone]  Primary Care Physician:  Adin Hector, MD Primary Gastroenterologist:  Dr. Cephas Darby  Pre-Procedure History & Physical: HPI:  David [name] is a 62 y.o. male is here for an endoscopy.   Past Medical History:  Diagnosis Date  . B12 deficiency   . Complete tear of right rotator cuff   . Compression fracture of spine ([place])   . COPD (chronic obstructive pulmonary disease) ([place])   . DDD (degenerative disc disease), lumbar   . H/O emphysema   . Hyperlipidemia   . Hypothyroidism   . Lung nodule, multiple    3MM. AND 8 MM  . Nocturnal hypoxia   . Pneumonia   . Shortness of breath on exertion   . Sleep apnea   . Thrombocytopenia ([place])   . Thyroid disease     Past Surgical History:  Procedure Laterality Date  . BACK SURGERY  1991   L5-S1  . COLONOSCOPY    . ELBOW ARTHROSCOPY Right   . KYPHOPLASTY N/A 06/10/2016   Procedure: KYPHOPLASTY T12;  Surgeon: Hessie Knows, MD;  Location: [facility] ORS;  Service: Orthopedics;  Laterality: N/A;  . KYPHOPLASTY     T12  . SHOULDER ARTHROSCOPY    . TENODESIS LONG TENDON BICEPS Right   . TONSILLECTOMY      Prior to Admission medications   Medication Sig Start Date End Date Taking? Authorizing Provider  albuterol (PROVENTIL HFA;VENTOLIN HFA) 108 (90 Base) MCG/ACT inhaler Inhale 2 puffs into the lungs every 4 (four) hours as needed for wheezing or shortness of breath. 12/13/16   Flora Lipps, MD  ferrous sulfate 325 (65 FE) MG tablet Take 325 mg by mouth daily with breakfast.    [provider]  levothyroxine (SYNTHROID, LEVOTHROID) 112 MCG tablet Take 112 mcg by mouth daily before breakfast.    [provider]  montelukast (SINGULAIR) 10 MG tablet Take 10 mg by mouth at bedtime.    [provider]  rosuvastatin (CRESTOR) 10 MG tablet Take 10 mg by mouth daily.     [provider]  SPIRIVA RESPIMAT 2.5 MCG/ACT AERS inhale 2 puffs INTO LUNGS daily 03/30/17   Flora Lipps, MD  [facility] 160-4.5 MCG/ACT inhaler inhale 2 puffs by mouth twice a day 05/13/17   Flora Lipps, MD    Allergies as of 09/13/2017 - Review Complete 07/11/2017  Allergen Reaction Noted  . Penicillins  10/10/2015    Family History  Problem Relation Age of Onset  . Heart disease Father     Social History   Socioeconomic History  . Marital status: Married    Spouse name: Not on file  . Number of children: Not on file  . Years of education: Not on file  . Highest education level: Not on file  Occupational History  . Not on file  Social Needs  . Financial resource strain: Not on file  . Food insecurity:    Worry: Not on file    Inability: Not on file  . Transportation needs:    Medical: Not on file    Non-medical: Not on file  Tobacco Use  . Smoking status: Former Smoker    Last attempt to quit: 06/09/1984    Years since quitting: 33.3  . Smokeless tobacco: Never Used  Substance and Sexual Activity  . Alcohol use: Yes    Alcohol/week: 8.4  oz    Types: 14 Cans of beer per week    Comment: 1-2 beers daily  . Drug use: No  . Sexual activity: Not on file  Lifestyle  . Physical activity:    Days per week: Not on file    Minutes per session: Not on file  . Stress: Not on file  Relationships  . Social connections:    Talks on phone: Not on file    Gets together: Not on file    Attends religious service: Not on file    Active member of club or organization: Not on file    Attends meetings of clubs or organizations: Not on file    Relationship status: Not on file  . Intimate partner violence:    Fear of current or ex partner: Not on file    Emotionally abused: Not on file    Physically abused: Not on file    Forced sexual activity: Not on file  Other Topics Concern  . Not on file  Social History Narrative  . Not on file    Review of Systems: See  HPI, otherwise negative ROS  Physical Exam: BP 132/81   Pulse 68   Temp (!) 96 F (35.6 C) (Tympanic)   Resp 16   Ht 5\' 10"  (1.778 m)   Wt 188 lb (85.3 kg)   SpO2 99%   BMI 26.98 kg/m  General:   Alert,  pleasant and cooperative in NAD Head:  Normocephalic and atraumatic. Neck:  Supple; no masses or thyromegaly. Lungs:  Clear throughout to auscultation.    Heart:  Regular rate and rhythm. Abdomen:  Soft, nontender and nondistended. Normal bowel sounds, without guarding, and without rebound.   Neurologic:  Alert and  oriented x4;  grossly normal neurologically.  Impression/Plan: [name] is here for an endoscopy to be performed for variceal screening  Risks, benefits, limitations, and alternatives regarding  endoscopy have been reviewed with the patient.  Questions have been answered.  All parties agreeable.   Sherri Sear, MD  09/26/2017, 8:25 AM

## 2017-09-27 LAB — SURGICAL PATHOLOGY

## 2017-09-29 ENCOUNTER — Encounter: Payer: Self-pay | Admitting: Gastroenterology

## 2017-10-05 ENCOUNTER — Inpatient Hospital Stay: Payer: BLUE CROSS/BLUE SHIELD | Admitting: Oncology

## 2017-10-05 ENCOUNTER — Inpatient Hospital Stay: Payer: BLUE CROSS/BLUE SHIELD

## 2017-12-26 ENCOUNTER — Other Ambulatory Visit: Payer: Self-pay | Admitting: Internal Medicine

## 2017-12-26 DIAGNOSIS — J449 Chronic obstructive pulmonary disease, unspecified: Secondary | ICD-10-CM

## 2017-12-30 ENCOUNTER — Other Ambulatory Visit: Payer: Self-pay | Admitting: Internal Medicine

## 2017-12-30 DIAGNOSIS — J449 Chronic obstructive pulmonary disease, unspecified: Secondary | ICD-10-CM

## 2018-01-11 ENCOUNTER — Ambulatory Visit: Payer: BLUE CROSS/BLUE SHIELD | Admitting: Internal Medicine

## 2018-01-12 ENCOUNTER — Encounter: Payer: Self-pay | Admitting: Internal Medicine

## 2018-01-12 ENCOUNTER — Ambulatory Visit: Payer: BLUE CROSS/BLUE SHIELD | Admitting: Internal Medicine

## 2018-01-12 VITALS — BP 120/62 | HR 89 | Resp 16 | Ht 70.0 in | Wt 191.0 lb

## 2018-01-12 DIAGNOSIS — G4733 Obstructive sleep apnea (adult) (pediatric): Secondary | ICD-10-CM | POA: Diagnosis not present

## 2018-01-12 DIAGNOSIS — J449 Chronic obstructive pulmonary disease, unspecified: Secondary | ICD-10-CM | POA: Diagnosis not present

## 2018-01-12 NOTE — Progress Notes (Signed)
[facility] Consultation      Date: 01/12/2018,   MRN# [id number] David [name] 1955-08-02  David [name] is a 62 y.o. old male seen in consultation for COPD   Synopsis estabished care in 2017 for Severe COPD PFT's 10/2015 Fev1 28% 1.03L  6MWT WNL 95% o2 sat at 1083 feet ONO +for hypoxia on oxygen   CTc hes  CHIEF COMPLAINT:  Follow up SOB and DOE Follow up COPD and OSA SOB  HISTORY OF PRESENT ILLNESS   Here for follow-up COPD assessment Diagnosed in 1998 Previous smoker  on Symbicort and Spiriva-seems to be responding to this regimen  Patient remains fatigued and dyspnea on exertion Due to his COPD  Patient has a diagnosis of sleep apnea Tolerating CPAP as prescribed Compliance report shows 93% compliance 4 days and 93% compliance for greater than 4 hours with an AHI of 0.2  Patient is less tired and less fatigued Patient uses and benefits from CPAP therapy Auto CPAP 7 to 18 cm water pressure  No signs of infection at this time No signs of heart failure at this time    CT chest 3.15.19 Reviewed with patient +emphysema No suspicious nodules, massess No effusions No pneumonia     No signs of infection at this time Current Medication:  Current Outpatient Medications:  .  levothyroxine (SYNTHROID, LEVOTHROID) 112 MCG tablet, Take 112 mcg by mouth daily before breakfast., Disp: , Rfl:  .  montelukast (SINGULAIR) 10 MG tablet, Take 10 mg by mouth at bedtime., Disp: , Rfl:  .  PROAIR HFA 108 (90 Base) MCG/ACT inhaler, INHALE 2 PUFFS BY MOUTH INTO THE LUNGS EVERY 4 HOURS AS NEEDED FOR WHEEZING, Disp: 25.5 g, Rfl: 0 .  SPIRIVA RESPIMAT 2.5 MCG/ACT AERS, inhale 2 puffs INTO LUNGS daily, Disp: 12 g, Rfl: 5 .  SYMBICORT 160-4.5 MCG/ACT inhaler, INHALE 2 PUFFS BY MOUTH TWICE A DAY, Disp: 30.6 g, Rfl: 0 .  rosuvastatin (CRESTOR) 10 MG tablet, Take 10 mg by mouth daily., Disp: , Rfl:     ALLERGIES   Penicillins     REVIEW OF  SYSTEMS   Review of Systems  Constitutional: Negative for chills, diaphoresis, fever, malaise/fatigue and weight loss.  HENT: Negative for congestion and hearing loss.   Respiratory: Positive for shortness of breath. Negative for cough, hemoptysis, sputum production and wheezing.   Cardiovascular: Negative for chest pain, palpitations, orthopnea and leg swelling.  Gastrointestinal: Negative for heartburn and nausea.  Skin: Negative for rash.  Neurological: Negative for weakness and headaches.  Psychiatric/Behavioral: Negative for depression.  All other systems reviewed and are negative.     PHYSICAL EXAM  Physical Exam  Constitutional: He is oriented to person, place, and time. He appears well-developed and well-nourished. No distress.  Cardiovascular: Normal rate, regular rhythm and normal heart sounds.  No murmur heard. Pulmonary/Chest: No respiratory distress. He has no wheezes. He has no rales.  Musculoskeletal: Normal range of motion. He exhibits no edema.  Neurological: He is alert and oriented to person, place, and time. No cranial nerve deficit.  Skin: Skin is warm. He is not diaphoretic.  Psychiatric: He has a normal mood and affect.    CT chest 3.15.19 Emphysematous changes, pulmonary scarring and patchy areas of peribronchial thickening and minimal bronchiectasis. No suspicious nodules, masses   ASSESSMENT/PLAN   62 year old pleasant white male seen today for very severe COPD Gold stage D respiratory status is at baseline is poor however patient is tolerating  also has a diagnosis of OSA on CPAP therapy Patient cannot afford pulmonary rehab at this time, patient compliant with his medicines and CPAP therapy  #1 shortness of breath and dyspnea on exertion related to COPD  #2  severe COPD Continue Symbicort as prescribed Spiriva as prescribed Albuterol as needed Theophylline is not an option at this time   #3 OSA Continue auto CPAP therapy 7-18 cm  H20   Patient satisfied with Plan of action and management. All questions answered Follow up in 6 months  Livi Mcgann Patricia Pesa, M.D.  [facility]  Medical Director [place] Director [facility] Cardio-Pulmonary Department

## 2018-01-12 NOTE — Patient Instructions (Signed)
Continue Inhalers as prescribed ALBUTEROL prior to exercise  Continue CPAP therapy as prescribed

## 2018-04-02 ENCOUNTER — Other Ambulatory Visit: Payer: Self-pay | Admitting: Internal Medicine

## 2018-04-22 ENCOUNTER — Other Ambulatory Visit: Payer: Self-pay | Admitting: Internal Medicine

## 2018-04-22 ENCOUNTER — Other Ambulatory Visit: Payer: Self-pay

## 2018-04-22 ENCOUNTER — Ambulatory Visit
Admission: EM | Admit: 2018-04-22 | Discharge: 2018-04-22 | Disposition: A | Discharge status: Home / Self Care | Payer: BLUE CROSS/BLUE SHIELD | Attending: Family Medicine | Admitting: Family Medicine

## 2018-04-22 DIAGNOSIS — L03213 Periorbital cellulitis: Secondary | ICD-10-CM

## 2018-04-22 DIAGNOSIS — J449 Chronic obstructive pulmonary disease, unspecified: Secondary | ICD-10-CM

## 2018-04-22 MED ORDER — SULFAMETHOXAZOLE-TRIMETHOPRIM 800-160 MG PO TABS: 1.0000 | ORAL_TABLET | Freq: Two times a day (BID) | ORAL | 0 refills | Status: DC

## 2018-04-22 NOTE — ED Triage Notes (Signed)
Patient complains of area around eye that is hurting. Patient states that he started noticing it on Tuesday or Wednesday. Patient states that pain around eye seems to be worsening.
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
## 2018-05-23 ENCOUNTER — Other Ambulatory Visit: Payer: Self-pay | Admitting: Internal Medicine

## 2018-05-23 DIAGNOSIS — J449 Chronic obstructive pulmonary disease, unspecified: Secondary | ICD-10-CM

## 2018-08-03 ENCOUNTER — Other Ambulatory Visit: Payer: Self-pay | Admitting: Internal Medicine

## 2018-08-03 DIAGNOSIS — J449 Chronic obstructive pulmonary disease, unspecified: Secondary | ICD-10-CM

## 2018-08-03 MED ORDER — BUDESONIDE-FORMOTEROL FUMARATE 160-4.5 MCG/ACT IN AERO: 2.0000 | INHALATION_SPRAY | Freq: Two times a day (BID) | RESPIRATORY_TRACT | 0 refills | Status: DC

## 2018-08-03 NOTE — Telephone Encounter (Signed)
Received Rx request from [facility] for Symbicort 160. Rx for Symbicort has been sent to preferred pharmacy, as pt was instructed to continue this medication at last OV. Nothing further is needed.

## 2018-08-11 ENCOUNTER — Encounter: Payer: Self-pay | Admitting: Internal Medicine

## 2018-08-11 ENCOUNTER — Other Ambulatory Visit: Payer: Self-pay

## 2018-08-11 ENCOUNTER — Ambulatory Visit: Payer: BLUE CROSS/BLUE SHIELD | Admitting: Internal Medicine

## 2018-08-11 VITALS — BP 124/60 | HR 77 | Ht 69.0 in | Wt 191.2 lb

## 2018-08-11 DIAGNOSIS — J309 Allergic rhinitis, unspecified: Secondary | ICD-10-CM

## 2018-08-11 DIAGNOSIS — J449 Chronic obstructive pulmonary disease, unspecified: Secondary | ICD-10-CM | POA: Diagnosis not present

## 2018-08-11 MED ORDER — PREDNISONE 20 MG PO TABS: 20.0000 mg | ORAL_TABLET | Freq: Every day | ORAL | 1 refills | Status: DC

## 2018-08-11 MED ORDER — MOMETASONE FUROATE 50 MCG/ACT NA SUSP: 2.0000 | Freq: Every day | NASAL | 2 refills | Status: DC

## 2018-08-11 NOTE — Patient Instructions (Signed)
STOP SINGULAIR  START NASONEX 2 sprays each day  Continue Inhalers as prescribed  Continue CPAP as prescribed

## 2018-08-11 NOTE — Progress Notes (Signed)
[facility] Consultation      Date: 08/11/2018,   MRN# [id number] KASHIUS DOMINIC III 26-Oct-1955  CHANE COWDEN III is a 63 y.o. old male seen in consultation for COPD   Synopsis estabished care in 2017 for Severe COPD PFT's 10/2015 Fev1 28% 1.03L  6MWT WNL 95% o2 sat at 1083 feet ONO +for hypoxia on oxygen  CT chest 3.15.19 Reviewed with patient +emphysema No suspicious nodules, massess No effusions No pneumonia  CTc hes  CHIEF COMPLAINT:  Follow up COPD Follow up OSA SOB  HISTORY OF PRESENT ILLNESS   Follow up COPD seems to be stable FEV1 28% very severe Stays active Dx in 1998 Previous smoker   on Symbicort and Spiriva-seems to be responding to this regimen  Remains fatigued and and with DOE stable Due to COPD  Compliacne report shared with patient AHI 0.1 100% compliance AUTOCPAP 7-18   Patient using and tolerating CPAP as prescribed No signs of infection at this time No signs of acute COPD exacerbation at this time No signs of heart failure at this time  Patient uses Singulair for allergies but does not know if this helps Patient is willing to try nasal sprays    Current Medication:  Current Outpatient Medications:  .  budesonide-formoterol (SYMBICORT) 160-4.5 MCG/ACT inhaler, Inhale 2 puffs into the lungs 2 (two) times daily., Disp: 30.6 g, Rfl: 0 .  levothyroxine (SYNTHROID, LEVOTHROID) 112 MCG tablet, Take 112 mcg by mouth daily before breakfast., Disp: , Rfl:  .  montelukast (SINGULAIR) 10 MG tablet, Take 10 mg by mouth at bedtime., Disp: , Rfl:  .  PROAIR HFA 108 (90 Base) MCG/ACT inhaler, INHALE 2 PUFFS BY MOUTH INTO THE LUNGS EVERY 4 HOURS AS NEEDED, Disp: 25.5 g, Rfl: 2 .  rosuvastatin (CRESTOR) 10 MG tablet, Take 10 mg by mouth daily., Disp: , Rfl:  .  SPIRIVA RESPIMAT 2.5 MCG/ACT AERS, INHALE 2 PUFFS INTO LUNGS DAILY, Disp: 12 g, Rfl: 5    ALLERGIES   Penicillins     REVIEW OF SYSTEMS   Review of Systems   Constitutional: Negative for chills, diaphoresis, fever, malaise/fatigue and weight loss.  HENT: Negative for congestion and hearing loss.   Respiratory: Positive for shortness of breath. Negative for cough, hemoptysis, sputum production and wheezing.   Cardiovascular: Negative for chest pain, palpitations, orthopnea and leg swelling.  Gastrointestinal: Negative for heartburn and nausea.  Skin: Negative for rash.  Neurological: Negative for weakness and headaches.  Psychiatric/Behavioral: Negative for depression.  All other systems reviewed and are negative.     PHYSICAL EXAM  Physical Exam  Constitutional: He is oriented to person, place, and time. He appears well-developed and well-nourished. No distress.  Cardiovascular: Normal rate, regular rhythm and normal heart sounds.  No murmur heard. Pulmonary/Chest: No respiratory distress. He has no wheezes. He has no rales.  Musculoskeletal: Normal range of motion.        General: No edema.  Neurological: He is alert and oriented to person, place, and time. No cranial nerve deficit.  Skin: Skin is warm. He is not diaphoretic.  Psychiatric: He has a normal mood and affect.    CT chest 3.15.19 Emphysematous changes, pulmonary scarring and patchy areas of peribronchial thickening and minimal bronchiectasis. No suspicious nodules, masses   ASSESSMENT/PLAN   63 year old pleasant white male seen today for follow-up very severe COPD Gold stage D with chronic respiratory insufficiency chronic shortness of breath but seems to be stable at this time  Patient also has a diagnosis of sleep apnea on CPAP therapy which she is benefiting and tolerating well  Shortness of breath and dyspnea exertion related to his COPD No indication for oxygen with exertion at this time   Very severe COPD Gold stage D Continue Symbicort and Spiriva as prescribed Albuterol as needed I have prescribed prednisone therapy just in case he needs it for an  exacerbation   Allergic rhinitis Stop Singulair Start Nasonex nasal sprays   OSA on CPAP Patient using and benefiting from therapy Auto CPAP 7-18 AHI 0.1 100% compliance    Patient satisfied with Plan of action and management. All questions answered Follow-up in 6 months   Evertt Chouinard Patricia Pesa, M.D.  [facility]  Medical Director [place] Director [facility] Cardio-Pulmonary Department

## 2018-11-28 ENCOUNTER — Other Ambulatory Visit: Payer: Self-pay

## 2018-11-28 DIAGNOSIS — J309 Allergic rhinitis, unspecified: Secondary | ICD-10-CM

## 2018-11-28 MED ORDER — MOMETASONE FUROATE 50 MCG/ACT NA SUSP: 2.0000 | Freq: Every day | NASAL | 2 refills | Status: DC

## 2018-12-07 ENCOUNTER — Other Ambulatory Visit: Payer: Self-pay | Admitting: Internal Medicine

## 2018-12-07 DIAGNOSIS — J449 Chronic obstructive pulmonary disease, unspecified: Secondary | ICD-10-CM

## 2018-12-07 MED ORDER — BUDESONIDE-FORMOTEROL FUMARATE 160-4.5 MCG/ACT IN AERO: 2.0000 | INHALATION_SPRAY | Freq: Two times a day (BID) | RESPIRATORY_TRACT | 2 refills | Status: DC

## 2019-02-07 ENCOUNTER — Other Ambulatory Visit: Payer: Self-pay | Admitting: Internal Medicine

## 2019-02-07 DIAGNOSIS — K7469 Other cirrhosis of liver: Secondary | ICD-10-CM

## 2019-02-14 ENCOUNTER — Other Ambulatory Visit: Payer: Self-pay

## 2019-02-14 ENCOUNTER — Ambulatory Visit
Admission: RE | Admit: 2019-02-14 | Discharge: 2019-02-14 | Disposition: A | Discharge status: Home / Self Care | Payer: BC Managed Care – PPO | Source: Ambulatory Visit | Attending: Internal Medicine | Admitting: Internal Medicine

## 2019-02-14 DIAGNOSIS — K7469 Other cirrhosis of liver: Secondary | ICD-10-CM | POA: Insufficient documentation

## 2019-03-07 ENCOUNTER — Encounter: Payer: Self-pay | Admitting: Internal Medicine

## 2019-03-07 ENCOUNTER — Ambulatory Visit (INDEPENDENT_AMBULATORY_CARE_PROVIDER_SITE_OTHER): Payer: BC Managed Care – PPO | Admitting: Internal Medicine

## 2019-03-07 DIAGNOSIS — J449 Chronic obstructive pulmonary disease, unspecified: Secondary | ICD-10-CM | POA: Diagnosis not present

## 2019-03-07 DIAGNOSIS — G4733 Obstructive sleep apnea (adult) (pediatric): Secondary | ICD-10-CM

## 2019-03-07 NOTE — Patient Instructions (Signed)
Continue AUTOCPAP AS PRESCRIBED Use inhalers as prescribed    COVID-19 COVID-19 is a respiratory infection that is caused by a virus called severe acute respiratory syndrome coronavirus 2 (SARS-CoV-2). The disease is also known as coronavirus disease or novel coronavirus. In some people, the virus may not cause any symptoms. In others, it may cause a serious infection. The infection can get worse quickly and can lead to complications, such as:  Pneumonia, or infection of the lungs.  Acute respiratory distress syndrome or ARDS. This is fluid build-up in the lungs.  Acute respiratory failure. This is a condition in which there is not enough oxygen passing from the lungs to the body.  Sepsis or septic shock. This is a serious bodily reaction to an infection.  Blood clotting problems.  Secondary infections due to bacteria or fungus. The virus that causes COVID-19 is contagious. This means that it can spread from person to person through droplets from coughs and sneezes (respiratory secretions). What are the causes? This illness is caused by a virus. You may catch the virus by:  Breathing in droplets from an infected person's cough or sneeze.  Touching something, like a table or a doorknob, that was exposed to the virus (contaminated) and then touching your mouth, nose, or eyes. What increases the risk? Risk for infection You are more likely to be infected with this virus if you:  Live in or travel to an area with a COVID-19 outbreak.  Come in contact with a sick person who recently traveled to an area with a COVID-19 outbreak.  Provide care for or live with a person who is infected with COVID-19. Risk for serious illness You are more likely to become seriously ill from the virus if you:  Are 23 years of age or older.  Have a long-term disease that lowers your body's ability to fight infection (immunocompromised).  Live in a nursing home or long-term care facility.  Have a  long-term (chronic) disease such as: ? Chronic lung disease, including chronic obstructive pulmonary disease or asthma ? Heart disease. ? Diabetes. ? Chronic kidney disease. ? Liver disease.  Are obese. What are the signs or symptoms? Symptoms of this condition can range from mild to severe. Symptoms may appear any time from 2 to 14 days after being exposed to the virus. They include:  A fever.  A cough.  Difficulty breathing.  Chills.  Muscle pains.  A sore throat.  Loss of taste or smell. Some people may also have stomach problems, such as nausea, vomiting, or diarrhea. Other people may not have any symptoms of COVID-19. How is this diagnosed? This condition may be diagnosed based on:  Your signs and symptoms, especially if: ? You live in an area with a COVID-19 outbreak. ? You recently traveled to or from an area where the virus is common. ? You provide care for or live with a person who was diagnosed with COVID-19.  A physical exam.  Lab tests, which may include: ? A nasal swab to take a sample of fluid from your nose. ? A throat swab to take a sample of fluid from your throat. ? A sample of mucus from your lungs (sputum). ? Blood tests.  Imaging tests, which may include, X-rays, CT scan, or ultrasound. How is this treated? At present, there is no medicine to treat COVID-19. Medicines that treat other diseases are being used on a trial basis to see if they are effective against COVID-19. Your health care provider will  talk with you about ways to treat your symptoms. For most people, the infection is mild and can be managed at home with rest, fluids, and over-the-counter medicines. Treatment for a serious infection usually takes places in a hospital intensive care unit (ICU). It may include one or more of the following treatments. These treatments are given until your symptoms improve.  Receiving fluids and medicines through an IV.  Supplemental oxygen. Extra  oxygen is given through a tube in the nose, a face mask, or a hood.  Positioning you to lie on your stomach (prone position). This makes it easier for oxygen to get into the lungs.  Continuous positive airway pressure (CPAP) or bi-level positive airway pressure (BPAP) machine. This treatment uses mild air pressure to keep the airways open. A tube that is connected to a motor delivers oxygen to the body.  Ventilator. This treatment moves air into and out of the lungs by using a tube that is placed in your windpipe.  Tracheostomy. This is a procedure to create a hole in the neck so that a breathing tube can be inserted.  Extracorporeal membrane oxygenation (ECMO). This procedure gives the lungs a chance to recover by taking over the functions of the heart and lungs. It supplies oxygen to the body and removes carbon dioxide. Follow these instructions at home: Lifestyle  If you are sick, stay home except to get medical care. Your health care provider will tell you how long to stay home. Call your health care provider before you go for medical care.  Rest at home as told by your health care provider.  Do not use any products that contain nicotine or tobacco, such as cigarettes, e-cigarettes, and chewing tobacco. If you need help quitting, ask your health care provider.  Return to your normal activities as told by your health care provider. Ask your health care provider what activities are safe for you. General instructions  Take over-the-counter and prescription medicines only as told by your health care provider.  Drink enough fluid to keep your urine pale yellow.  Keep all follow-up visits as told by your health care provider. This is important. How is this prevented?  There is no vaccine to help prevent COVID-19 infection. However, there are steps you can take to protect yourself and others from this virus. To protect yourself:   Do not travel to areas where COVID-19 is a risk. The  areas where COVID-19 is reported change often. To identify high-risk areas and travel restrictions, check the CDC travel website: FatFares.com.br  If you live in, or must travel to, an area where COVID-19 is a risk, take precautions to avoid infection. ? Stay away from people who are sick. ? Wash your hands often with soap and water for 20 seconds. If soap and water are not available, use an alcohol-based hand sanitizer. ? Avoid touching your mouth, face, eyes, or nose. ? Avoid going out in public, follow guidance from your state and local health authorities. ? If you must go out in public, wear a cloth face covering or face mask. ? Disinfect objects and surfaces that are frequently touched every day. This may include:  Counters and tables.  Doorknobs and light switches.  Sinks and faucets.  Electronics, such as phones, remote controls, keyboards, computers, and tablets. To protect others: If you have symptoms of COVID-19, take steps to prevent the virus from spreading to others.  If you think you have a COVID-19 infection, contact your health care provider right  away. Tell your health care team that you think you may have a COVID-19 infection.  Stay home. Leave your house only to seek medical care. Do not use public transport.  Do not travel while you are sick.  Wash your hands often with soap and water for 20 seconds. If soap and water are not available, use alcohol-based hand sanitizer.  Stay away from other members of your household. Let healthy household members care for children and pets, if possible. If you have to care for children or pets, wash your hands often and wear a mask. If possible, stay in your own room, separate from others. Use a different bathroom.  Make sure that all people in your household wash their hands well and often.  Cough or sneeze into a tissue or your sleeve or elbow. Do not cough or sneeze into your hand or into the air.  Wear a cloth  face covering or face mask. Where to find more information  Centers for Disease Control and Prevention: PurpleGadgets.be  World Health Organization: https://www.castaneda.info/ Contact a health care provider if:  You live in or have traveled to an area where COVID-19 is a risk and you have symptoms of the infection.  You have had contact with someone who has COVID-19 and you have symptoms of the infection. Get help right away if:  You have trouble breathing.  You have pain or pressure in your chest.  You have confusion.  You have bluish lips and fingernails.  You have difficulty waking from sleep.  You have symptoms that get worse. These symptoms may represent a serious problem that is an emergency. Do not wait to see if the symptoms will go away. Get medical help right away. Call your local emergency services (911 in the [place] Do not drive yourself to the hospital. Let the emergency medical personnel know if you think you have COVID-19. Summary  COVID-19 is a respiratory infection that is caused by a virus. It is also known as coronavirus disease or novel coronavirus. It can cause serious infections, such as pneumonia, acute respiratory distress syndrome, acute respiratory failure, or sepsis.  The virus that causes COVID-19 is contagious. This means that it can spread from person to person through droplets from coughs and sneezes.  You are more likely to develop a serious illness if you are 71 years of age or older, have a weak immunity, live in a nursing home, or have chronic disease.  There is no medicine to treat COVID-19. Your health care provider will talk with you about ways to treat your symptoms.  Take steps to protect yourself and others from infection. Wash your hands often and disinfect objects and surfaces that are frequently touched every day. Stay away from people who are sick and wear a mask if you are sick. This information  is not intended to replace advice given to you by your health care provider. Make sure you discuss any questions you have with your health care provider. Document Released: 04/27/2018 Document Revised: 08/17/2018 Document Reviewed: 04/27/2018 Elsevier Patient Education  2020 [employer].

## 2019-03-07 NOTE — Progress Notes (Signed)
[name] Consultation     I connected with the patient by telephone enabled telemedicine visit and verified that I am speaking with the correct person using two identifiers.    I discussed the limitations, risks, security and privacy concerns of performing an evaluation and management service by telemedicine and the availability of in-person appointments. I also discussed with the patient that there may be a patient responsible charge related to this service. The patient expressed understanding and agreed to proceed.  PATIENT AGREES AND CONFIRMS -YES   Other persons participating in the visit and their role in the encounter: Patient, nursing  This visit type was conducted due to national recommendations for restrictions regarding the COVID-19 Pandemic (e.g. social distancing).  This format is felt to be most appropriate for this patient at this time.  All issues noted in this document were discussed and addressed.        Date: 03/07/2019,   MRN# [id number] David [name] 08/02/1955  David [name] is a 63 y.o. old male seen in consultation for COPD   Synopsis estabished care in 2017 for Severe COPD PFT's 10/2015 Fev1 28% 1.03L  6MWT WNL 95% o2 sat at 1083 feet ONO +for hypoxia on oxygen  CT chest 3.15.19 Reviewed with patient +emphysema No suspicious nodules, massess No effusions No pneumonia  CTc hes  CHIEF COMPLAINT:  Follow up OSA Follow up COPD   HISTORY OF PRESENT ILLNESS   COPD seems to be stable FEV1 28% very severe  Staying active Dx COPD 1998 Previous smoker Continue Symbicort and Spiriva as prescribed  Chronic shortness of breath and dyspnea exertion is stable  Patient has excellent compliance with CPAP therapy   Compliance report shared with patient AHI 0.1 100% compliance AUTOCPAP 7-18   No  COPD exacerbation at this time No evidence of heart failure at this time No evidence or signs of infection at this time No  respiratory distress No fevers, chills, nausea, vomiting, diarrhea No evidence of lower extremity edema No evidence hemoptysis   Current Medication:  Current Outpatient Medications:  .  budesonide-formoterol (SYMBICORT) 160-4.5 MCG/ACT inhaler, Inhale 2 puffs into the lungs 2 (two) times daily., Disp: 30.6 g, Rfl: 2 .  levothyroxine (SYNTHROID, LEVOTHROID) 112 MCG tablet, Take 112 mcg by mouth daily before breakfast., Disp: , Rfl:  .  mometasone (NASONEX) 50 MCG/ACT nasal spray, Place 2 sprays into the nose daily., Disp: 17 g, Rfl: 2 .  montelukast (SINGULAIR) 10 MG tablet, Take 10 mg by mouth at bedtime., Disp: , Rfl:  .  predniSONE (DELTASONE) 20 MG tablet, Take 1 tablet (20 mg total) by mouth daily with breakfast. 10 days, Disp: 10 tablet, Rfl: 1 .  PROAIR HFA 108 (90 Base) MCG/ACT inhaler, INHALE 2 PUFFS BY MOUTH INTO THE LUNGS EVERY 4 HOURS AS NEEDED, Disp: 25.5 g, Rfl: 2 .  rosuvastatin (CRESTOR) 10 MG tablet, Take 10 mg by mouth daily., Disp: , Rfl:  .  SPIRIVA RESPIMAT 2.5 MCG/ACT AERS, INHALE 2 PUFFS INTO LUNGS DAILY, Disp: 12 g, Rfl: 5    ALLERGIES   Penicillins     REVIEW OF SYSTEMS   Review of Systems  Constitutional: Negative for chills, diaphoresis, fever, malaise/fatigue and weight loss.  HENT: Negative for congestion and hearing loss.   Respiratory: Positive for shortness of breath. Negative for cough, hemoptysis, sputum production and wheezing.   Cardiovascular: Negative for chest pain, palpitations, orthopnea and leg swelling.  Gastrointestinal: Negative for heartburn and nausea.  Skin: Negative for rash.  Neurological: Negative for weakness and headaches.  Psychiatric/Behavioral: Negative for depression.  All other systems reviewed and are negative.      CT chest 3.15.19 Emphysematous changes, pulmonary scarring and patchy areas of peribronchial thickening and minimal bronchiectasis. No suspicious nodules, masses   ASSESSMENT/PLAN   63 year old  pleasant white male seen today for follow-up very severe COPD Gold stage D with chronic respiratory insufficiency chronic shortness of breath but seems to be stable at this time Patient also has a diagnosis of sleep apnea on CPAP therapy which she is benefiting and tolerating well    Very severe COPD GOLD STAGE D Continue Symbicort and Spiriva as prescribed Albuterol as needed No signs of exacerbation at this time   OSA Patient using and benefiting from therapy Auto CPAP 7-18 AHI 0.1 100% compliance is excellent    Shortness of breath and dyspnea exertion related to his COPD No indication for oxygen with exertion at this time     COVID-19 EDUCATION: The signs and symptoms of COVID-19 were discussed with the patient and how to seek care for testing.  The importance of social distancing was discussed today. Hand Washing Techniques and avoid touching face was advised.     MEDICATION ADJUSTMENTS/LABS AND TESTS ORDERED: Continue AUTOCPAP AS PRESCRIBED Use inhalers as prescribed   CURRENT MEDICATIONS REVIEWED AT LENGTH WITH PATIENT TODAY   Patient satisfied with Plan of action and management. All questions answered  Follow up in 6 months Total Time spent 22  mins  [name] Patricia Pesa, M.D.  [facility]  Medical Director [place] Director [facility] Cardio-Pulmonary Department

## 2019-04-17 ENCOUNTER — Encounter: Payer: Self-pay | Admitting: Emergency Medicine

## 2019-04-17 ENCOUNTER — Emergency Department
Admission: EM | Admit: 2019-04-17 | Discharge: 2019-04-17 | Disposition: A | Discharge status: Home / Self Care | Payer: BC Managed Care – PPO | Attending: Emergency Medicine | Admitting: Emergency Medicine

## 2019-04-17 ENCOUNTER — Emergency Department: Payer: BC Managed Care – PPO

## 2019-04-17 ENCOUNTER — Other Ambulatory Visit: Payer: Self-pay

## 2019-04-17 DIAGNOSIS — E039 Hypothyroidism, unspecified: Secondary | ICD-10-CM | POA: Diagnosis not present

## 2019-04-17 DIAGNOSIS — J449 Chronic obstructive pulmonary disease, unspecified: Secondary | ICD-10-CM | POA: Diagnosis not present

## 2019-04-17 DIAGNOSIS — M542 Cervicalgia: Secondary | ICD-10-CM | POA: Diagnosis present

## 2019-04-17 DIAGNOSIS — Z79899 Other long term (current) drug therapy: Secondary | ICD-10-CM | POA: Insufficient documentation

## 2019-04-17 DIAGNOSIS — M4802 Spinal stenosis, cervical region: Secondary | ICD-10-CM | POA: Diagnosis not present

## 2019-04-17 DIAGNOSIS — Z87891 Personal history of nicotine dependence: Secondary | ICD-10-CM | POA: Insufficient documentation

## 2019-04-17 DIAGNOSIS — M5020 Other cervical disc displacement, unspecified cervical region: Secondary | ICD-10-CM | POA: Insufficient documentation

## 2019-04-17 DIAGNOSIS — M5412 Radiculopathy, cervical region: Secondary | ICD-10-CM | POA: Diagnosis not present

## 2019-04-17 LAB — CBC WITH DIFFERENTIAL/PLATELET
Abs Immature Granulocytes: 0.02 10*3/uL (ref 0.00–0.07)
Basophils Absolute: 0 10*3/uL (ref 0.0–0.1)
Basophils Relative: 0 %
Eosinophils Absolute: 0.1 10*3/uL (ref 0.0–0.5)
Eosinophils Relative: 2 %
HCT: 43.4 % (ref 39.0–52.0)
Hemoglobin: 14.6 g/dL (ref 13.0–17.0)
Immature Granulocytes: 0 %
Lymphocytes Relative: 37 %
Lymphs Abs: 2.2 10*3/uL (ref 0.7–4.0)
MCH: 33.4 pg (ref 26.0–34.0)
MCHC: 33.6 g/dL (ref 30.0–36.0)
MCV: 99.3 fL (ref 80.0–100.0)
Monocytes Absolute: 0.5 10*3/uL (ref 0.1–1.0)
Monocytes Relative: 8 %
Neutro Abs: 3.1 10*3/uL (ref 1.7–7.7)
Neutrophils Relative %: 53 %
Platelets: 122 10*3/uL — ABNORMAL LOW (ref 150–400)
RBC: 4.37 MIL/uL (ref 4.22–5.81)
RDW: 12.2 % (ref 11.5–15.5)
WBC: 5.8 10*3/uL (ref 4.0–10.5)
nRBC: 0 % (ref 0.0–0.2)

## 2019-04-17 LAB — COMPREHENSIVE METABOLIC PANEL
ALT: 27 U/L (ref 0–44)
AST: 16 U/L (ref 15–41)
Albumin: 4.3 g/dL (ref 3.5–5.0)
Alkaline Phosphatase: 31 U/L — ABNORMAL LOW (ref 38–126)
Anion gap: 7 (ref 5–15)
BUN: 15 mg/dL (ref 8–23)
CO2: 27 mmol/L (ref 22–32)
Calcium: 9.2 mg/dL (ref 8.9–10.3)
Chloride: 105 mmol/L (ref 98–111)
Creatinine, Ser: 0.92 mg/dL (ref 0.61–1.24)
GFR calc Af Amer: >60 mL/min (ref >60)
GFR calc non Af Amer: >60 mL/min (ref >60)
Glucose, Bld: 107 mg/dL — ABNORMAL HIGH (ref 70–99)
Potassium: 4.1 mmol/L (ref 3.5–5.1)
Sodium: 139 mmol/L (ref 135–145)
Total Bilirubin: 0.7 mg/dL (ref 0.3–1.2)
Total Protein: 6.9 g/dL (ref 6.5–8.1)

## 2019-04-17 LAB — TROPONIN I (HIGH SENSITIVITY)
Troponin I (High Sensitivity): 3 ng/L (ref <18)
Troponin I (High Sensitivity): <2 ng/L (ref <18)

## 2019-04-17 MED ORDER — OXYCODONE-ACETAMINOPHEN 7.5-325 MG PO TABS
1.0000 | ORAL_TABLET | Freq: Once | ORAL | Status: AC
  Administered 2019-04-17: 1 via ORAL
  Filled 2019-04-17: qty 1

## 2019-04-17 MED ORDER — ORPHENADRINE CITRATE 30 MG/ML IJ SOLN
60.0000 mg | Freq: Two times a day (BID) | INTRAMUSCULAR | Status: DC
  Administered 2019-04-17: 60 mg via INTRAMUSCULAR
  Filled 2019-04-17: qty 2

## 2019-04-17 MED ORDER — OXYCODONE-ACETAMINOPHEN 7.5-325 MG PO TABS: 1.0000 | ORAL_TABLET | Freq: Four times a day (QID) | ORAL | 0 refills | Status: DC | PRN

## 2019-04-17 MED ORDER — PREDNISONE 10 MG PO TABS: ORAL_TABLET | ORAL | 0 refills | Status: DC

## 2019-04-17 NOTE — ED Notes (Signed)
This rn gave pt crackers so he would have something on his stomach with pain pill, pt appreciative.

## 2019-04-17 NOTE — ED Notes (Signed)
Pt appears in no distress, informed him to call his ride. Will bring d/c papers as soon as ready.

## 2019-04-17 NOTE — ED Notes (Signed)
Walked to room in flex with this RN with no issues, states the pain in the left side of his neck began around 1400 yesterday. In bed with no distress at this time. Bed locked and low, call light in reach.

## 2019-04-17 NOTE — ED Triage Notes (Addendum)
Patient ambulatory to triage with steady gait, without difficulty, appears uncomfortable, leaning upper body & neck to left side, mask in place; pt reports since yesterday having left sided neck pain radiating down left arm; denies any known injury, denies hx of same, denies any accomp symptoms

## 2019-04-17 NOTE — Discharge Instructions (Signed)
Dr. Lacinda Axon is the neurosurgeon on call and he should call make an appointment to be evaluated in his office.  Let them know that you were seen in the emergency department so that they can look at your CT scan.  Increase the methocarbamol that you already have to 1 or 2 every 6 hours as needed for muscle spasms.  A prescription for prednisone and Percocet was sent to your pharmacy.  Also be aware that the Percocet that was sent to your pharmacy is stronger than the 5 mg which may also increase your drowsiness.  Do not take the muscle relaxant and pain medication if you plan on driving or operating machinery.  You may also use ice or heat to your neck as needed for discomfort.  Avoid sudden movement or looking at a computer with your neck angle down for long periods of time as this may increase your neck pain.
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
## 2019-04-26 ENCOUNTER — Other Ambulatory Visit: Payer: Self-pay | Admitting: Neurosurgery

## 2019-04-26 DIAGNOSIS — M5412 Radiculopathy, cervical region: Secondary | ICD-10-CM

## 2019-05-02 ENCOUNTER — Other Ambulatory Visit: Payer: Self-pay

## 2019-05-02 ENCOUNTER — Ambulatory Visit
Admission: RE | Admit: 2019-05-02 | Discharge: 2019-05-02 | Disposition: A | Discharge status: Home / Self Care | Payer: BC Managed Care – PPO | Source: Ambulatory Visit | Attending: Neurosurgery | Admitting: Neurosurgery

## 2019-05-02 DIAGNOSIS — M5412 Radiculopathy, cervical region: Secondary | ICD-10-CM

## 2019-05-02 MED ORDER — IOPAMIDOL (ISOVUE-M 300) INJECTION 61%
1.0000 mL | Freq: Once | INTRAMUSCULAR | Status: AC | PRN
  Administered 2019-05-02: 1 mL via EPIDURAL

## 2019-05-02 MED ORDER — TRIAMCINOLONE ACETONIDE 40 MG/ML IJ SUSP (RADIOLOGY)
60.0000 mg | Freq: Once | INTRAMUSCULAR | Status: AC
  Administered 2019-05-02: 60 mg via EPIDURAL

## 2019-05-02 NOTE — Discharge Instructions (Signed)

## 2019-05-19 ENCOUNTER — Other Ambulatory Visit: Payer: Self-pay | Admitting: Internal Medicine

## 2019-05-22 ENCOUNTER — Other Ambulatory Visit: Payer: Self-pay

## 2019-05-22 DIAGNOSIS — J309 Allergic rhinitis, unspecified: Secondary | ICD-10-CM

## 2019-05-22 MED ORDER — MOMETASONE FUROATE 50 MCG/ACT NA SUSP: 2.0000 | Freq: Every day | NASAL | 2 refills | Status: DC

## 2019-07-18 ENCOUNTER — Other Ambulatory Visit: Payer: Self-pay | Admitting: Neurosurgery

## 2019-07-18 DIAGNOSIS — M5412 Radiculopathy, cervical region: Secondary | ICD-10-CM

## 2019-07-23 ENCOUNTER — Other Ambulatory Visit: Payer: Self-pay

## 2019-07-23 ENCOUNTER — Ambulatory Visit
Admission: RE | Admit: 2019-07-23 | Discharge: 2019-07-23 | Disposition: A | Discharge status: Home / Self Care | Payer: BC Managed Care – PPO | Source: Ambulatory Visit | Attending: Neurosurgery | Admitting: Neurosurgery

## 2019-07-23 DIAGNOSIS — M5412 Radiculopathy, cervical region: Secondary | ICD-10-CM

## 2019-07-23 MED ORDER — TRIAMCINOLONE ACETONIDE 40 MG/ML IJ SUSP (RADIOLOGY)
60.0000 mg | Freq: Once | INTRAMUSCULAR | Status: AC
  Administered 2019-07-23: 60 mg via EPIDURAL

## 2019-07-23 MED ORDER — IOPAMIDOL (ISOVUE-M 300) INJECTION 61%
1.0000 mL | Freq: Once | INTRAMUSCULAR | Status: AC
  Administered 2019-07-23: 1 mL via EPIDURAL

## 2019-08-14 ENCOUNTER — Other Ambulatory Visit: Payer: Self-pay | Admitting: Neurosurgery

## 2019-08-15 ENCOUNTER — Other Ambulatory Visit: Payer: Self-pay

## 2019-08-15 ENCOUNTER — Encounter
Admission: RE | Admit: 2019-08-15 | Discharge: 2019-08-15 | Disposition: A | Discharge status: Home / Self Care | Payer: BC Managed Care – PPO | Source: Ambulatory Visit | Attending: Neurosurgery | Admitting: Neurosurgery

## 2019-08-15 DIAGNOSIS — Z01812 Encounter for preprocedural laboratory examination: Secondary | ICD-10-CM | POA: Insufficient documentation

## 2019-08-15 NOTE — Patient Instructions (Signed)
Your procedure is scheduled on: Monday Aug 20, 2019 Report to [facility]. To find out your arrival time please call [phone] between 1PM - 3PM on Friday Aug 17, 2019.  Remember: Instructions that are not followed completely may result in serious medical risk,  up to and including death, or upon the discretion of your surgeon and anesthesiologist your  surgery may need to be rescheduled.     _X__ 1. Do not eat food after midnight the night before your procedure.                 No gum chewing or hard candies. You may drink clear liquids up to 2 hours                 before you are scheduled to arrive for your surgery- DO not drink clear                 liquids within 2 hours of the start of your surgery.                 Clear Liquids include:  water, apple juice without pulp, clear Gatorade, G2 or                  Gatorade Zero (avoid Red/Purple/Blue), Black Coffee or Tea (Do not add                 anything to coffee or tea).  __X__2.  On the morning of surgery brush your teeth with toothpaste and water, you                may rinse your mouth with mouthwash if you wish.  Do not swallow any toothpaste of mouthwash.     _X__ 3.  No Alcohol for 24 hours before or after surgery.   _X__ 4.  Do Not Smoke or use e-cigarettes For 24 Hours Prior to Your Surgery.                 Do not use any chewable tobacco products for at least 6 hours prior to                 Surgery.  _X__  5.  Do not use any recreational drugs (marijuana, cocaine, heroin, ecstacy, MDMA or other)                For at least one week prior to your surgery.  Combination of these drugs with anesthesia                May have life threatening results.  __X__ 6.  Notify your doctor if there is any change in your medical condition      (cold, fever, infections).     Do not wear jewelry, make-up, hairpins, clips or nail polish. Do not wear lotions, powders, or perfumes. You may  wear deodorant. Do not shave 48 hours prior to surgery. Men may shave face and neck. Do not bring valuables to the hospital.    [facility] is not responsible for any belongings or valuables.  Contacts, dentures or bridgework may not be worn into surgery. Leave your suitcase in the car. After surgery it may be brought to your room. For patients admitted to the hospital, discharge time is determined by your treatment team.   Patients discharged the day of surgery will not be allowed to drive home.   Make arrangements for someone to be with you  for the first 24 hours of your Same Day Discharge.   __X__ Take these medicines the morning of surgery with A SIP OF WATER:    1.  levothyroxine (SYNTHROID, LEVOTHROID) 112   2. gabapentin (NEURONTIN) 300   3. oxyCODONE (OXY IR/ROXICODONE) 5 MG  4. oxybutynin  __X__ Use CHG Soap as directed  __X__ Use inhalers on the day of surgery  budesonide-formoterol (SYMBICORT) 160-4.5 MCG/ACT inhaler  PROAIR HFA 108 (90 Base) MCG/ACT inhaler  __X__ Stop Anti-inflammatories such as Ibuprofen, Aleve, naproxen, aspirin and or BC powders.    __X__ Stop supplements until after surgery.    __X__ Do not start any herbal supplements before your surgery.  __X__ Bring C-Pap to the hospital.

## 2019-08-16 ENCOUNTER — Other Ambulatory Visit: Payer: BC Managed Care – PPO

## 2019-08-16 ENCOUNTER — Other Ambulatory Visit
Admission: RE | Admit: 2019-08-16 | Discharge: 2019-08-16 | Disposition: A | Discharge status: Home / Self Care | Payer: BC Managed Care – PPO | Source: Ambulatory Visit | Attending: Neurosurgery | Admitting: Neurosurgery

## 2019-08-16 DIAGNOSIS — Z01812 Encounter for preprocedural laboratory examination: Secondary | ICD-10-CM | POA: Diagnosis present

## 2019-08-16 DIAGNOSIS — Z20822 Contact with and (suspected) exposure to covid-19: Secondary | ICD-10-CM | POA: Diagnosis not present

## 2019-08-16 LAB — SURGICAL PCR SCREEN
MRSA, PCR: NEGATIVE
Staphylococcus aureus: NEGATIVE

## 2019-08-16 LAB — SARS CORONAVIRUS 2 (TAT 6-24 HRS): SARS Coronavirus 2: NEGATIVE

## 2019-08-16 LAB — PROTIME-INR
INR: 1 (ref 0.8–1.2)
Prothrombin Time: 12.4 seconds (ref 11.4–15.2)

## 2019-08-16 LAB — APTT: aPTT: 25 seconds (ref 24–36)

## 2019-08-19 MED ORDER — CIPROFLOXACIN IN D5W 400 MG/200ML IV SOLN
400.0000 mg | Freq: Once | INTRAVENOUS | Status: AC
  Administered 2019-08-20: 400 mg via INTRAVENOUS

## 2019-08-20 ENCOUNTER — Ambulatory Visit: Payer: BC Managed Care – PPO

## 2019-08-20 ENCOUNTER — Encounter: Payer: Self-pay | Admitting: Neurosurgery

## 2019-08-20 ENCOUNTER — Observation Stay
Admission: RE | Admit: 2019-08-20 | Discharge: 2019-08-21 | Disposition: A | Discharge status: Home / Self Care | Payer: BC Managed Care – PPO | Attending: Neurosurgery | Admitting: Neurosurgery

## 2019-08-20 ENCOUNTER — Observation Stay: Payer: BC Managed Care – PPO

## 2019-08-20 ENCOUNTER — Ambulatory Visit: Payer: BC Managed Care – PPO | Admitting: Anesthesiology

## 2019-08-20 ENCOUNTER — Encounter
Admission: RE | Disposition: A | Discharge status: Home / Self Care | Payer: Self-pay | Source: Home / Self Care | Attending: Neurosurgery

## 2019-08-20 ENCOUNTER — Other Ambulatory Visit: Payer: Self-pay

## 2019-08-20 DIAGNOSIS — Z79899 Other long term (current) drug therapy: Secondary | ICD-10-CM | POA: Diagnosis not present

## 2019-08-20 DIAGNOSIS — Z981 Arthrodesis status: Secondary | ICD-10-CM

## 2019-08-20 DIAGNOSIS — E039 Hypothyroidism, unspecified: Secondary | ICD-10-CM | POA: Diagnosis not present

## 2019-08-20 DIAGNOSIS — Z880 Allergy status to penicillin: Secondary | ICD-10-CM | POA: Insufficient documentation

## 2019-08-20 DIAGNOSIS — M5010 Cervical disc disorder with radiculopathy, unspecified cervical region: Secondary | ICD-10-CM | POA: Diagnosis not present

## 2019-08-20 DIAGNOSIS — M5412 Radiculopathy, cervical region: Secondary | ICD-10-CM | POA: Diagnosis present

## 2019-08-20 DIAGNOSIS — E785 Hyperlipidemia, unspecified: Secondary | ICD-10-CM | POA: Insufficient documentation

## 2019-08-20 DIAGNOSIS — J449 Chronic obstructive pulmonary disease, unspecified: Secondary | ICD-10-CM | POA: Insufficient documentation

## 2019-08-20 DIAGNOSIS — Z87891 Personal history of nicotine dependence: Secondary | ICD-10-CM | POA: Diagnosis not present

## 2019-08-20 DIAGNOSIS — Z419 Encounter for procedure for purposes other than remedying health state, unspecified: Secondary | ICD-10-CM

## 2019-08-20 DIAGNOSIS — Z79890 Hormone replacement therapy (postmenopausal): Secondary | ICD-10-CM | POA: Insufficient documentation

## 2019-08-20 HISTORY — PX: ANTERIOR CERVICAL DECOMP/DISCECTOMY FUSION: SHX1161

## 2019-08-20 LAB — TYPE AND SCREEN
ABO/RH(D): A POS
Antibody Screen: NEGATIVE

## 2019-08-20 LAB — ABO/RH: ABO/RH(D): A POS

## 2019-08-20 SURGERY — ANTERIOR CERVICAL DECOMPRESSION/DISCECTOMY FUSION 2 LEVELS
Anesthesia: General

## 2019-08-20 MED ORDER — TIOTROPIUM BROMIDE MONOHYDRATE 18 MCG IN CAPS
1.0000 | ORAL_CAPSULE | Freq: Two times a day (BID) | RESPIRATORY_TRACT | Status: DC
  Administered 2019-08-20 – 2019-08-21 (×2): 18 ug via RESPIRATORY_TRACT
  Filled 2019-08-20: qty 5

## 2019-08-20 MED ORDER — PHENOL 1.4 % MT LIQD
1.0000 | OROMUCOSAL | Status: DC | PRN
  Filled 2019-08-20: qty 177

## 2019-08-20 MED ORDER — PROMETHAZINE HCL 25 MG/ML IJ SOLN: 6.2500 mg | INTRAMUSCULAR | Status: DC | PRN

## 2019-08-20 MED ORDER — MIDAZOLAM HCL 2 MG/2ML IJ SOLN
INTRAMUSCULAR | Status: DC | PRN
  Administered 2019-08-20: 2 mg via INTRAVENOUS

## 2019-08-20 MED ORDER — FLEET ENEMA 7-19 GM/118ML RE ENEM: 1.0000 | ENEMA | Freq: Once | RECTAL | Status: DC | PRN

## 2019-08-20 MED ORDER — ONDANSETRON HCL 4 MG/2ML IJ SOLN
INTRAMUSCULAR | Status: AC
  Filled 2019-08-20: qty 2

## 2019-08-20 MED ORDER — PHENYLEPHRINE HCL (PRESSORS) 10 MG/ML IV SOLN
INTRAVENOUS | Status: DC | PRN
  Administered 2019-08-20: 100 ug via INTRAVENOUS
  Administered 2019-08-20: 50 ug via INTRAVENOUS
  Administered 2019-08-20 (×2): 100 ug via INTRAVENOUS

## 2019-08-20 MED ORDER — LACTATED RINGERS IV SOLN: INTRAVENOUS | Status: DC | PRN

## 2019-08-20 MED ORDER — ACETAMINOPHEN 325 MG PO TABS: 650.0000 mg | ORAL_TABLET | ORAL | Status: DC | PRN

## 2019-08-20 MED ORDER — HYDROMORPHONE HCL 1 MG/ML IJ SOLN
0.5000 mg | INTRAMUSCULAR | Status: AC | PRN
  Administered 2019-08-20 (×2): 0.5 mg via INTRAVENOUS

## 2019-08-20 MED ORDER — SODIUM CHLORIDE 0.9% FLUSH
3.0000 mL | Freq: Two times a day (BID) | INTRAVENOUS | Status: DC
  Administered 2019-08-20 – 2019-08-21 (×2): 3 mL via INTRAVENOUS

## 2019-08-20 MED ORDER — PROPOFOL 500 MG/50ML IV EMUL
INTRAVENOUS | Status: AC
  Filled 2019-08-20: qty 50

## 2019-08-20 MED ORDER — LEVOTHYROXINE SODIUM 112 MCG PO TABS
112.0000 ug | ORAL_TABLET | Freq: Every day | ORAL | Status: DC
  Administered 2019-08-21: 112 ug via ORAL
  Filled 2019-08-20: qty 1

## 2019-08-20 MED ORDER — HYDROMORPHONE HCL 1 MG/ML IJ SOLN
INTRAMUSCULAR | Status: AC
Start: 2019-08-20 — End: 2019-08-21
  Filled 2019-08-20: qty 1

## 2019-08-20 MED ORDER — DEXMEDETOMIDINE HCL 200 MCG/2ML IV SOLN
INTRAVENOUS | Status: DC | PRN
  Administered 2019-08-20: 4 ug via INTRAVENOUS
  Administered 2019-08-20: 8 ug via INTRAVENOUS

## 2019-08-20 MED ORDER — OXYCODONE HCL 5 MG PO TABS: 5.0000 mg | ORAL_TABLET | ORAL | Status: DC | PRN

## 2019-08-20 MED ORDER — POLYETHYLENE GLYCOL 3350 17 G PO PACK: 17.0000 g | PACK | Freq: Every day | ORAL | Status: DC | PRN

## 2019-08-20 MED ORDER — OXYCODONE HCL 5 MG/5ML PO SOLN: 5.0000 mg | Freq: Once | ORAL | Status: AC | PRN

## 2019-08-20 MED ORDER — ALBUTEROL SULFATE (2.5 MG/3ML) 0.083% IN NEBU: 2.5000 mg | INHALATION_SOLUTION | Freq: Four times a day (QID) | RESPIRATORY_TRACT | Status: DC | PRN

## 2019-08-20 MED ORDER — FENTANYL CITRATE (PF) 100 MCG/2ML IJ SOLN
25.0000 ug | INTRAMUSCULAR | Status: DC | PRN
  Administered 2019-08-20 (×2): 50 ug via INTRAVENOUS

## 2019-08-20 MED ORDER — PROPOFOL 500 MG/50ML IV EMUL
INTRAVENOUS | Status: DC | PRN
  Administered 2019-08-20: 150 ug/kg/min via INTRAVENOUS

## 2019-08-20 MED ORDER — FENTANYL CITRATE (PF) 100 MCG/2ML IJ SOLN
INTRAMUSCULAR | Status: AC
Start: 2019-08-20 — End: 2019-08-21
  Filled 2019-08-20: qty 2

## 2019-08-20 MED ORDER — HYDROMORPHONE HCL 1 MG/ML IJ SOLN
0.5000 mg | INTRAMUSCULAR | Status: AC | PRN
  Administered 2019-08-20 (×4): 0.5 mg via INTRAVENOUS

## 2019-08-20 MED ORDER — OXYCODONE HCL 5 MG PO TABS
ORAL_TABLET | ORAL | Status: AC
Start: 2019-08-20 — End: 2019-08-21
  Filled 2019-08-20: qty 1

## 2019-08-20 MED ORDER — ACETAMINOPHEN 10 MG/ML IV SOLN
INTRAVENOUS | Status: AC
  Filled 2019-08-20: qty 100

## 2019-08-20 MED ORDER — LIDOCAINE HCL (CARDIAC) PF 100 MG/5ML IV SOSY
PREFILLED_SYRINGE | INTRAVENOUS | Status: DC | PRN
  Administered 2019-08-20: 100 mg via INTRAVENOUS

## 2019-08-20 MED ORDER — HYDROMORPHONE HCL 1 MG/ML IJ SOLN
INTRAMUSCULAR | Status: AC
  Filled 2019-08-20: qty 1

## 2019-08-20 MED ORDER — DEXAMETHASONE SODIUM PHOSPHATE 10 MG/ML IJ SOLN
INTRAMUSCULAR | Status: DC | PRN
  Administered 2019-08-20: 10 mg via INTRAVENOUS

## 2019-08-20 MED ORDER — PROPOFOL 10 MG/ML IV BOLUS
INTRAVENOUS | Status: AC
  Filled 2019-08-20: qty 20

## 2019-08-20 MED ORDER — SUCCINYLCHOLINE CHLORIDE 200 MG/10ML IV SOSY
PREFILLED_SYRINGE | INTRAVENOUS | Status: AC
  Filled 2019-08-20: qty 10

## 2019-08-20 MED ORDER — SODIUM CHLORIDE 0.9 % IV SOLN
INTRAVENOUS | Status: DC | PRN
  Administered 2019-08-20: .05 ug/kg/min via INTRAVENOUS

## 2019-08-20 MED ORDER — CIPROFLOXACIN IN D5W 400 MG/200ML IV SOLN
INTRAVENOUS | Status: AC
  Filled 2019-08-20: qty 200

## 2019-08-20 MED ORDER — ONDANSETRON HCL 4 MG/2ML IJ SOLN: 4.0000 mg | Freq: Four times a day (QID) | INTRAMUSCULAR | Status: DC | PRN

## 2019-08-20 MED ORDER — METHOCARBAMOL 1000 MG/10ML IJ SOLN
500.0000 mg | Freq: Four times a day (QID) | INTRAVENOUS | Status: DC | PRN
  Administered 2019-08-20: 500 mg via INTRAVENOUS
  Filled 2019-08-20 (×2): qty 5

## 2019-08-20 MED ORDER — PROPOFOL 10 MG/ML IV BOLUS
INTRAVENOUS | Status: DC | PRN
  Administered 2019-08-20: 150 mg via INTRAVENOUS

## 2019-08-20 MED ORDER — SODIUM CHLORIDE 0.9 % IV SOLN
250.0000 mL | INTRAVENOUS | Status: DC
  Administered 2019-08-20: 250 mL via INTRAVENOUS

## 2019-08-20 MED ORDER — DEXAMETHASONE SODIUM PHOSPHATE 10 MG/ML IJ SOLN
INTRAMUSCULAR | Status: AC
  Filled 2019-08-20: qty 1

## 2019-08-20 MED ORDER — ONDANSETRON HCL 4 MG PO TABS: 4.0000 mg | ORAL_TABLET | Freq: Four times a day (QID) | ORAL | Status: DC | PRN

## 2019-08-20 MED ORDER — ONDANSETRON HCL 4 MG/2ML IJ SOLN
INTRAMUSCULAR | Status: DC | PRN
  Administered 2019-08-20: 4 mg via INTRAVENOUS

## 2019-08-20 MED ORDER — FENTANYL CITRATE (PF) 100 MCG/2ML IJ SOLN
INTRAMUSCULAR | Status: AC
  Filled 2019-08-20: qty 2

## 2019-08-20 MED ORDER — SODIUM CHLORIDE 0.9 % IV SOLN: 250.0000 mL | INTRAVENOUS | Status: DC

## 2019-08-20 MED ORDER — THROMBIN 5000 UNITS EX SOLR
CUTANEOUS | Status: DC | PRN
  Administered 2019-08-20: 5000 [IU] via TOPICAL

## 2019-08-20 MED ORDER — GABAPENTIN 300 MG PO CAPS
300.0000 mg | ORAL_CAPSULE | Freq: Three times a day (TID) | ORAL | Status: DC
  Administered 2019-08-20 – 2019-08-21 (×2): 300 mg via ORAL
  Filled 2019-08-20 (×2): qty 1

## 2019-08-20 MED ORDER — SODIUM CHLORIDE (PF) 0.9 % IJ SOLN
INTRAMUSCULAR | Status: AC
  Filled 2019-08-20: qty 20

## 2019-08-20 MED ORDER — FAMOTIDINE 20 MG PO TABS
ORAL_TABLET | ORAL | Status: AC
  Administered 2019-08-20: 20 mg via ORAL
  Filled 2019-08-20: qty 1

## 2019-08-20 MED ORDER — REMIFENTANIL HCL 1 MG IV SOLR
INTRAVENOUS | Status: AC
  Filled 2019-08-20: qty 1000

## 2019-08-20 MED ORDER — SODIUM CHLORIDE FLUSH 0.9 % IV SOLN
INTRAVENOUS | Status: AC
  Filled 2019-08-20: qty 10

## 2019-08-20 MED ORDER — SENNA 8.6 MG PO TABS
1.0000 | ORAL_TABLET | Freq: Two times a day (BID) | ORAL | Status: DC
  Administered 2019-08-20 – 2019-08-21 (×2): 8.6 mg via ORAL
  Filled 2019-08-20 (×2): qty 1

## 2019-08-20 MED ORDER — DEXMEDETOMIDINE HCL IN NACL 80 MCG/20ML IV SOLN
INTRAVENOUS | Status: AC
  Filled 2019-08-20: qty 20

## 2019-08-20 MED ORDER — OXYCODONE HCL 5 MG PO TABS
10.0000 mg | ORAL_TABLET | ORAL | Status: DC | PRN
  Administered 2019-08-20: 10 mg via ORAL
  Filled 2019-08-20: qty 2

## 2019-08-20 MED ORDER — ACETAMINOPHEN 10 MG/ML IV SOLN
INTRAVENOUS | Status: DC | PRN
  Administered 2019-08-20: 1000 mg via INTRAVENOUS

## 2019-08-20 MED ORDER — SODIUM CHLORIDE 0.9% FLUSH: 3.0000 mL | INTRAVENOUS | Status: DC | PRN

## 2019-08-20 MED ORDER — FENTANYL CITRATE (PF) 100 MCG/2ML IJ SOLN
INTRAMUSCULAR | Status: DC | PRN
  Administered 2019-08-20: 50 ug via INTRAVENOUS

## 2019-08-20 MED ORDER — BISACODYL 10 MG RE SUPP: 10.0000 mg | Freq: Every day | RECTAL | Status: DC | PRN

## 2019-08-20 MED ORDER — LIDOCAINE-EPINEPHRINE 1 %-1:100000 IJ SOLN
INTRAMUSCULAR | Status: DC | PRN
  Administered 2019-08-20: 4 mL

## 2019-08-20 MED ORDER — ACETAMINOPHEN 650 MG RE SUPP: 650.0000 mg | RECTAL | Status: DC | PRN

## 2019-08-20 MED ORDER — SUCCINYLCHOLINE CHLORIDE 20 MG/ML IJ SOLN
INTRAMUSCULAR | Status: DC | PRN
  Administered 2019-08-20: 100 mg via INTRAVENOUS

## 2019-08-20 MED ORDER — HYDROMORPHONE HCL 1 MG/ML IJ SOLN: 0.5000 mg | INTRAMUSCULAR | Status: DC | PRN

## 2019-08-20 MED ORDER — LACTATED RINGERS IV SOLN: INTRAVENOUS | Status: DC

## 2019-08-20 MED ORDER — FAMOTIDINE 20 MG PO TABS: 20.0000 mg | ORAL_TABLET | Freq: Once | ORAL | Status: AC

## 2019-08-20 MED ORDER — ROSUVASTATIN CALCIUM 10 MG PO TABS
10.0000 mg | ORAL_TABLET | Freq: Every day | ORAL | Status: DC
  Administered 2019-08-20 – 2019-08-21 (×2): 10 mg via ORAL
  Filled 2019-08-20 (×2): qty 1

## 2019-08-20 MED ORDER — MENTHOL 3 MG MT LOZG
1.0000 | LOZENGE | OROMUCOSAL | Status: DC | PRN
  Filled 2019-08-20: qty 9

## 2019-08-20 MED ORDER — MEPERIDINE HCL 50 MG/ML IJ SOLN: 6.2500 mg | INTRAMUSCULAR | Status: DC | PRN

## 2019-08-20 MED ORDER — PHENYLEPHRINE HCL (PRESSORS) 10 MG/ML IV SOLN
INTRAVENOUS | Status: AC
  Filled 2019-08-20: qty 1

## 2019-08-20 MED ORDER — OXYCODONE HCL 5 MG PO TABS
5.0000 mg | ORAL_TABLET | Freq: Once | ORAL | Status: AC | PRN
  Administered 2019-08-20: 5 mg via ORAL

## 2019-08-20 MED ORDER — METHOCARBAMOL 500 MG PO TABS: 500.0000 mg | ORAL_TABLET | Freq: Four times a day (QID) | ORAL | Status: DC | PRN

## 2019-08-20 MED ORDER — OXYBUTYNIN CHLORIDE ER 5 MG PO TB24
5.0000 mg | ORAL_TABLET | Freq: Every day | ORAL | Status: DC
  Administered 2019-08-21: 5 mg via ORAL
  Filled 2019-08-20: qty 1

## 2019-08-20 MED ORDER — MIDAZOLAM HCL 2 MG/2ML IJ SOLN
INTRAMUSCULAR | Status: AC
  Filled 2019-08-20: qty 2

## 2019-08-20 SURGICAL SUPPLY — 63 items
BIT DRILL 13 (BIT) ×2 IMPLANT
BLADE BOVIE TIP EXT 4 (BLADE) ×2 IMPLANT
BLADE SURG 15 STRL LF DISP TIS (BLADE) ×1 IMPLANT
BLADE SURG 15 STRL SS (BLADE) ×1
BONE WEDGE CONERSTONE 5X14X11 (Bone Implant) ×2 IMPLANT
BONE WEDGE CONERSTONE 7X14X11 (Bone Implant) ×2 IMPLANT
BUR DIAMOND COARSE 4.0 RND (BURR) IMPLANT
BUR NEURO DRILL SOFT 3.0X3.8M (BURR) ×2 IMPLANT
CANISTER SUCT 1200ML W/VALVE (MISCELLANEOUS) ×2 IMPLANT
CHLORAPREP W/TINT 26 (MISCELLANEOUS) ×2 IMPLANT
COLLAR CERV MED MED DENS 3 (SOFTGOODS) IMPLANT
COLLAR CERV SM MED DENS 3 (SOFTGOODS) IMPLANT
COLLAR CERV XL MED DENS 3 (SOFTGOODS) IMPLANT
COUNTER NEEDLE 20/40 LG (NEEDLE) ×2 IMPLANT
COVER LIGHT HANDLE STERIS (MISCELLANEOUS) ×4 IMPLANT
COVER WAND RF STERILE (DRAPES) ×2 IMPLANT
CRADLE LAMINECT ARM (MISCELLANEOUS) ×2 IMPLANT
CUP MEDICINE 2OZ PLAST GRAD ST (MISCELLANEOUS) ×4 IMPLANT
DERMABOND ADVANCED (GAUZE/BANDAGES/DRESSINGS) ×1
DERMABOND ADVANCED .7 DNX12 (GAUZE/BANDAGES/DRESSINGS) ×1 IMPLANT
DRAPE C-ARM 42X72 X-RAY (DRAPES) ×4 IMPLANT
DRAPE MICROSCOPE SPINE 48X150 (DRAPES) ×4 IMPLANT
DRAPE SURG 17X11 SM STRL (DRAPES) ×4 IMPLANT
DRAPE THYROID T SHEET (DRAPES) ×2 IMPLANT
ELECT CAUTERY BLADE TIP 2.5 (TIP) ×2
ELECT EZSTD 165MM 6.5IN (MISCELLANEOUS) ×2
ELECTRODE CAUTERY BLDE TIP 2.5 (TIP) ×1 IMPLANT
ELECTRODE EZSTD 165MM 6.5IN (MISCELLANEOUS) ×1 IMPLANT
FEE INTRAOP MONITOR IMPULS NCS (MISCELLANEOUS) ×1 IMPLANT
GAUZE SPONGE 4X4 12PLY STRL (GAUZE/BANDAGES/DRESSINGS) ×2 IMPLANT
GLOVE INDICATOR 7.0 STRL GRN (GLOVE) ×2 IMPLANT
GLOVE INDICATOR 8.0 STRL GRN (GLOVE) ×2 IMPLANT
GLOVE SURG SYN 7.0 (GLOVE) ×4 IMPLANT
GLOVE SURG SYN 8.0 (GLOVE) ×4 IMPLANT
GOWN STRL REUS W/ TWL XL LVL3 (GOWN DISPOSABLE) ×2 IMPLANT
GOWN STRL REUS W/TWL MED LVL3 (GOWN DISPOSABLE) ×2 IMPLANT
GOWN STRL REUS W/TWL XL LVL3 (GOWN DISPOSABLE) ×2
GRADUATE 1200CC STRL 31836 (MISCELLANEOUS) ×2 IMPLANT
INTRAOP MONITOR FEE IMPULS NCS (MISCELLANEOUS) ×1
INTRAOP MONITOR FEE IMPULSE (MISCELLANEOUS) ×1
IV CATH ANGIO 12GX3 LT BLUE (NEEDLE) ×2 IMPLANT
KIT TURNOVER KIT A (KITS) ×2 IMPLANT
MARKER SKIN DUAL TIP RULER LAB (MISCELLANEOUS) ×4 IMPLANT
NEEDLE HYPO 22GX1.5 SAFETY (NEEDLE) ×2 IMPLANT
NEEDLE SPNL 22GX3.5 QUINCKE BK (NEEDLE) ×2 IMPLANT
NS IRRIG 1000ML POUR BTL (IV SOLUTION) ×2 IMPLANT
PACK LAMINECTOMY NEURO (CUSTOM PROCEDURE TRAY) ×2 IMPLANT
PIN CASPAR 14 (PIN) ×1 IMPLANT
PIN CASPAR 14MM (PIN) ×2
PLATE ZEVO 2LVL 37MM (Plate) ×2 IMPLANT
PUTTY DBF 1CC CORTICAL FIBERS (Putty) ×2 IMPLANT
SCREW 3.5 SELFDRILL 15MM VARI (Screw) ×12 IMPLANT
SPOGE SURGIFLO 8M (HEMOSTASIS) ×1
SPONGE KITTNER 5P (MISCELLANEOUS) ×2 IMPLANT
SPONGE SURGIFLO 8M (HEMOSTASIS) ×1 IMPLANT
SUT POLYSORB 2-0 5X18 GS-10 (SUTURE) ×4 IMPLANT
SUT VICRYL 2-0 SH 8X27 (SUTURE) IMPLANT
SUT VICRYL 3-0 CR8 SH (SUTURE) ×4 IMPLANT
SYR 30ML LL (SYRINGE) ×2 IMPLANT
TAPE CLOTH 3X10 WHT NS LF (GAUZE/BANDAGES/DRESSINGS) ×2 IMPLANT
TOWEL OR 17X26 4PK STRL BLUE (TOWEL DISPOSABLE) ×4 IMPLANT
TRAY FOLEY MTR SLVR 16FR STAT (SET/KITS/TRAYS/PACK) ×2 IMPLANT
TUBING CONNECTING 10 (TUBING) ×2 IMPLANT

## 2019-08-20 NOTE — Plan of Care (Signed)
Pt progressing towards goal, deep breathing

## 2019-08-20 NOTE — Op Note (Signed)
Operative Note 08/20/2019  PRE-OP DIAGNOSIS:  Cervical Radiculopathy [M54.12]  * Herniated nucleus pulposus, cervical [M50.20]   POST-OP DIAGNOSIS: Post-Op Diagnosis Codes:  Cervical Radiculopathy [M54.12]  * Herniated nucleus pulposus, cervical [M50.20]   Procedure(s):  1. ARTHRODESIS, ANTERIOR INTERBODY, INCLUDING DISC SPACE PREPARATION, DISCECTOMY, OSTEOPHYTECTOMY AND DECOMPRESSION OF SPINAL CORD AND/ORNERVE ROOTS; CERVICAL BELOW C2-- C5/6 ACDF  2. ARTHRODESIS ANTERIOR INTERBODY W/DISCECTOMY ADDITIONAL LEVEL C4/5  3. ANTERIOR INSTRUMENTATION; 2 TO 3 VERTEBRAL SEGMENTS (LIST IN ADDITION TO PRIMARY PROCEDURE)  4. ALLOGRAFT, STRUCTURAL, FOR SPINE SURGERY ONLY (LIST IN ADDITION TO PRIMARY PROCEDURE)   SURGEON: Surgeon(s) and Role:  * Malen Gauze, MD - Primary  Christine Rutherford Limerick, PA, Asisstant   ANESTHESIA: General   OPERATIVE FINDINGS: Stenosis at C4/5 and C5/6   OPERATIVE REPORT:  Indications  David [name] presented to our clinic on 5/11 with ongoing pain in left arm. He had failed conservative management of prescription medications, oral steroids, epidural steroids, and physical therapy. He had a MRI that showed stenosis on left at C5/6 and C4/5. Given this, we recommended an anterior cervical decompression and fusion to relieve the pressure on the spinal cord and nerve roots and allow for healing. The risks of hematoma, infection, poor bone healing and failure of fusion, cord injury, weakness, numbness, neck pain, stroke, and death were discussed in detail. All questions were answered and the patient elected to proceed with the surgery.   Procedure  After obtaining informed consent, the patient was taken to the Operating Room where general anesthesia was induced and the patient intubated. Vascular access was obtained. Decadron and antibiotics were administered. Neuromonitoing electrodes were placed for MEP and SSEP. The head was slightly extended and imaging used to identify a  skin crease overlying the C5 vertebral body. Appropriate padding was performed.   The patient was prepped and draped in the usual sterile fashion and a timeout was performed per protocol. Local anesthesia was instilled with epinephrine along the planned incision site. A transverse cervical incision was performed on the left in a skin crease. The incision was carried to the level of the platysma and then cautery was used to incise the muscle. Blunt dissection was used to expand the plane and the dissection was carried deep medial to the SCM and carotid sheath being careful to identify the trachea and esophagus medially. The prevertebral fascia was identified and this was bluntly dissected to expose the disc spaces. A needle was placed in the disc space and x-ray confirmed the C4/5 disc level.   Next, cautery was used to undermine the longus colli muscles bilaterally and identify the C4/5 disc space. Caspar pins were placed at C4 and C6 and a retractor system placed under the muscles to complete the exposure. Next, a combination of curettes and Kerrison rongeurs were used to remove the anterior osteophyte at C4/5 and then the disc material. A 35mm matchstick was used to shave the endplates of the adjacent bodies. A trial spacer was used to size the graft and then hemostasis obtained.   The microscope was brought into the field for the remainder of the surgery. The matchsitick drill bit was used to remove the osteophyte/disc complex deep to the level of the PLL at C4/5. There was significant disc protrusion at this level that was removed with rongeurs and significant osteophyte on the left that was removed with the drill. The PLL was entered with a hook and then the PLL was removed along with remaining disc material to decompress centrally and  then out into bilateral neuro foramen. A blunt probe was used to confirm no residual stenosis laterally and a curette used to ensure no posterior osteophyte remained.  Floseal was used for hemostasis. Allograft was placed, 5mm in height and placed slightly recessed to the anterior edge of vertebral body to promote arthrodesis.   The retractors were moved to the C5/6 disc space. Next, a combination of curettes and Kerrison rongeurs were used to remove the anterior osteophyte at C5/6 and then the disc material. There was minimal disc due to collapsed space. A 68mm matchstick was used to widen the disc space to a height of 5 mm.  A trial spacer was used to size the graft and then hemostasis obtained. There was a significant osteophyte at this level also that was removed.  The PLL was entered with a hook and then the PLL was removed along with remaining disc material to decompress centrally and then out into bilateral neuro foramen.  A blunt probe was used to confirm no residual stenosis laterally and a curette used to ensure no posterior osteophyte remained. Floseal was used for hemostasis. Allograft was placed, 18mm in height and placed slightly recessed to the anterior edge of vertebral body to promote arthrodesis.   The caspar pins were removed and bone wax placed. The remainder of the osteophytes were removed to allow for plating. Next, a 74mm two level plate was found to be the adequate size and was placed in the midline and secured with two 15 mm screws at each bone level. Xray was obtained confirming good graft placement and adequate depth of screws. The retractors were removed. The wound was irrigated copiously and hemostasis obtained. The platysma was closed with 2-0 vicryl suture. The dermis was closed with 3-0 Vicryl and Dermabond was placed on the skin.   The patient had general anesthesia reversed and was extubated following the procedure. She awoke following commands with symmetric movement. She was taken to the PACU where she continued recovery and then the ward.   ESTIMATED BLOOD LOSS: 50 cc   SPECIMENS: None   IMPLANT  PUTTY DBF 1CC CORTICAL FIBERS -  ID:[id number]  Inventory Item: PUTTY DBF 1CC CORTICAL FIBERS Serial no.: [id number] Model/Cat no.: [id number]  Implant name: PUTTY DBF 1CC CORTICAL FIBERS - [id number] Laterality: N/A Area: Spine Cervical  Manufacturer: [employer] Date of Manufacture:    Action: Implanted Number Used: 1   Device Identifier:  Device Identifier Type:    BONE WEDGE CONERSTONE [id number] - [id number]  Inventory Item: BONE WEDGE CONERSTONE [id number] Serial no.: [id number] Model/Cat no.[id number]  Implant name: BONE Corena Herter [id number] - [id number] Laterality: N/A Area: Spine Cervical  Manufacturer: [employer] Date of Manufacture:    Action: Implanted Number Used: 1   Device Identifier:  Device Identifier Type:    BONE WEDGE CONERSTONE [id number] - [id number]  Inventory Item: BONE WEDGE CONERSTONE [id number] Serial no.: [id number] Model/Cat no.[id number]  Implant name: BONE Corena Herter [id number] - [id number] Laterality: N/A Area: Spine Cervical  Manufacturer: [employer] Date of Manufacture:    Action: Implanted Number Used: 1   Device Identifier:  Device Identifier Type:    SCREW 3.5 SELFDRILL 15MM VARI [id number]  Inventory Item: SCREW 3.5 SELFDRILL 15MM VARI Serial no.:  Model/Cat no.: [id number]  Implant name: SCREW 3.5 SELFDRILL 15MM VARI - [id number] Laterality: N/A Area: Spine Cervical  Manufacturer: [employer] Date of Manufacture:    Action: Implanted Number Used: 6   Device Identifier:  Device Identifier Type:    PLATE ZEVO 2LVL 624THL - [id number]  Inventory Item: PLATE ZEVO 2LVL 624THL Serial no.:  Model/Cat no.: [id number]  Implant name: Ellen Henri 2LVL 624THL - [id number] Laterality: N/A Area: Spine Cervical  Manufacturer: [employer] Date of Manufacture:    Action: Implanted Number Used: 1   Device Identifier:  Device Identifier Type:        ATTESTATION:  I performed the procedure in its entirety with the assistance of Lonell Face, physician assistant    [name]  [phone]

## 2019-08-20 NOTE — Progress Notes (Signed)
Procedure: ACDF C4-6 Procedure date: 08/20/2019 Diagnosis: Cervical radiculopathy   History: David [name] is s/p C4-6 ACDF POD: Tolerated procedure well. Evaluated in post op recovery, alert, oriented x 4.   Physical Exam: Vitals:   08/20/19 1412 08/20/19 1504  BP: (!) 126/94 126/89  Pulse: 86 86  Resp: 18 18  Temp: 98.3 F (36.8 C) 98.3 F (36.8 C)  SpO2: 94%     General: Alert and oriented, sitting in bed Strength:5/5 throughout  Sensation: intact and symmetric throughout  Skin: Neck incision clean, dry, intact. No evidence of hematoma noted  Data:  No results for input(s): NA, K, CL, CO2, BUN, CREATININE, LABGLOM, GLUCOSE, CALCIUM in the last 168 hours. No results for input(s): AST, ALT, ALKPHOS in the last 168 hours.  Invalid input(s): TBILI   No results for input(s): WBC, HGB, HCT, PLT in the last 168 hours. Recent Labs  Lab 08/16/19 0808  APTT 25  INR 1.0           Assessment/Plan:  David [name] is POD0 s/p C4-6 ACDF. He reports complete resolution of his left arm pain.  He will be admitted overnight for observation and will likely d/c home tomorrow.  - Mobilize, OOB tonight with brace/soft collar in place - Post op cervical spine films pending - Progress to soft diet - I&O catheterized at end of case, due to urinate by late afternoon. I&O cath/bladder scan prn - Pain management as ordered   Lonell Face, NP Department of Neurosurgery

## 2019-08-20 NOTE — Anesthesia Preprocedure Evaluation (Signed)
Anesthesia Evaluation  Patient identified by MRN, date of birth, ID band Patient awake    Reviewed: Allergy & Precautions, NPO status , Patient's Chart, lab work & pertinent test results  History of Anesthesia Complications Negative for: history of anesthetic complications  Airway Mallampati: III  TM Distance: >3 FB Neck ROM: Full    Dental  (+) Poor Dentition   Pulmonary sleep apnea , COPD,  COPD inhaler, former smoker,    breath sounds clear to auscultation- rhonchi (-) wheezing      Cardiovascular Exercise Tolerance: Good (-) hypertension(-) CAD, (-) Past MI, (-) Cardiac Stents and (-) CABG  Rhythm:Regular Rate:Normal - Systolic murmurs and - Diastolic murmurs    Neuro/Psych neg Seizures negative neurological ROS  negative psych ROS   GI/Hepatic negative GI ROS, Neg liver ROS,   Endo/Other  neg diabetesHypothyroidism   Renal/GU negative Renal ROS     Musculoskeletal  (+) Arthritis ,   Abdominal (+) - obese,   Peds  Hematology negative hematology ROS (+)   Anesthesia Other Findings Past Medical History: No date: B12 deficiency No date: Complete tear of right rotator cuff No date: Compression fracture of spine (HCC) No date: COPD (chronic obstructive pulmonary disease) (HCC) No date: DDD (degenerative disc disease), lumbar No date: H/O emphysema No date: Hyperlipidemia No date: Hypothyroidism No date: Lung nodule, multiple     Comment:  3MM. AND 8 MM No date: Nocturnal hypoxia No date: Pneumonia No date: Shortness of breath on exertion No date: Sleep apnea No date: Thrombocytopenia ([place]) No date: Thyroid disease   Reproductive/Obstetrics                             Anesthesia Physical Anesthesia Plan  ASA: III  Anesthesia Plan: General   Post-op Pain Management:    Induction: Intravenous  PONV Risk Score and Plan: 1 and Ondansetron, Dexamethasone and  Midazolam  Airway Management Planned: Oral ETT  Additional Equipment: Arterial line  Intra-op Plan:   Post-operative Plan: Extubation in OR  Informed Consent: I have reviewed the patients History and Physical, chart, labs and discussed the procedure including the risks, benefits and alternatives for the proposed anesthesia with the patient or authorized representative who has indicated his/her understanding and acceptance.     Dental advisory given  Plan Discussed with: CRNA and Anesthesiologist  Anesthesia Plan Comments:         Anesthesia Quick Evaluation

## 2019-08-20 NOTE — Anesthesia Procedure Notes (Signed)
Procedure Name: Intubation Date/Time: 08/20/2019 7:37 AM Performed by: Aline Brochure, CRNA Pre-anesthesia Checklist: Patient identified, Emergency Drugs available, Suction available and Patient being monitored Patient Re-evaluated:Patient Re-evaluated prior to induction Oxygen Delivery Method: Circle system utilized Preoxygenation: Pre-oxygenation with 100% oxygen Induction Type: IV induction Ventilation: Mask ventilation without difficulty Laryngoscope Size: McGraph and 4 Grade View: Grade I Tube type: Oral Tube size: 7.5 mm Number of attempts: 1 Airway Equipment and Method: Stylet and Video-laryngoscopy Placement Confirmation: positive ETCO2,  ETT inserted through vocal cords under direct vision and breath sounds checked- equal and bilateral Secured at: 24 cm Tube secured with: Tape Dental Injury: Teeth and Oropharynx as per pre-operative assessment

## 2019-08-20 NOTE — Anesthesia Procedure Notes (Signed)
Arterial Line Insertion Start/End5/17/2021 7:43 AM, 08/20/2019 7:48 AM Performed by: Emmie Niemann, MD, Aline Brochure, CRNA, CRNA  Patient location: OR. Preanesthetic checklist: patient identified, IV checked, site marked, risks and benefits discussed, surgical consent, monitors and equipment checked, pre-op evaluation, timeout performed and anesthesia consent Lidocaine 1% used for infiltration Right, radial was placed Catheter size: 20 Fr Hand hygiene performed  and maximum sterile barriers used   Attempts: 1 Procedure performed without using ultrasound guided technique. Following insertion, dressing applied. Post procedure assessment: normal and unchanged  Patient tolerated the procedure well with no immediate complications.

## 2019-08-20 NOTE — Anesthesia Postprocedure Evaluation (Signed)
Anesthesia Post Note  Patient: David [name]  Procedure(s) Performed: ANTERIOR CERVICAL DECOMPRESSION/DISCECTOMY FUSION 2 LEVELS C4/5, C5/6 (N/A )  Patient location during evaluation: PACU Anesthesia Type: General Level of consciousness: awake and alert and oriented Pain management: pain level controlled Vital Signs Assessment: post-procedure vital signs reviewed and stable Respiratory status: spontaneous breathing, nonlabored ventilation and respiratory function stable Cardiovascular status: blood pressure returned to baseline and stable Postop Assessment: no signs of nausea or vomiting Anesthetic complications: no     Last Vitals:  Vitals:   08/20/19 1255 08/20/19 1300  BP:  127/82  Pulse: 82 86  Resp: 13 13  Temp:    SpO2: 97% 94%    Last Pain:  Vitals:   08/20/19 1300  TempSrc:   PainSc: 7                  Samani Deal

## 2019-08-20 NOTE — Interval H&P Note (Signed)
History and Physical Interval Note:  08/20/2019 6:53 AM  David [name]  has presented today for surgery, with the diagnosis of m54.12 cervical radiculopathy.  The various methods of treatment have been discussed with the patient and family. After consideration of risks, benefits and other options for treatment, the patient has consented to  Procedure(s): ANTERIOR CERVICAL DECOMPRESSION/DISCECTOMY FUSION 2 LEVELS C4/5, C5/6 (N/A) as a surgical intervention.  The patient's history has been reviewed, patient examined, no change in status, stable for surgery.  I have reviewed the patient's chart and labs.  Questions were answered to the patient's satisfaction.     [name]

## 2019-08-20 NOTE — Progress Notes (Signed)
Ambulated to BR voided clear yellow urine without difficulty. Denies pain, denies N/V. Tolerated lunch, ate 100%. Awaiting room 130, transferred to room 130 accompanied by RN, CNA., awaiting cervical xrays. Report given to [facility]

## 2019-08-20 NOTE — H&P (Signed)
David [name] is an 64 y.o. male.   Chief Complaint: left arm pain HPI: David [name] is here for reevaluation after having been seen previously with left cervical radiculopathy. He did undergo a repeat steroid injection after successful the first one, however the second 1 did not provide any relief. He states that the pain is so unbearable that is been unable to perform any activity. He has taken steroids previously also but has not been able to find any significant relief. He is here to discuss surgical intervention. MRI shows stenosis on lefta t C4/5 and C5/6 and we discussed a C4-6 ACDF. He is ready to proceed   Past Medical History:  Diagnosis Date  . B12 deficiency   . Complete tear of right rotator cuff   . Compression fracture of spine ([place])   . COPD (chronic obstructive pulmonary disease) ([place])   . DDD (degenerative disc disease), lumbar   . H/O emphysema   . Hyperlipidemia   . Hypothyroidism   . Lung nodule, multiple    3MM. AND 8 MM  . Nocturnal hypoxia   . Pneumonia   . Shortness of breath on exertion   . Sleep apnea   . Thrombocytopenia ([place])   . Thyroid disease     Past Surgical History:  Procedure Laterality Date  . BACK SURGERY  1991   L5-S1  . COLONOSCOPY    . ELBOW ARTHROSCOPY Right   . ESOPHAGOGASTRODUODENOSCOPY (EGD) WITH PROPOFOL N/A 09/26/2017   Procedure: ESOPHAGOGASTRODUODENOSCOPY (EGD) WITH PROPOFOL;  Surgeon: Lin Landsman, MD;  Location: [place];  Service: Gastroenterology;  Laterality: N/A;  . [place]  . KYPHOPLASTY N/A 06/10/2016   Procedure: KYPHOPLASTY T12;  Surgeon: Hessie Knows, MD;  Location: [facility];  Service: Orthopedics;  Laterality: N/A;  . KYPHOPLASTY     T12  . SHOULDER ARTHROSCOPY    . TENODESIS LONG TENDON BICEPS Right   . TONSILLECTOMY    . WISDOM TOOTH EXTRACTION  1982    Family History  Problem Relation Age of Onset  . Heart disease Father    Social History:  reports that he quit smoking about 35  years ago. He has never used smokeless tobacco. He reports current alcohol use of about 14.0 standard drinks of alcohol per week. He reports that he does not use drugs.  Allergies:  Allergies  Allergen Reactions  . Penicillins Other (See Comments)    Unknown/ Childhood    Medications Prior to Admission  Medication Sig Dispense Refill  . budesonide-formoterol (SYMBICORT) 160-4.5 MCG/ACT inhaler Inhale 2 puffs into the lungs 2 (two) times daily. 30.6 g 2  . gabapentin (NEURONTIN) 300 MG capsule Take 300 mg by mouth 3 (three) times daily.    Marland Kitchen levothyroxine (SYNTHROID, LEVOTHROID) 112 MCG tablet Take 112 mcg by mouth daily before breakfast.    . mometasone (NASONEX) 50 MCG/ACT nasal spray Place 2 sprays into the nose daily. (Patient taking differently: Place 2 sprays into the nose daily as needed (congestion). ) 17 g 2  . oxybutynin (DITROPAN-XL) 5 MG 24 hr tablet Take 5 mg by mouth daily.    Marland Kitchen oxyCODONE (OXY IR/ROXICODONE) 5 MG immediate release tablet Take 5 mg by mouth every 6 (six) hours as needed.    Marland Kitchen PROAIR HFA 108 (90 Base) MCG/ACT inhaler INHALE 2 PUFFS BY MOUTH INTO THE LUNGS EVERY 4 HOURS AS NEEDED (Patient taking differently: Inhale 2 puffs into the lungs every 6 (six) hours as needed for wheezing. )  25.5 g 2  . rosuvastatin (CRESTOR) 10 MG tablet Take 10 mg by mouth daily.    Marland Kitchen SPIRIVA RESPIMAT 2.5 MCG/ACT AERS INHALE 2 PUFFS BY MOUTH INTO LUNGS DAILY (Patient taking differently: Inhale 1 puff into the lungs in the morning and at bedtime. ) 12 g 5  . vitamin B-12 (CYANOCOBALAMIN) 1000 MCG tablet Take 1,000 mcg by mouth daily.    . predniSONE (DELTASONE) 20 MG tablet Take 1 tablet (20 mg total) by mouth daily with breakfast. 10 days (Patient not taking: Reported on 08/20/2019) 10 tablet 1    No results found for this or any previous visit (from the past 48 hour(s)). No results found.  Review of Systems General ROS: Negative Respiratory ROS: Negative Cardiovascular ROS:  Negative Gastrointestinal ROS: Negative Genito-Urinary ROS: Negative Musculoskeletal ROS: Positive for neck pain Neurological ROS: Positive for left arm pain Dermatological ROS: Negative  Blood pressure 127/73, pulse 76, temperature 97.8 F (36.6 C), temperature source Tympanic, resp. rate 18, SpO2 96 %. Physical Exam  General appearance: Alert, cooperative, appears very uncomfortable Neck: Range of motion appears full CV: Regular rate and rhythm Pulm: Clear to auscultation  Neurologic exam:  Mental status: alertness: alert, affect: In obvious pain Speech: fluent and clear Motor:strength symmetric 5/5 in bilateral upper extremities throughout all motor groups Sensory: intact to light touch in bilateral upper extremities Gait: normal   MRI cervical spine: There is reversal of the lordotic curvature centered around the C5 level. There is significant degenerative disease noted throughout. At C4-5 there is a large disc protrusion central to the left causing severe foraminal stenosis. At C5-6 there is a disc extrusion causing more moderate foraminal stenosis. There is no other significant stenosis noted throughout.   Assessment/Plan We will proceed with C4/5 and C5/6 ACDF  [name], MD 08/20/2019, 6:48 AM

## 2019-08-20 NOTE — Transfer of Care (Signed)
Immediate Anesthesia Transfer of Care Note  Patient: David [name]  Procedure(s) Performed: ANTERIOR CERVICAL DECOMPRESSION/DISCECTOMY FUSION 2 LEVELS C4/5, C5/6 (N/A )  Patient Location: PACU  Anesthesia Type:General  Level of Consciousness: sedated  Airway & Oxygen Therapy: Patient connected to face mask oxygen  Post-op Assessment: Post -op Vital signs reviewed and stable  Post vital signs: stable  Last Vitals:  Vitals Value Taken Time  BP 132/89 08/20/19 1143  Temp 36.1 C 08/20/19 1143  Pulse 80 08/20/19 1146  Resp 27 08/20/19 1146  SpO2 100 % 08/20/19 1146  Vitals shown include unvalidated device data.  Last Pain:  Vitals:   08/20/19 0612  TempSrc: Tympanic  PainSc: 10-Worst pain ever         Complications: No apparent anesthesia complications

## 2019-08-21 DIAGNOSIS — M5010 Cervical disc disorder with radiculopathy, unspecified cervical region: Secondary | ICD-10-CM | POA: Diagnosis not present

## 2019-08-21 MED ORDER — OXYCODONE HCL 10 MG PO TABS: 10.0000 mg | ORAL_TABLET | ORAL | 0 refills | Status: DC | PRN

## 2019-08-21 MED ORDER — METHOCARBAMOL 500 MG PO TABS: 500.0000 mg | ORAL_TABLET | Freq: Four times a day (QID) | ORAL | 0 refills | Status: AC | PRN

## 2019-08-21 MED ORDER — SENNA 8.6 MG PO TABS: 1.0000 | ORAL_TABLET | Freq: Two times a day (BID) | ORAL | 0 refills | Status: DC

## 2019-08-21 MED ORDER — POLYETHYLENE GLYCOL 3350 17 G PO PACK: 17.0000 g | PACK | Freq: Every day | ORAL | 0 refills | Status: DC | PRN

## 2019-08-21 MED ORDER — ACETAMINOPHEN 325 MG PO TABS: 650.0000 mg | ORAL_TABLET | Freq: Four times a day (QID) | ORAL | Status: AC | PRN

## 2019-08-21 MED ORDER — MENTHOL 3 MG MT LOZG: 1.0000 | LOZENGE | OROMUCOSAL | 12 refills | Status: DC | PRN

## 2019-08-21 NOTE — Plan of Care (Signed)
  Problem: Clinical Measurements: Goal: Respiratory complications will improve Outcome: Progressing Some pain but better  education re presc  for pain for discharge

## 2019-08-21 NOTE — Discharge Instructions (Signed)
°Your surgeon has performed an operation on your cervical spine (neck) to relieve pressure on the spinal cord and/or nerves. This involved making an incision in the front of your neck and removing one or more of the discs that support your spine. Next, a small piece of bone, a titanium plate, and screws were used to fuse two or more of the vertebrae (bones) together. ° °The following are instructions to help in your recovery once you have been discharged from the hospital. Even if you feel well, it is important that you follow these activity guidelines. If you do not let your neck heal properly from the surgery, you can increase the chance of return of your symptoms and other complications. ° °* Do not take anti-inflammatory medications for 3 months after surgery (naproxen [Aleve], ibuprofen [Advil, Motrin], etc.). These medications can prevent your bones from healing properly. ° °Activity  °  °No bending, lifting, or twisting (“BLT”). Avoid lifting objects heavier than 10 pounds (gallon milk jug).  Where possible, avoid household activities that involve lifting, bending, reaching, pushing, or pulling such as laundry, vacuuming, grocery shopping, and childcare. Try to arrange for help from friends and family for these activities while your back heals. ° °Increase physical activity slowly as tolerated.  Taking short walks is encouraged, but avoid strenuous exercise. Do not jog, run, bicycle, lift weights, or participate in any other exercises unless specifically allowed by your doctor. ° °Talk to your doctor before resuming sexual activity. ° °You should not drive until cleared by your doctor. ° °Until released by your doctor, you should not return to work or school.  You should rest at home and let your body heal.  ° °You may shower three days after your surgery.  After showering, lightly dab your incision dry. Do not take a tub bath or go swimming until approved by your doctor at your follow-up appointment. ° °If  your doctor ordered a cervical collar (neck brace) for you, you should wear it whenever you are out of bed. You may remove it when lying down or sleeping, but you should wear it at all other times. Not all neck surgeries require a cervical collar. ° °If you smoke, we strongly recommend that you quit.  Smoking has been proven to interfere with normal bone healing and will dramatically reduce the success rate of your surgery. Please contact [facility] (800-QUIT-NOW) and use the resources at www.[facility].com for assistance in stopping smoking. ° °Surgical Incision °  °If you have a dressing on your incision, you may remove it two days after your surgery. Keep your incision area clean and dry. ° °If you have staples or stitches on your incision, you should have a follow up scheduled for removal. If you do not have staples or stitches, you will have steri-strips (small pieces of surgical tape) or Dermabond glue. The steri-strips/glue should begin to peel away within about a week (it is fine if the steri-strips fall off before then). If the strips are still in place one week after your surgery, you may gently remove them. ° °Diet          ° °You may return to your usual diet. However, you may experience discomfort when swallowing in the first month after your surgery. This is normal. You may find that softer foods are more comfortable for you to swallow. Be sure to stay hydrated. ° °When to Contact Us ° °You may experience pain in your neck and/or pain between your shoulder blades. This   is normal and should improve in the next few weeks with the help of pain medication, muscle relaxers, and rest. Some patients report that a warm compress on the back of the neck or between the shoulder blades helps. ° °However, should you experience any of the following, contact us immediately: °• New numbness or weakness °• Pain that is progressively getting worse, and is not relieved by your pain medication, muscle relaxers, rest, and  warm compresses °• Bleeding, redness, swelling, pain, or drainage from surgical incision °• Chills or flu-like symptoms °• Fever greater than 101.0 F (38.3 C) °• Inability to eat, drink fluids, or take medications °• Problems with bowel or bladder functions °• Difficulty breathing or shortness of breath °• Warmth, tenderness, or swelling in your calf °Contact Information °• During office hours (Monday-Friday 9 am to 5 pm), please call your physician at [phone] and ask for Kendelyn Jean °• After hours and weekends, please call [phone] and an answering service will put you in touch with either Dr. Cook or Dr. Yarbrough.  °• For a life-threatening emergency, call 911 ° ° °

## 2019-08-21 NOTE — Discharge Summary (Signed)
.   Procedure: ACDF C4-6 Procedure date: 08/20/2019 Diagnosis: Cervical radiculopathy   History: David [name] is s/p C4-6 ACDF POD1: David [name] was seen this morning.  He reports his pain is well controlled, he has minimal dysphagia and is able to tolerate soft food.  He has been urinating passing flatus, and ambulating.  He is eager for discharge. POD0: Tolerated procedure well. Evaluated in post op recovery, alert, oriented x 4.   Physical Exam: Vitals:   08/21/19 0427 08/21/19 0819  BP: (!) 136/98 (!) 141/90  Pulse: 88 90  Resp: 18 18  Temp: 98.1 F (36.7 C) (!) 97.4 F (36.3 C)  SpO2: 94% 94%    General: Alert and oriented, sitting in bed Strength:5/5 throughout  Sensation: intact and symmetric throughout  Skin: Neck incision clean, dry, intact. No evidence of hematoma noted  Data:  No results for input(s): NA, K, CL, CO2, BUN, CREATININE, LABGLOM, GLUCOSE, CALCIUM in the last 168 hours. No results for input(s): AST, ALT, ALKPHOS in the last 168 hours.  Invalid input(s): TBILI   No results for input(s): WBC, HGB, HCT, PLT in the last 168 hours. Recent Labs  Lab 08/16/19 0808  APTT 25  INR 1.0           Assessment/Plan:  David [name] is POD1 s/p C4-6 ACDF.  He is doing very well this morning and remains discharge criteria.  He has been ambulating around the room without a walker and feels stable for discharge to home. He reports complete resolution of his left arm pain.  We will discharge him this morning with complete discharge instructions.  A prescription for oxycodone and methocarbamol have been prepared for him.  All questions have been answered, and he knows to call our office should he experience any issues postoperatively.   Lonell Face, NP Department of Neurosurgery

## 2019-08-21 NOTE — Plan of Care (Signed)
  Problem: Clinical Measurements: Goal: Respiratory complications will improve Outcome: Progressing   

## 2019-08-21 NOTE — Progress Notes (Signed)
Pt for discharge home. A/o. No distress wife here to pick pt up. Iv site x 2 d/cd/ instructions discussed with pt. presc for pain given and discussed along with discussion of  Other meds and home meds/ diet activity and f/u discussed verbalized understanding.  Out  At this time via  W/c.  No c/o.

## 2019-08-27 ENCOUNTER — Other Ambulatory Visit: Payer: Self-pay

## 2019-08-27 ENCOUNTER — Encounter: Payer: Self-pay | Admitting: Emergency Medicine

## 2019-08-27 ENCOUNTER — Emergency Department: Payer: BC Managed Care – PPO

## 2019-08-27 DIAGNOSIS — D696 Thrombocytopenia, unspecified: Secondary | ICD-10-CM | POA: Diagnosis not present

## 2019-08-27 DIAGNOSIS — Z79899 Other long term (current) drug therapy: Secondary | ICD-10-CM | POA: Insufficient documentation

## 2019-08-27 DIAGNOSIS — E039 Hypothyroidism, unspecified: Secondary | ICD-10-CM | POA: Insufficient documentation

## 2019-08-27 DIAGNOSIS — M5136 Other intervertebral disc degeneration, lumbar region: Secondary | ICD-10-CM | POA: Diagnosis not present

## 2019-08-27 DIAGNOSIS — M96843 Postprocedural seroma of a musculoskeletal structure following other procedure: Secondary | ICD-10-CM | POA: Diagnosis present

## 2019-08-27 DIAGNOSIS — Z981 Arthrodesis status: Secondary | ICD-10-CM | POA: Insufficient documentation

## 2019-08-27 DIAGNOSIS — Z87891 Personal history of nicotine dependence: Secondary | ICD-10-CM | POA: Diagnosis not present

## 2019-08-27 DIAGNOSIS — E538 Deficiency of other specified B group vitamins: Secondary | ICD-10-CM | POA: Insufficient documentation

## 2019-08-27 DIAGNOSIS — M96842 Postprocedural seroma of a musculoskeletal structure following a musculoskeletal system procedure: Principal | ICD-10-CM | POA: Insufficient documentation

## 2019-08-27 DIAGNOSIS — Y838 Other surgical procedures as the cause of abnormal reaction of the patient, or of later complication, without mention of misadventure at the time of the procedure: Secondary | ICD-10-CM | POA: Insufficient documentation

## 2019-08-27 DIAGNOSIS — Z7951 Long term (current) use of inhaled steroids: Secondary | ICD-10-CM | POA: Insufficient documentation

## 2019-08-27 DIAGNOSIS — E785 Hyperlipidemia, unspecified: Secondary | ICD-10-CM | POA: Diagnosis not present

## 2019-08-27 DIAGNOSIS — J449 Chronic obstructive pulmonary disease, unspecified: Secondary | ICD-10-CM | POA: Insufficient documentation

## 2019-08-27 DIAGNOSIS — Z79890 Hormone replacement therapy (postmenopausal): Secondary | ICD-10-CM | POA: Insufficient documentation

## 2019-08-27 DIAGNOSIS — R1310 Dysphagia, unspecified: Secondary | ICD-10-CM | POA: Diagnosis present

## 2019-08-27 DIAGNOSIS — Z20822 Contact with and (suspected) exposure to covid-19: Secondary | ICD-10-CM | POA: Diagnosis not present

## 2019-08-27 LAB — CBC WITH DIFFERENTIAL/PLATELET
Abs Immature Granulocytes: 0.04 10*3/uL (ref 0.00–0.07)
Basophils Absolute: 0 10*3/uL (ref 0.0–0.1)
Basophils Relative: 0 %
Eosinophils Absolute: 0.1 10*3/uL (ref 0.0–0.5)
Eosinophils Relative: 1 %
HCT: 38.8 % — ABNORMAL LOW (ref 39.0–52.0)
Hemoglobin: 13.3 g/dL (ref 13.0–17.0)
Immature Granulocytes: 1 %
Lymphocytes Relative: 20 %
Lymphs Abs: 1.8 10*3/uL (ref 0.7–4.0)
MCH: 33.4 pg (ref 26.0–34.0)
MCHC: 34.3 g/dL (ref 30.0–36.0)
MCV: 97.5 fL (ref 80.0–100.0)
Monocytes Absolute: 0.7 10*3/uL (ref 0.1–1.0)
Monocytes Relative: 8 %
Neutro Abs: 6.2 10*3/uL (ref 1.7–7.7)
Neutrophils Relative %: 70 %
Platelets: 130 10*3/uL — ABNORMAL LOW (ref 150–400)
RBC: 3.98 MIL/uL — ABNORMAL LOW (ref 4.22–5.81)
RDW: 11.9 % (ref 11.5–15.5)
WBC: 8.7 10*3/uL (ref 4.0–10.5)
nRBC: 0 % (ref 0.0–0.2)

## 2019-08-27 LAB — COMPREHENSIVE METABOLIC PANEL
ALT: 24 U/L (ref 0–44)
AST: 14 U/L — ABNORMAL LOW (ref 15–41)
Albumin: 3.6 g/dL (ref 3.5–5.0)
Alkaline Phosphatase: 39 U/L (ref 38–126)
Anion gap: 8 (ref 5–15)
BUN: 17 mg/dL (ref 8–23)
CO2: 27 mmol/L (ref 22–32)
Calcium: 8.7 mg/dL — ABNORMAL LOW (ref 8.9–10.3)
Chloride: 102 mmol/L (ref 98–111)
Creatinine, Ser: 1 mg/dL (ref 0.61–1.24)
GFR calc Af Amer: >60 mL/min (ref >60)
GFR calc non Af Amer: >60 mL/min (ref >60)
Glucose, Bld: 120 mg/dL — ABNORMAL HIGH (ref 70–99)
Potassium: 4.1 mmol/L (ref 3.5–5.1)
Sodium: 137 mmol/L (ref 135–145)
Total Bilirubin: 0.7 mg/dL (ref 0.3–1.2)
Total Protein: 6.8 g/dL (ref 6.5–8.1)

## 2019-08-27 MED ORDER — IOHEXOL 300 MG/ML  SOLN
75.0000 mL | Freq: Once | INTRAMUSCULAR | Status: AC | PRN
  Administered 2019-08-27: 75 mL via INTRAVENOUS

## 2019-08-27 NOTE — ED Triage Notes (Signed)
Has surgery last Monday. Anterior Cervical Diessectomy Fusion.  Surgery by Dr. [name].  States initially every thing was going well, but over past couple of days, pain has increased and having difficulty swallowing.    AAOx3.  Skin warm and dry no apparent distress

## 2019-08-28 ENCOUNTER — Observation Stay: Payer: BC Managed Care – PPO

## 2019-08-28 ENCOUNTER — Observation Stay
Admission: EM | Admit: 2019-08-28 | Discharge: 2019-08-29 | Disposition: A | Discharge status: Home / Self Care | Payer: BC Managed Care – PPO | Attending: Neurosurgery | Admitting: Neurosurgery

## 2019-08-28 DIAGNOSIS — R1310 Dysphagia, unspecified: Secondary | ICD-10-CM

## 2019-08-28 DIAGNOSIS — M96843 Postprocedural seroma of a musculoskeletal structure following other procedure: Secondary | ICD-10-CM

## 2019-08-28 LAB — MRSA PCR SCREENING: MRSA by PCR: NEGATIVE

## 2019-08-28 LAB — SARS CORONAVIRUS 2 BY RT PCR (HOSPITAL ORDER, PERFORMED IN ~~LOC~~ HOSPITAL LAB): SARS Coronavirus 2: NEGATIVE

## 2019-08-28 LAB — GLUCOSE, CAPILLARY: Glucose-Capillary: 174 mg/dL — ABNORMAL HIGH (ref 70–99)

## 2019-08-28 MED ORDER — ACETAMINOPHEN 10 MG/ML IV SOLN
1000.0000 mg | Freq: Four times a day (QID) | INTRAVENOUS | Status: AC
  Administered 2019-08-28 – 2019-08-29 (×3): 1000 mg via INTRAVENOUS
  Filled 2019-08-28 (×6): qty 100

## 2019-08-28 MED ORDER — FLUTICASONE PROPIONATE 50 MCG/ACT NA SUSP
1.0000 | Freq: Every day | NASAL | Status: DC
  Administered 2019-08-29: 1 via NASAL
  Filled 2019-08-28: qty 16

## 2019-08-28 MED ORDER — MORPHINE SULFATE (PF) 2 MG/ML IV SOLN
2.0000 mg | INTRAVENOUS | Status: DC | PRN
  Administered 2019-08-28: 2 mg via INTRAVENOUS
  Filled 2019-08-28: qty 1

## 2019-08-28 MED ORDER — ONDANSETRON HCL 4 MG/2ML IJ SOLN: 4.0000 mg | Freq: Four times a day (QID) | INTRAMUSCULAR | Status: DC | PRN

## 2019-08-28 MED ORDER — TIOTROPIUM BROMIDE MONOHYDRATE 18 MCG IN CAPS
1.0000 | ORAL_CAPSULE | Freq: Two times a day (BID) | RESPIRATORY_TRACT | Status: DC
  Administered 2019-08-28 – 2019-08-29 (×2): 18 ug via RESPIRATORY_TRACT
  Filled 2019-08-28: qty 5

## 2019-08-28 MED ORDER — ACETAMINOPHEN 650 MG RE SUPP: 650.0000 mg | RECTAL | Status: DC | PRN

## 2019-08-28 MED ORDER — DEXTROSE-NACL 5-0.45 % IV SOLN
INTRAVENOUS | Status: DC
  Administered 2019-08-28: 100 mL/h via INTRAVENOUS

## 2019-08-28 MED ORDER — DEXAMETHASONE SODIUM PHOSPHATE 10 MG/ML IJ SOLN
10.0000 mg | Freq: Once | INTRAMUSCULAR | Status: AC
  Administered 2019-08-28: 10 mg via INTRAVENOUS
  Filled 2019-08-28: qty 1

## 2019-08-28 MED ORDER — ALBUTEROL SULFATE (2.5 MG/3ML) 0.083% IN NEBU: 3.0000 mL | INHALATION_SOLUTION | Freq: Four times a day (QID) | RESPIRATORY_TRACT | Status: DC | PRN

## 2019-08-28 MED ORDER — DEXAMETHASONE SODIUM PHOSPHATE 4 MG/ML IJ SOLN
4.0000 mg | Freq: Four times a day (QID) | INTRAMUSCULAR | Status: DC
  Administered 2019-08-28 – 2019-08-29 (×5): 4 mg via INTRAVENOUS
  Filled 2019-08-28 (×8): qty 1

## 2019-08-28 MED ORDER — GABAPENTIN 300 MG PO CAPS
300.0000 mg | ORAL_CAPSULE | Freq: Three times a day (TID) | ORAL | Status: DC
  Administered 2019-08-28 – 2019-08-29 (×3): 300 mg via ORAL
  Filled 2019-08-28 (×3): qty 1

## 2019-08-28 MED ORDER — FENTANYL CITRATE (PF) 100 MCG/2ML IJ SOLN
50.0000 ug | Freq: Once | INTRAMUSCULAR | Status: AC
  Administered 2019-08-28: 50 ug via INTRAVENOUS
  Filled 2019-08-28: qty 2

## 2019-08-28 MED ORDER — ONDANSETRON HCL 4 MG/2ML IJ SOLN
4.0000 mg | Freq: Once | INTRAMUSCULAR | Status: AC
  Administered 2019-08-28: 4 mg via INTRAVENOUS
  Filled 2019-08-28: qty 2

## 2019-08-28 MED ORDER — OXYBUTYNIN CHLORIDE ER 5 MG PO TB24
5.0000 mg | ORAL_TABLET | Freq: Every day | ORAL | Status: DC
  Administered 2019-08-29: 5 mg via ORAL
  Filled 2019-08-28: qty 1

## 2019-08-28 MED ORDER — MOMETASONE FURO-FORMOTEROL FUM 200-5 MCG/ACT IN AERO
2.0000 | INHALATION_SPRAY | Freq: Two times a day (BID) | RESPIRATORY_TRACT | Status: DC
  Administered 2019-08-28 – 2019-08-29 (×2): 2 via RESPIRATORY_TRACT
  Filled 2019-08-28: qty 8.8

## 2019-08-28 MED ORDER — ONDANSETRON HCL 4 MG PO TABS: 4.0000 mg | ORAL_TABLET | Freq: Four times a day (QID) | ORAL | Status: DC | PRN

## 2019-08-28 MED ORDER — ROSUVASTATIN CALCIUM 10 MG PO TABS
10.0000 mg | ORAL_TABLET | Freq: Every day | ORAL | Status: DC
  Administered 2019-08-29: 10 mg via ORAL
  Filled 2019-08-28: qty 1

## 2019-08-28 MED ORDER — LEVOTHYROXINE SODIUM 112 MCG PO TABS
112.0000 ug | ORAL_TABLET | Freq: Every day | ORAL | Status: DC
  Administered 2019-08-29 (×2): 112 ug via ORAL
  Filled 2019-08-28 (×2): qty 1

## 2019-08-28 MED ORDER — CHLORHEXIDINE GLUCONATE CLOTH 2 % EX PADS
6.0000 | MEDICATED_PAD | Freq: Every day | CUTANEOUS | Status: DC
  Administered 2019-08-28: 6 via TOPICAL

## 2019-08-28 MED ORDER — ACETAMINOPHEN 325 MG PO TABS
650.0000 mg | ORAL_TABLET | ORAL | Status: DC | PRN
  Administered 2019-08-29: 650 mg via ORAL
  Filled 2019-08-28: qty 2

## 2019-08-28 MED ORDER — LIDOCAINE 5 % EX PTCH
1.0000 | MEDICATED_PATCH | CUTANEOUS | Status: DC
  Administered 2019-08-29: 1 via TRANSDERMAL
  Filled 2019-08-28 (×2): qty 1

## 2019-08-28 NOTE — Progress Notes (Signed)
0943 Patient received via wheelchair to room ICU 19. Patient wearing a simple mask, alert ,oriented, breathing without issues and swallowing without issues. Lungs clear with just a tiny bit of stridor noted in upper airway in neck area. No uncontrollable oral secretions or drulling noted. Patients speech is clear. Words well formed. No slurring noted or dysphagia noted.

## 2019-08-28 NOTE — Progress Notes (Signed)
  Speech Language Pathology Treatment: (self-care education)  Patient Details Name: David [name] MRN: [id number] DOB: [birth year] Today's Date: 08/28/2019 Time: [id number] SLP Time Calculation (min) (ACUTE ONLY): 16 min  Assessment / Plan / Recommendation Clinical Impression  Extensive time spent providing education to pt on swallowing function, rationale for symptoms pt is experiencing and diet recommendation. Examples of mixed consistencies were discussed as well as having medicine crushed in puree until his symptoms resolve. Pt's questions were answered to his satisfication.   HPI HPI: David [name] is a 64 y.o. male postop day 8 from anterior cervical discectomy and fusion by Dr. Lacinda Axon who presents for evaluation of difficulty swallowing.  Patient reports that he was doing well until 2 days ago.  Has had progressively worsening difficulty swallowing but still able to handle his saliva, swallow liquids and solids.  He reports minimal pain with swallowing, no swelling of the neck, no headache.  Does report that this evening he has a temp of 100F.  Has no difficulty breathing.  He called his surgeon's office and was instructed to come to the emergency room for evaluation.       SLP Plan   No further treatment at this time.        Recommendations  Diet recommendations: Regular;Thin liquid Liquids provided via: Cup Medication Administration: Crushed with puree Compensations: Minimize environmental distractions;Slow rate;Small sips/bites Postural Changes and/or Swallow Maneuvers: Seated upright 90 degrees                Oral Care Recommendations: Oral care BID Follow up Recommendations: None SLP Visit Diagnosis: Dysphagia, unspecified (R13.10)       GO             David [name] M.S., CCC-SLP, [place] Office [phone]    Stormy Fabian 08/28/2019, 10:17 AM
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
## 2019-08-28 NOTE — H&P (Signed)
Identifying Statement:  David [name] is a 64 y.o. male from [place] [id number] with recent ACDF  History of Present Illness:  David [name] returns today after an ACDF 1 week ago that was uneventful. He was discharged home with improved left arm pain and neurologically intact. He was tolerating a regular diet at that time. He states approximate 2 days ago he started noticing some more difficulty with swallowing but he is able to take plenty of liquids. He did comment that he ate a regular meal on 5/24. He has noted some hoarseness. He additionally is dealing with some scapular pain, especially on the right but also sometimes on the left. Some of the pain will go down towards the shoulder and the left but he is not having any radiating pain such as he did before surgery. He presented to the ED for evaluation and did undergo a CT of the cervical spine.  Past Medical History:      Past Medical History:  Diagnosis Date  . B12 deficiency   . Complete tear of right rotator cuff   . Compression fracture of spine ([place])   . COPD (chronic obstructive pulmonary disease) ([place])   . DDD (degenerative disc disease), lumbar   . H/O emphysema   . Hyperlipidemia   . Hypothyroidism   . Lung nodule, multiple    3MM. AND 8 MM  . Nocturnal hypoxia   . Pneumonia   . Shortness of breath on exertion   . Sleep apnea   . Thrombocytopenia ([place])   . Thyroid disease    Social History:  Social History        Socioeconomic History  . Marital status: Married    Spouse name: Not on file  . Number of children: Not on file  . Years of education: Not on file  . Highest education level: Not on file  Occupational History  . Not on file  Tobacco Use  . Smoking status: Former Smoker    Quit date: 06/09/1984    Years since quitting: 35.2  . Smokeless tobacco: Never Used  Substance and Sexual Activity  . Alcohol use: Yes    Alcohol/week: 14.0 standard drinks    Types: 14 Cans of beer per week    Comment: 1-2  beers daily  . Drug use: No  . Sexual activity: Not on file  Other Topics Concern  . Not on file  Social History Narrative  . Not on file   Social Determinants of Health      Financial Resource Strain:   . Difficulty of Paying Living Expenses:   Food Insecurity:   . Worried About Charity fundraiser in the Last Year:   . Arboriculturist in the Last Year:   Transportation Needs:   . Film/video editor (Medical):   Marland Kitchen Lack of Transportation (Non-Medical):   Physical Activity:   . Days of Exercise per Week:   . Minutes of Exercise per Session:   Stress:   . Feeling of Stress :   Social Connections:   . Frequency of Communication with Friends and Family:   . Frequency of Social Gatherings with Friends and Family:   . Attends Religious Services:   . Active Member of Clubs or Organizations:   . Attends Archivist Meetings:   Marland Kitchen Marital Status:   Intimate Partner Violence:   . Fear of Current or Ex-Partner:   . Emotionally Abused:   Marland Kitchen Physically Abused:   .  Sexually Abused:    Family History:       Family History  Problem Relation Age of Onset  . Heart disease Father    Review of Systems:  Review of Systems - General ROS: Negative  Psychological ROS: Negative  Ophthalmic ROS: Negative  ENT ROS: Positive for hoarseness  Hematological and Lymphatic ROS: Negative  Endocrine ROS: Negative  Respiratory ROS: Negative  Cardiovascular ROS: Negative  Gastrointestinal ROS: Positive for difficulty swallowing  Genito-Urinary ROS: Negative  Musculoskeletal ROS: Positive for neck and scapular pain  Neurological ROS: Negative for weakness or numbness  Dermatological ROS: Negative  Physical Exam:  BP 92/68  Pulse 68  Temp 98.6 F (37 C) (Oral)  Resp 20  Ht 5\' 9"  (1.753 m)  Wt 86.2 kg  SpO2 93%  BMI 28.06 kg/m Body mass index is 28.06 kg/m. Body surface area is 2.05 meters squared.  General appearance: Alert, cooperative, in no acute distress  Head:  Normocephalic, atraumatic  Eyes: Normal, EOM intact  Oropharynx: Appears dry  Neck: Incision is well approximated in the left anterior area, some induration around incision but the neck remains soft, trachea midline  Ext: No edema in LE bilaterally  Neurologic exam:  Mental status: alertness: alert, affect: normal  Speech: fluent and clear  Motor:strength symmetric 5/5 in bilateral upper and lower extremities  Sensory: intact to light touch in all extremities  Gait: Not tested  Laboratory:       Results for orders placed or performed during the hospital encounter of 08/28/19  CBC with Differential  Result Value Ref Range   WBC 8.7 4.0 - 10.5 K/uL   RBC 3.98 (L) 4.22 - 5.81 MIL/uL   Hemoglobin 13.3 13.0 - 17.0 g/dL   HCT 38.8 (L) 39.0 - 52.0 %   MCV 97.5 80.0 - 100.0 fL   MCH 33.4 26.0 - 34.0 pg   MCHC 34.3 30.0 - 36.0 g/dL   RDW 11.9 11.5 - 15.5 %   Platelets 130 (L) 150 - 400 K/uL   nRBC 0.0 0.0 - 0.2 %   Neutrophils Relative % 70 %   Neutro Abs 6.2 1.7 - 7.7 K/uL   Lymphocytes Relative 20 %   Lymphs Abs 1.8 0.7 - 4.0 K/uL   Monocytes Relative 8 %   Monocytes Absolute 0.7 0.1 - 1.0 K/uL   Eosinophils Relative 1 %   Eosinophils Absolute 0.1 0.0 - 0.5 K/uL   Basophils Relative 0 %   Basophils Absolute 0.0 0.0 - 0.1 K/uL   Immature Granulocytes 1 %   Abs Immature Granulocytes 0.04 0.00 - 0.07 K/uL  Comprehensive metabolic panel  Result Value Ref Range   Sodium 137 135 - 145 mmol/L   Potassium 4.1 3.5 - 5.1 mmol/L   Chloride 102 98 - 111 mmol/L   CO2 27 22 - 32 mmol/L   Glucose, Bld 120 (H) 70 - 99 mg/dL   BUN 17 8 - 23 mg/dL   Creatinine, Ser 1.00 0.61 - 1.24 mg/dL   Calcium 8.7 (L) 8.9 - 10.3 mg/dL   Total Protein 6.8 6.5 - 8.1 g/dL   Albumin 3.6 3.5 - 5.0 g/dL   AST 14 (L) 15 - 41 U/L   ALT 24 0 - 44 U/L   Alkaline Phosphatase 39 38 - 126 U/L   Total Bilirubin 0.7 0.3 - 1.2 mg/dL   GFR calc non Af Amer >60 >60 mL/min   GFR calc Af Amer >60 >60 mL/min   Anion  gap  8 5 - 15   I personally reviewed labs  Imaging:  CT cervical spine: There is adequate placement of the anterior hardware. There is prevertebral swelling that measures up to 2 cm but no acute hemorrhage seen. There is a mixture of fluid and air consistent with the postoperative state.  Impression/Plan:  David [name] is here for postoperative dysphagia and I do think this is related to the swelling we are seeing on the CT scan. There is concern for seroma versus postoperative fluid and he does look well from a respiratory standpoint at this time and therefore I did recommend proceeding with a swallow evaluation. We have placed him on a liquid diet currently and will work on pain control. We will follow up on the results of the swallow study in order to determine next course of action. We did discuss surgery is rarely needed in these instances unless the swallow study reveals any abnormalities. I do think infection is low at this point as he is a week out and has normal blood values.  1. Diagnosis: Postoperative dysphagia  2. Plan  -Clear liquid diet  -Swallow evaluation by speech therapy  -Pain control

## 2019-08-28 NOTE — Progress Notes (Signed)
CSW acknowledges consult for PT/OT/SLP/DME as needed. Please submit consults if/when appropriate and CSW will follow up based on recommendations from these disciplines.  Oleh Genin, [place]

## 2019-08-28 NOTE — ED Notes (Signed)
Pt transported to DG.  

## 2019-08-28 NOTE — Consult Note (Signed)
Neurosurgery-New Consultation Evaluation 08/28/2019 David [name] [id number]  Identifying Statement: David [name] [name] is a 64 y.o. male from [place] [id number] with recent ACDF  History of Present Illness: David [name] returns today after an ACDF 1 week ago that was uneventful.  He was discharged home with improved left arm pain and neurologically intact.  He was tolerating a regular diet at that time.  He states approximate 2 days ago he started noticing some more difficulty with swallowing but he is able to take plenty of liquids.  He did comment that he ate a regular meal on 5/24.  He has noted some hoarseness.  He additionally is dealing with some scapular pain, especially on the right but also sometimes on the left.  Some of the pain will go down towards the shoulder and the left but he is not having any radiating pain such as he did before surgery.  He presented to the ED for evaluation and did undergo a CT of the cervical spine.  Past Medical History:  Past Medical History:  Diagnosis Date  . B12 deficiency   . Complete tear of right rotator cuff   . Compression fracture of spine ([place])   . COPD (chronic obstructive pulmonary disease) ([place])   . DDD (degenerative disc disease), lumbar   . H/O emphysema   . Hyperlipidemia   . Hypothyroidism   . Lung nodule, multiple    3MM. AND 8 MM  . Nocturnal hypoxia   . Pneumonia   . Shortness of breath on exertion   . Sleep apnea   . Thrombocytopenia ([place])   . Thyroid disease     Social History: Social History   Socioeconomic History  . Marital status: Married    Spouse name: Not on file  . Number of children: Not on file  . Years of education: Not on file  . Highest education level: Not on file  Occupational History  . Not on file  Tobacco Use  . Smoking status: Former Smoker    Quit date: 06/09/1984    Years since quitting: 35.2  . Smokeless tobacco: Never Used  Substance and Sexual Activity  . Alcohol use: Yes     Alcohol/week: 14.0 standard drinks    Types: 14 Cans of beer per week    Comment: 1-2 beers daily  . Drug use: No  . Sexual activity: Not on file  Other Topics Concern  . Not on file  Social History Narrative  . Not on file   Social Determinants of Health   Financial Resource Strain:   . Difficulty of Paying Living Expenses:   Food Insecurity:   . Worried About Charity fundraiser in the Last Year:   . Arboriculturist in the Last Year:   Transportation Needs:   . Film/video editor (Medical):   Marland Kitchen Lack of Transportation (Non-Medical):   Physical Activity:   . Days of Exercise per Week:   . Minutes of Exercise per Session:   Stress:   . Feeling of Stress :   Social Connections:   . Frequency of Communication with Friends and Family:   . Frequency of Social Gatherings with Friends and Family:   . Attends Religious Services:   . Active Member of Clubs or Organizations:   . Attends Archivist Meetings:   Marland Kitchen Marital Status:   Intimate Partner Violence:   . Fear of Current or Ex-Partner:   . Emotionally Abused:   .  Physically Abused:   . Sexually Abused:     Family History: Family History  Problem Relation Age of Onset  . Heart disease Father     Review of Systems:  Review of Systems - General ROS: Negative Psychological ROS: Negative Ophthalmic ROS: Negative ENT ROS: Positive for hoarseness Hematological and Lymphatic ROS: Negative  Endocrine ROS: Negative Respiratory ROS: Negative Cardiovascular ROS: Negative Gastrointestinal ROS: Positive for difficulty swallowing Genito-Urinary ROS: Negative Musculoskeletal ROS: Positive for neck and scapular pain Neurological ROS: Negative for weakness or numbness Dermatological ROS: Negative  Physical Exam: BP 92/68   Pulse 68   Temp 98.6 F (37 C) (Oral)   Resp 20   Ht 5\' 9"  (1.753 m)   Wt 86.2 kg   SpO2 93%   BMI 28.06 kg/m  Body mass index is 28.06 kg/m. Body surface area is 2.05 meters  squared. General appearance: Alert, cooperative, in no acute distress Head: Normocephalic, atraumatic Eyes: Normal, EOM intact Oropharynx: Appears dry Neck: Incision is well approximated in the left anterior area, some induration around incision but the neck remains soft, trachea midline Ext: No edema in LE bilaterally  Neurologic exam:  Mental status: alertness: alert,  affect: normal Speech: fluent and clear Motor:strength symmetric 5/5 in bilateral upper and lower extremities Sensory: intact to light touch in all extremities Gait: Not tested  Laboratory: Results for orders placed or performed during the hospital encounter of 08/28/19  CBC with Differential  Result Value Ref Range   WBC 8.7 4.0 - 10.5 K/uL   RBC 3.98 (L) 4.22 - 5.81 MIL/uL   Hemoglobin 13.3 13.0 - 17.0 g/dL   HCT 38.8 (L) 39.0 - 52.0 %   MCV 97.5 80.0 - 100.0 fL   MCH 33.4 26.0 - 34.0 pg   MCHC 34.3 30.0 - 36.0 g/dL   RDW 11.9 11.5 - 15.5 %   Platelets 130 (L) 150 - 400 K/uL   nRBC 0.0 0.0 - 0.2 %   Neutrophils Relative % 70 %   Neutro Abs 6.2 1.7 - 7.7 K/uL   Lymphocytes Relative 20 %   Lymphs Abs 1.8 0.7 - 4.0 K/uL   Monocytes Relative 8 %   Monocytes Absolute 0.7 0.1 - 1.0 K/uL   Eosinophils Relative 1 %   Eosinophils Absolute 0.1 0.0 - 0.5 K/uL   Basophils Relative 0 %   Basophils Absolute 0.0 0.0 - 0.1 K/uL   Immature Granulocytes 1 %   Abs Immature Granulocytes 0.04 0.00 - 0.07 K/uL  Comprehensive metabolic panel  Result Value Ref Range   Sodium 137 135 - 145 mmol/L   Potassium 4.1 3.5 - 5.1 mmol/L   Chloride 102 98 - 111 mmol/L   CO2 27 22 - 32 mmol/L   Glucose, Bld 120 (H) 70 - 99 mg/dL   BUN 17 8 - 23 mg/dL   Creatinine, Ser 1.00 0.61 - 1.24 mg/dL   Calcium 8.7 (L) 8.9 - 10.3 mg/dL   Total Protein 6.8 6.5 - 8.1 g/dL   Albumin 3.6 3.5 - 5.0 g/dL   AST 14 (L) 15 - 41 U/L   ALT 24 0 - 44 U/L   Alkaline Phosphatase 39 38 - 126 U/L   Total Bilirubin 0.7 0.3 - 1.2 mg/dL   GFR calc non Af  Amer >60 >60 mL/min   GFR calc Af Amer >60 >60 mL/min   Anion gap 8 5 - 15   I personally reviewed labs  Imaging: CT cervical spine: There is adequate  placement of the anterior hardware.  There is prevertebral swelling that measures up to 2 cm but no acute hemorrhage seen.  There is a mixture of fluid and air consistent with the postoperative state.   Impression/Plan:  David [name] is here for postoperative dysphagia and I do think this is related to the swelling we are seeing on the CT scan.  There is concern for seroma versus postoperative fluid and he does look well from a respiratory standpoint at this time and therefore I did recommend proceeding with a swallow evaluation.  We have placed him on a liquid diet currently and will work on pain control.  We will follow up on the results of the swallow study in order to determine next course of action.  We did discuss surgery is rarely needed in these instances unless the swallow study reveals any abnormalities.  I do think infection is low at this point as he is a week out and has normal blood values.   1.  Diagnosis: Postoperative dysphagia  2.  Plan -Clear liquid diet -Swallow evaluation by speech therapy -Pain control

## 2019-08-28 NOTE — Progress Notes (Signed)
Modified Barium Swallow Progress Note  Patient Details  Name: David [name] MRN: [id number] Date of Birth: 1955/10/03  Today's Date: 08/28/2019  Modified Barium Swallow completed.  Full report located under Chart Review in the Imaging Section.  Brief recommendations include the following:  Clinical Impression  Pt presents with adequate oropharyngeal abilities. He is able ot protect his airway across all consistencies trials. Physiologically, pt has severe posterior pharyngeal wall edema specifically at C4-C6 from recent ACDF. When consuming thin liquids and nectar thick liquids, pt has intermittent difficulty with epiglottic inversation and he utilizes multiple swallows. When consuming solids, pt's pharyngeal phase is swift with only one swallow needed. As such, recommend pt return to solids, thin liquids via cup, no mixed consistencies and medicine crushed in puree.    Swallow Evaluation Recommendations       SLP Diet Recommendations: Regular solids;Thin liquid   Liquid Administration via: Cup   Medication Administration: Crushed with puree   Supervision: Patient able to self feed   Compensations: Minimize environmental distractions;Slow rate;Small sips/bites   Postural Changes: Seated upright at 90 degrees   Oral Care Recommendations: Oral care BID     [name] B. Rutherford Nail, M.S., CCC-SLP, [facility]ologist [facility] Services Office [phone]     [name] 08/28/2019,10:12 AM

## 2019-08-29 ENCOUNTER — Other Ambulatory Visit: Payer: Self-pay

## 2019-08-29 DIAGNOSIS — J449 Chronic obstructive pulmonary disease, unspecified: Secondary | ICD-10-CM

## 2019-08-29 MED ORDER — OXYCODONE HCL 10 MG PO TABS: 10.0000 mg | ORAL_TABLET | ORAL | 0 refills | Status: AC | PRN

## 2019-08-29 MED ORDER — LIDOCAINE 5 % EX PTCH: 1.0000 | MEDICATED_PATCH | CUTANEOUS | 0 refills | Status: DC

## 2019-08-29 MED ORDER — METHYLPREDNISOLONE 4 MG PO TBPK
ORAL_TABLET | ORAL | 0 refills | Status: DC
Start: 2019-08-29 — End: 2022-06-21

## 2019-08-29 NOTE — Telephone Encounter (Signed)
ATC patient due to [name] receiving a refill request for Symbicort. Patient is due for appointment. Last OV was 03/07/2019. If he calls back he needs to be scheduled for an appointment and then we can refill inhaler.

## 2019-08-29 NOTE — Discharge Instructions (Signed)
Please take the Medrol dosepak we have sent into your pharmacy. Call our office with additional concerns. If you experience shortness of breath or worsening swallowing problems, call our office.

## 2019-08-29 NOTE — Progress Notes (Signed)
Pt ready for discharge home today per MD. Patient assessment unchanged from this morning. Discharge instructions and prescriptions reviewed with pt by SWOT RN. PIV removed, VSS. Pt assisted to car via NT. [name] notified of pt's request for percocet prescription.  David [name]

## 2019-08-29 NOTE — Discharge Summary (Signed)
Procedure: ACDF C4-6 Procedure date: 08/20/2019 Diagnosis: cervical radiculopathy   History: David [name] is 8 days s/p ACDF C4-6 which was uneventful. Per Dr. Jonathon Jordan note from 08/28/2019: " He was discharged home with improved left arm pain and neurologically intact.  He was tolerating a regular diet at that time.  He states approximate 2 days ago he started noticing some more difficulty with swallowing but he is able to take plenty of liquids.  He did comment that he ate a regular meal on 5/24.  He has noted some hoarseness.  He additionally is dealing with some scapular pain, especially on the right but also sometimes on the left.  Some of the pain will go down towards the shoulder and the left but he is not having any radiating pain such as he did before surgery.  He presented to the ED for evaluation and did undergo a CT of the cervical spine."  He was admitted for observation overnight and received IV steroids. His stay was uneventful and he endorsed improved swallowing after administration of IV steroids. This morning, he was tolerating french toast and oatmeal without significant dysphagia.   Cervical CT completed which showed "prevertebral fluid and gas collection which may reflect a postoperative seroma or hematoma although abscess is not excluded by imaging".  He was evaluated by ENT with the following results: "Pt presents with adequate oropharyngeal abilities. He is able to protect his airway across all consistencies trials. Physiologically, pt has severe posterior pharyngeal wall edema specifically at C4-C6 from recent ACDF. When consuming thin liquids and nectar thick liquids, pt has intermittent difficulty with epiglottic inversation and he utilizes multiple swallows. When consuming solids, pt's pharyngeal phase is swift with only one swallow needed. As such, recommend pt return to solids, thin liquids via cup, no mixed consistencies and medicine crushed in puree."     Physical  Exam: Vitals:   08/29/19 0734 08/29/19 1158  BP: 116/85 124/85  Pulse: 76 99  Resp: 18 18  Temp: 97.7 F (36.5 C) 97.9 F (36.6 C)  SpO2: 97% 93%    General: Alert and oriented, walking in bed Strength:5/5 throughout  Sensation: intact and symmetric throughout  Skin: Incision to neck clean, dry, intact  Data:  Recent Labs  Lab 08/27/19 1822  NA 137  K 4.1  CL 102  CO2 27  BUN 17  CREATININE 1.00  GLUCOSE 120*  CALCIUM 8.7*   Recent Labs  Lab 08/27/19 1822  AST 14*  ALT 24  ALKPHOS 39     Recent Labs  Lab 08/27/19 1822  WBC 8.7  HGB 13.3  HCT 38.8*  PLT 130*   No results for input(s): APTT, INR in the last 168 hours.       Other tests/results:  CT Neck w/ contrast 08/27/2019: Sequelae of recent C4-C6 ACDF with prevertebral fluid and gas collection which may reflect a postoperative seroma or hematoma although abscess is not excluded by imaging.    Assessment/Plan:  David [name] is POD8 s/p ACDF C4-6.  His swallowing appears to have improved with the administration of steroids.  He is afebrile.  No evidence of infection.  Given the reassuring evaluation by speech pathology, and the fact that he was able to tolerate food this morning, the patient will be discharged home today.  He was given instructions, including to call our office if swallowing worsens or he has difficulty breathing.  A Medrol Dosepak was sent into his pharmacy with instructions for  use. We will see him in clinic next week for follow-up appointment.   Lonell Face, NP Department of Neurosurgery

## 2019-08-29 NOTE — TOC Transition Note (Signed)
Transition of Care [facility]) - CM/SW Discharge Note   Patient Details  Name: CLOY COZZENS MRN: [id number] Date of Birth: 21-Oct-1955  [facility]) CM/SW Contact:  Su Hilt, RN Phone Number: 08/29/2019, 10:36 AM   Clinical Narrative:    Met with the patient to determine needs at DC and DC plan, he lives at home with his wife and is able to ambulate well without assistance, he Does not want or need PT at Home or other [facility] services, he has no needs for DME, He inquired about drinking thicker things like apple sauce, I explained if he needed a thicker liquid and if that is what the physician is recommending then they make a thickener for liquids that does not change the flavor just the thickness, He stated that he felt that would be easier to swallow.  I encouraged him to follow the physicians recommendations and orders.   He agrees, He stated that he has no needs at home, I provided him with my contact information and let him know I will monitor for needs until he discharges, he stated he would like to go home today before 2 PM   Final next level of care: Home/Self Care Barriers to Discharge: Barriers Resolved   Patient Goals and CMS Choice Patient states their goals for this hospitalization and ongoing recovery are:: wants to go home by 2 PM      Discharge Placement                       Discharge Plan and Services   Discharge Planning Services: CM Consult            DME Arranged: N/A         HH Arranged: NA          Social Determinants of Health (SDOH) Interventions     Readmission Risk Interventions No flowsheet data found.

## 2019-08-30 NOTE — Telephone Encounter (Signed)
LMTCB x2  

## 2019-08-31 MED ORDER — BUDESONIDE-FORMOTEROL FUMARATE 160-4.5 MCG/ACT IN AERO: 2.0000 | INHALATION_SPRAY | Freq: Two times a day (BID) | RESPIRATORY_TRACT | 0 refills | Status: DC

## 2019-08-31 NOTE — Telephone Encounter (Signed)
Spoke with the pt  There are no appts pending with [facility] at this time  Advised will refill symbicort and then put in recall for his rov with [facility]  I offered ov with another provider for f/u and he refused

## 2019-11-01 ENCOUNTER — Telehealth: Payer: Self-pay | Admitting: Internal Medicine

## 2019-11-01 DIAGNOSIS — J309 Allergic rhinitis, unspecified: Secondary | ICD-10-CM

## 2019-11-01 MED ORDER — MOMETASONE FUROATE 50 MCG/ACT NA SUSP: 2.0000 | Freq: Two times a day (BID) | NASAL | 0 refills | Status: DC

## 2019-11-01 MED ORDER — MOMETASONE FUROATE 50 MCG/ACT NA SUSP: 2.0000 | Freq: Every day | NASAL | 0 refills | Status: DC

## 2019-11-01 NOTE — Telephone Encounter (Signed)
Rx for nasonex 2 spray BID has been sent to preferred pharmacy. Pt is aware and voiced his understanding.  I have contacted [facility] and requested that Rx for 2 sprays daily be canceled.

## 2019-11-01 NOTE — Telephone Encounter (Signed)
Proceed with nasal sprays as patient requests  Will review Download

## 2019-11-01 NOTE — Telephone Encounter (Signed)
Called and spoke to pt, who is requesting a 90 day supply of nasonex. Pt stated that he has been doing 2 sprays BID. Pt is aware that this a high dose and this medication was prescribed as 2 sprays daily.  Rx has been sent to preferred pharmacy. Pt also stated that he is have difficulty sleeping and requested that Dr. Mortimer Fries review his download.  Download has been placed in Dr. Zoila Shutter folder for review.  Dr. Mortimer Fries please advise. Thanks

## 2019-11-02 NOTE — Telephone Encounter (Signed)
compliance report has been reviewed by Dr. Mortimer Fries- continue auto cpap as prescribed. Sleep apnea well controlled.  Pt is aware of results and voiced her understanding.  Nothing further is needed.

## 2019-11-22 ENCOUNTER — Other Ambulatory Visit: Payer: Self-pay | Admitting: Internal Medicine

## 2019-11-22 DIAGNOSIS — J449 Chronic obstructive pulmonary disease, unspecified: Secondary | ICD-10-CM

## 2019-11-26 DIAGNOSIS — J449 Chronic obstructive pulmonary disease, unspecified: Secondary | ICD-10-CM

## 2019-11-27 MED ORDER — BUDESONIDE-FORMOTEROL FUMARATE 160-4.5 MCG/ACT IN AERO: 2.0000 | INHALATION_SPRAY | Freq: Two times a day (BID) | RESPIRATORY_TRACT | 2 refills | Status: DC

## 2020-01-02 ENCOUNTER — Ambulatory Visit (INDEPENDENT_AMBULATORY_CARE_PROVIDER_SITE_OTHER): Payer: Medicare Other | Admitting: Internal Medicine

## 2020-01-02 ENCOUNTER — Other Ambulatory Visit: Payer: Self-pay

## 2020-01-02 ENCOUNTER — Encounter: Payer: Self-pay | Admitting: Internal Medicine

## 2020-01-02 VITALS — BP 124/84 | HR 80 | Temp 97.5°F | Ht 69.0 in | Wt 201.0 lb

## 2020-01-02 DIAGNOSIS — J449 Chronic obstructive pulmonary disease, unspecified: Secondary | ICD-10-CM | POA: Diagnosis not present

## 2020-01-02 DIAGNOSIS — R0789 Other chest pain: Secondary | ICD-10-CM | POA: Diagnosis not present

## 2020-01-02 DIAGNOSIS — G4733 Obstructive sleep apnea (adult) (pediatric): Secondary | ICD-10-CM | POA: Diagnosis not present

## 2020-01-02 NOTE — Patient Instructions (Addendum)
Continue inhalers as prescribed Avoid secondhand smoke exposure  avoid sick contacts Continue auto CPAP  Check ECHO Cardiology referral for chest pain

## 2020-01-02 NOTE — Progress Notes (Signed)
[facility] Consultation      Date: 01/02/2020,   MRN# [id number] TALIS IWAN III 1956-01-13  Aurelio Brash III is a 64 y.o. old male seen in consultation for COPD   Synopsis estabished care in 2017 for Severe COPD PFT's 10/2015 Fev1 28% 1.03L  6MWT WNL 95% o2 sat at 1083 feet ONO +for hypoxia on oxygen  CT chest 3.15.19 Reviewed with patient +emphysema No suspicious nodules, massess No effusions No pneumonia  Previous compliance report  compliance report shared with patient AHI 0.1 100% compliance AUTOCPAP 7-18c hes  CHIEF COMPLAINT:  Follow up COPD Follow up OSA  HISTORY OF PRESENT ILLNESS    COPD seems stable FEV1 28% very severe struct of airways disease Stays active plays golf Diagnosis of COPD 1998 Previous smoker COPD seems to be stable on Symbicort and Spiriva   Patient has chronic shortness of breath and dyspnea exertion Patient with excellent compliance with CPAP therapy   12/2019 compliance report Excellent compliance report AHI down to 0.1    No exacerbation at this time No evidence of heart failure at this time No evidence or signs of infection at this time No respiratory distress No fevers, chills, nausea, vomiting, diarrhea No evidence of lower extremity edema No evidence hemoptysis  Patient with atypical chest pain described as twinge Needs cardiac echo and evaluation for cardiac ischemia      Current Medication:  Current Outpatient Medications:  .  budesonide-formoterol (SYMBICORT) 160-4.5 MCG/ACT inhaler, Inhale 2 puffs into the lungs 2 (two) times daily., Disp: 30.6 g, Rfl: 2 .  gabapentin (NEURONTIN) 300 MG capsule, Take 300 mg by mouth 3 (three) times daily., Disp: , Rfl:  .  levothyroxine (SYNTHROID, LEVOTHROID) 112 MCG tablet, Take 112 mcg by mouth daily before breakfast., Disp: , Rfl:  .  lidocaine (LIDODERM) 5 %, Place 1 patch onto the skin daily. Remove & Discard patch within 12 hours or as directed  by MD, Disp: 30 patch, Rfl: 0 .  methylPREDNISolone (MEDROL DOSEPAK) 4 MG TBPK tablet, Take as directed on package, Disp: 1 each, Rfl: 0 .  mometasone (NASONEX) 50 MCG/ACT nasal spray, Place 2 sprays into the nose in the morning and at bedtime., Disp: 51 g, Rfl: 0 .  oxybutynin (DITROPAN-XL) 5 MG 24 hr tablet, Take 5 mg by mouth daily., Disp: , Rfl:  .  polyethylene glycol (MIRALAX / GLYCOLAX) 17 g packet, Take 17 g by mouth daily as needed for mild constipation. (Patient not taking: Reported on 08/28/2019), Disp: 14 each, Rfl: 0 .  PROAIR HFA 108 (90 Base) MCG/ACT inhaler, INHALE 2 PUFFS BY MOUTH INTO THE LUNGS EVERY 4 HOURS AS NEEDED (Patient taking differently: Inhale 2 puffs into the lungs every 6 (six) hours as needed for wheezing. ), Disp: 25.5 g, Rfl: 2 .  rosuvastatin (CRESTOR) 10 MG tablet, Take 10 mg by mouth daily., Disp: , Rfl:  .  senna (SENOKOT) 8.6 MG TABS tablet, Take 1 tablet (8.6 mg total) by mouth 2 (two) times daily. (Patient not taking: Reported on 08/28/2019), Disp: 120 tablet, Rfl: 0 .  SPIRIVA RESPIMAT 2.5 MCG/ACT AERS, INHALE 2 PUFFS BY MOUTH INTO LUNGS DAILY (Patient taking differently: Inhale 1 puff into the lungs in the morning and at bedtime. ), Disp: 12 g, Rfl: 5 .  vitamin B-12 (CYANOCOBALAMIN) 1000 MCG tablet, Take 1,000 mcg by mouth daily., Disp: , Rfl:     ALLERGIES   Penicillins     REVIEW OF SYSTEMS  Review of Systems  Constitutional: Negative for chills, diaphoresis, fever, malaise/fatigue and weight loss.  HENT: Negative for congestion and hearing loss.   Respiratory: Positive for shortness of breath. Negative for cough, hemoptysis, sputum production and wheezing.   Cardiovascular: Negative for chest pain, palpitations, orthopnea and leg swelling.  Gastrointestinal: Negative for heartburn and nausea.  Skin: Negative for rash.  Neurological: Negative for weakness and headaches.  Psychiatric/Behavioral: Negative for depression.  All other systems  reviewed and are negative.    Physical Examination:   General Appearance: No distress  Neuro:without focal findings,  speech normal,  HEENT: PERRLA, EOM intact.   Pulmonary: normal breath sounds, No wheezing.  CardiovascularNormal S1,S2.  No m/r/g.   Abdomen: Benign, Soft, non-tender. Renal:  No costovertebral tenderness  GU:  Not performed at this time. Endoc: No evident thyromegaly Skin:   warm, no rashes, no ecchymosis  Extremities: normal, no cyanosis, clubbing. PSYCHIATRIC: Mood, affect within normal limits.   ALL OTHER ROS ARE NEGATIVE   CT chest 3.15.19 Emphysematous changes, pulmonary scarring and patchy areas of peribronchial thickening and minimal bronchiectasis. No suspicious nodules, masses   ASSESSMENT/PLAN    64 year old pleasant white male seen today for follow-up for very severe COPD Gold stage D Chronic respiratory insufficiency with chronic shortness of breath which seems to be stable at this time With underlying diagnosis of sleep apnea on CPAP therapy  Very severe COPD Gold stage D Continue Symbicort and Spiriva as prescribed Albuterol as needed No signs of exacerbation at this time  OSA Patient using and benefiting from therapy Continue auto CPAP 7-18 Excellent compliance report   Respiratory insufficiency with shortness of breath and dyspnea exertion Related to his COPD No indication for oxygen with exertion at this time We will consider repeating next minute walk test     COVID-19 EDUCATION: The signs and symptoms of COVID-19 were discussed with the patient and how to seek care for testing.  The importance of social distancing was discussed today. Hand Washing Techniques and avoid touching face was advised.     MEDICATION ADJUSTMENTS/LABS AND TESTS ORDERED: Continue AUTOCPAP AS PRESCRIBED Use inhalers as prescribed  Completed April 2021  CURRENT MEDICATIONS REVIEWED AT LENGTH WITH PATIENT TODAY   Patient satisfied with  Plan of action and management. All questions answered  Follow up in 6 months  Total time spent 37 minutes   [name], M.D.  [facility]  Medical Director [place] Director [facility] Cardio-Pulmonary Department

## 2020-01-21 DIAGNOSIS — G4733 Obstructive sleep apnea (adult) (pediatric): Secondary | ICD-10-CM

## 2020-01-23 ENCOUNTER — Telehealth: Payer: Self-pay | Admitting: Internal Medicine

## 2020-01-23 ENCOUNTER — Other Ambulatory Visit: Payer: Self-pay

## 2020-01-23 ENCOUNTER — Ambulatory Visit (INDEPENDENT_AMBULATORY_CARE_PROVIDER_SITE_OTHER): Payer: Medicare Other

## 2020-01-23 DIAGNOSIS — R0789 Other chest pain: Secondary | ICD-10-CM

## 2020-01-23 DIAGNOSIS — J449 Chronic obstructive pulmonary disease, unspecified: Secondary | ICD-10-CM

## 2020-01-23 LAB — ECHOCARDIOGRAM COMPLETE
AR max vel: 2.42 cm2
AV Area VTI: 1.99 cm2
AV Area mean vel: 2.07 cm2
AV Mean grad: 3 mmHg
AV Peak grad: 5 mmHg
Ao pk vel: 1.12 m/s
Area-P 1/2: 4.6 cm2
Calc EF: 41.6 %
S' Lateral: 3.2 cm
Single Plane A2C EF: 44.7 %
Single Plane A4C EF: 41.8 %

## 2020-01-23 NOTE — Telephone Encounter (Signed)
Dr. Mortimer Fries, is it okay to order nasal pillows?

## 2020-01-23 NOTE — Telephone Encounter (Signed)
Please 01/21/2020 mychart message.  Patient is aware that we are currently awaiting a response from Dr. Mortimer Fries.

## 2020-01-23 NOTE — Telephone Encounter (Signed)
Patient stated that he did not receive Rx for nebulizer medications, however he did receive nebulizer machine. I do not see mentioning of nebulizer medications in last office note. He is currently taking spiriva and symbicort.  He is also requesting a Rx for nasal pillows for cpap.   Dr. Delanna Ahmadi advise. Thanks

## 2020-01-23 NOTE — Telephone Encounter (Signed)
Pt states that he would like whatever meds is Rx'd he would like 90 day supply -

## 2020-01-24 ENCOUNTER — Encounter: Payer: Self-pay | Admitting: Cardiology

## 2020-01-24 ENCOUNTER — Ambulatory Visit (INDEPENDENT_AMBULATORY_CARE_PROVIDER_SITE_OTHER): Payer: Medicare Other | Admitting: Cardiology

## 2020-01-24 VITALS — BP 128/78 | HR 85 | Ht 69.0 in | Wt 203.0 lb

## 2020-01-24 DIAGNOSIS — I209 Angina pectoris, unspecified: Secondary | ICD-10-CM | POA: Diagnosis not present

## 2020-01-24 DIAGNOSIS — R0600 Dyspnea, unspecified: Secondary | ICD-10-CM

## 2020-01-24 DIAGNOSIS — E7800 Pure hypercholesterolemia, unspecified: Secondary | ICD-10-CM | POA: Diagnosis not present

## 2020-01-24 DIAGNOSIS — R0609 Other forms of dyspnea: Secondary | ICD-10-CM

## 2020-01-24 MED ORDER — METOPROLOL TARTRATE 100 MG PO TABS
ORAL_TABLET | ORAL | 0 refills | Status: DC
Start: 2020-01-24 — End: 2020-07-09

## 2020-01-24 MED ORDER — IPRATROPIUM-ALBUTEROL 0.5-2.5 (3) MG/3ML IN SOLN: 3.0000 mL | RESPIRATORY_TRACT | 3 refills | Status: DC | PRN

## 2020-01-24 NOTE — Patient Instructions (Signed)
Medication Instructions:  Your physician recommends that you continue on your current medications as directed. Please refer to the Current Medication list given to you today.   A one time dose of Metoprolol 100 mg tablet to be taken 2 hours prior to your Cardiac CT has been sent to your pharmacy.  *If you need a refill on your cardiac medications before your next appointment, please call your pharmacy*   Lab Work: Your physician recommends that you return for lab work 1 week prior to your Cardiac CT.  If you have labs (blood work) drawn today and your tests are completely normal, you will receive your results only by: Marland Kitchen MyChart Message (if you have MyChart) OR . A paper copy in the mail If you have any lab test that is abnormal or we need to change your treatment, we will call you to review the results.   Testing/Procedures: Your physician has requested that you have cardiac CT. Cardiac computed tomography (CT) is a painless test that uses an x-ray machine to take clear, detailed pictures of your heart. For further information please visit HugeFiesta.[email or web address]. Please follow instruction sheet as given.      Follow-Up: At [facility], you and your health needs are our priority.  As part of our continuing mission to provide you with exceptional heart care, we have created designated Provider Care Teams.  These Care Teams include your primary Cardiologist (physician) and Advanced Practice Providers (APPs -  Physician Assistants and Nurse Practitioners) who all work together to provide you with the care you need, when you need it.  We recommend signing up for the patient portal called "MyChart".  Sign up information is provided on this After Visit Summary.  MyChart is used to connect with patients for Virtual Visits (Telemedicine).  Patients are able to view lab/test results, encounter notes, upcoming appointments, etc.  Non-urgent messages can be sent to your provider as well.   To learn  more about what you can do with MyChart, go to [email or web address].    Your next appointment:   5 week(s)  The format for your next appointment:   In Person  Provider:   Kate Sable, MD   Other Instructions Your cardiac CT will be scheduled at one of the below locations:   [facility] [address] [place], [place] [address] [phone]  [place] [address] [place], [place] [address] [phone]  If scheduled at [facility], please arrive at the [facility] main entrance of [facility] 30 minutes prior to test start time. Proceed to the [facility] Radiology Department (first floor) to check-in and test prep.  If scheduled at [facility], please arrive 15 mins early for check-in and test prep.  Please follow these instructions carefully (unless otherwise directed):  Hold all erectile dysfunction medications at least 3 days (72 hrs) prior to test.  On the Night Before the Test: . Be sure to Drink plenty of water. . Do not consume any caffeinated/decaffeinated beverages or chocolate 12 hours prior to your test. . Do not take any antihistamines 12 hours prior to your test.  On the Day of the Test: . Drink plenty of water. Do not drink any water within one hour of the test. . Do not eat any food 4 hours prior to the test. . You may take your regular medications prior to the test.  . Take metoprolol (Lopressor) two hours prior  to test.       After the Test: . Drink plenty of water. . After receiving IV contrast, you may experience a mild flushed feeling. This is normal. . On occasion, you may experience a mild rash up to 24 hours after the test. This is not dangerous. If this occurs, you can take Benadryl 25 mg and increase your fluid intake. . If you experience trouble breathing, this can be serious. If it is severe call 911 IMMEDIATELY. If it is  mild, please call our office. . If you take any of these medications: Glipizide/Metformin, Avandament, Glucavance, please do not take 48 hours after completing test unless otherwise instructed.   Once we have confirmed authorization from your insurance company, we will call you to set up a date and time for your test. Based on how quickly your insurance processes prior authorizations requests, please allow up to 4 weeks to be contacted for scheduling your Cardiac CT appointment. Be advised that routine Cardiac CT appointments could be scheduled as many as 8 weeks after your provider has ordered it.  For non-scheduling related questions, please contact the cardiac imaging nurse navigator should you have any questions/concerns: Marchia Bond, Cardiac Imaging Nurse Navigator Burley Saver, Interim Cardiac Imaging Nurse [place] and Vascular Services Direct Office Dial: [phone]   For scheduling needs, including cancellations and rescheduling, please call Vivien Rota at [phone], option 3.

## 2020-01-24 NOTE — Progress Notes (Signed)
Cardiology Office Note:    Date:  01/24/2020   ID:  David [name], DOB 16-Jan-1956, MRN [id number]  PCP:  David Hector, MD  [facility] Cardiologist:  David Sable, MD  [place] Electrophysiologist:  None   Referring MD: David Hector, MD   Chief Complaint  Patient presents with  . other    Chest pain f/u echo. Meds reviewed verbally with pt.    History of Present Illness:    David [name] is a 64 y.o. male with a hx of hyperlipidemia, former smoker x15 years, COPD who presents due to shortness of breath.  Patient states symptoms of shortness of breath have worsened over the past year especially the past 2 to 3 months.  He is usually able to exercise on an elliptical for 30 minutes, but currently very makes 15 minutes and takes much longer for him to regain his normal breathing.  Denies chest pain or tightness per se, has a history of hyperlipidemia and stopped his Crestor 3 weeks ago.  Would like to check his lipid panel next month with his primary care provider off cholesterol meds.  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  Sees pulmonary medicine for COPD management.  Echo was ordered, performed yesterday showing normal systolic function, EF 50 to 31%, normal diastolic function.   Past Medical History:  Diagnosis Date  . B12 deficiency   . Complete tear of right rotator cuff   . Compression fracture of spine ([place])   . COPD (chronic obstructive pulmonary disease) ([place])   . DDD (degenerative disc disease), lumbar   . H/O emphysema   . Hyperlipidemia   . Hypothyroidism   . Lung nodule, multiple    3MM. AND 8 MM  . Nocturnal hypoxia   . Pneumonia   . Shortness of breath on exertion   . Sleep apnea   . Thrombocytopenia ([place])   . Thyroid disease     Past Surgical History:  Procedure Laterality Date  . ANTERIOR CERVICAL DECOMP/DISCECTOMY FUSION N/A 08/20/2019   Procedure:  ANTERIOR CERVICAL DECOMPRESSION/DISCECTOMY FUSION 2 LEVELS C4/5, C5/6;  Surgeon: David Perla, MD;  Location: [facility] ORS;  Service: Neurosurgery;  Laterality: N/A;  . BACK SURGERY  1991   L5-S1  . COLONOSCOPY    . ELBOW ARTHROSCOPY Right   . ESOPHAGOGASTRODUODENOSCOPY (EGD) WITH PROPOFOL N/A 09/26/2017   Procedure: ESOPHAGOGASTRODUODENOSCOPY (EGD) WITH PROPOFOL;  Surgeon: David Landsman, MD;  Location: [place];  Service: Gastroenterology;  Laterality: N/A;  . [place]  . KYPHOPLASTY N/A 06/10/2016   Procedure: KYPHOPLASTY T12;  Surgeon: David Knows, MD;  Location: [facility] ORS;  Service: Orthopedics;  Laterality: N/A;  . KYPHOPLASTY     T12  . SHOULDER ARTHROSCOPY    . TENODESIS LONG TENDON BICEPS Right   . TONSILLECTOMY    . WISDOM TOOTH EXTRACTION  1982    Current Medications: Current Meds  Medication Sig  . budesonide-formoterol (SYMBICORT) 160-4.5 MCG/ACT inhaler Inhale 2 puffs into the lungs 2 (two) times daily.  Marland Kitchen ipratropium-albuterol (DUONEB) 0.5-2.5 (3) MG/3ML SOLN Take 3 mLs by nebulization every 4 (four) hours as needed.  Marland Kitchen levothyroxine (SYNTHROID, LEVOTHROID) 112 MCG tablet Take 112 mcg by mouth daily before breakfast.  . oxybutynin (DITROPAN-XL) 5 MG 24 hr tablet Take 5 mg by mouth daily.  Marland Kitchen PROAIR HFA 108 (90 Base) MCG/ACT inhaler INHALE 2 PUFFS BY  MOUTH INTO THE LUNGS EVERY 4 HOURS AS NEEDED (Patient taking differently: Inhale 2 puffs into the lungs every 6 (six) hours as needed for wheezing. )  . SPIRIVA RESPIMAT 2.5 MCG/ACT AERS INHALE 2 PUFFS BY MOUTH INTO LUNGS DAILY (Patient taking differently: Inhale 1 puff into the lungs in the morning and at bedtime. )  . vitamin B-12 (CYANOCOBALAMIN) 1000 MCG tablet Take 1,000 mcg by mouth daily.  . Vitamin D, Cholecalciferol, 10 MCG (400 UNIT) TABS Take by mouth.     Allergies:   Penicillins   Social History   Socioeconomic History  . Marital status: Married    Spouse name: Not on file  . Number of  children: Not on file  . Years of education: Not on file  . Highest education level: Not on file  Occupational History  . Not on file  Tobacco Use  . Smoking status: Former Smoker    Quit date: 06/09/1984    Years since quitting: 35.6  . Smokeless tobacco: Never Used  Vaping Use  . Vaping Use: Never used  Substance and Sexual Activity  . Alcohol use: Yes    Alcohol/week: 14.0 standard drinks    Types: 14 Cans of beer per week    Comment: 1-2 beers daily  . Drug use: No  . Sexual activity: Not on file  Other Topics Concern  . Not on file  Social History Narrative  . Not on file   Social Determinants of Health   Financial Resource Strain:   . Difficulty of Paying Living Expenses: Not on file  Food Insecurity:   . Worried About Charity fundraiser in the Last Year: Not on file  . Ran Out of Food in the Last Year: Not on file  Transportation Needs:   . Lack of Transportation (Medical): Not on file  . Lack of Transportation (Non-Medical): Not on file  Physical Activity:   . Days of Exercise per Week: Not on file  . Minutes of Exercise per Session: Not on file  Stress:   . Feeling of Stress : Not on file  Social Connections:   . Frequency of Communication with Friends and Family: Not on file  . Frequency of Social Gatherings with Friends and Family: Not on file  . Attends Religious Services: Not on file  . Active Member of Clubs or Organizations: Not on file  . Attends Archivist Meetings: Not on file  . Marital Status: Not on file     Family History: The patient's family history includes Heart disease in his father.  ROS:   Please see the history of present illness.     All other systems reviewed and are negative.  EKGs/Labs/Other Studies Reviewed:    The following studies were reviewed today:   EKG:  EKG is  ordered today.  The ekg ordered today demonstrates normal sinus rhythm, normal ECG.  Recent Labs: 08/27/2019: ALT 24; BUN 17; Creatinine, Ser  1.00; Hemoglobin 13.3; Platelets 130; Potassium 4.1; Sodium 137  Recent Lipid Panel No results found for: CHOL, TRIG, HDL, CHOLHDL, VLDL, LDLCALC, LDLDIRECT   Risk Assessment/Calculations:      Physical Exam:    VS:  BP 128/78 (BP Location: Right Arm, Patient Position: Sitting, Cuff Size: Normal)   Pulse 85   Ht '5\' 9"'  (1.753 m)   Wt 203 lb (92.1 kg)   SpO2 97%   BMI 29.98 kg/m     Wt Readings from Last 3 Encounters:  01/24/20 203 lb (  92.1 kg)  01/02/20 201 lb (91.2 kg)  08/27/19 190 lb 0.6 oz (86.2 kg)     GEN:  Well nourished, well developed in no acute distress HEENT: Normal NECK: No JVD; No carotid bruits LYMPHATICS: No lymphadenopathy CARDIAC: RRR, no murmurs, rubs, gallops RESPIRATORY: Clear, decreased breath sounds at bases, no wheezing ABDOMEN: Soft, non-tender, non-distended MUSCULOSKELETAL:  No edema; No deformity  SKIN: Warm and dry NEUROLOGIC:  Alert and oriented x 3 PSYCHIATRIC:  Normal affect   ASSESSMENT:    1. Dyspnea on exertion   2. Pure hypercholesterolemia   3. Angina pectoris ([place])    PLAN:    In order of problems listed above:  1. Patient with worsening dyspnea on exertion, risk factors of hyperlipidemia, former smoker.  This could be an anginal equivalent.  Echo with normal systolic and diastolic function, EF 50 to 55%.  We will get a coronary CTA to evaluate any significant obstructive CAD. 2. History of hyperlipidemia, plan for repeat lipid panel in 2 weeks.  If lipid panel abnormal, recommend statin.  Continue low-cholesterol diet.  Follow-up after coronary CTA.  Total encounter time more 60 minutes  Greater than 50% was spent in counseling and coordination of care with the patient    Medication Adjustments/Labs and Tests Ordered: Current medicines are reviewed at length with the patient today.  Concerns regarding medicines are outlined above.  Orders Placed This Encounter  Procedures  . CT CORONARY MORPH W/CTA COR W/SCORE W/CA  W/CM &/OR WO/CM  . CT CORONARY FRACTIONAL FLOW RESERVE DATA PREP  . CT CORONARY FRACTIONAL FLOW RESERVE FLUID ANALYSIS  . Basic metabolic panel  . EKG 12-Lead   Meds ordered this encounter  Medications  . metoprolol tartrate (LOPRESSOR) 100 MG tablet    Sig: Take 1 tablet (100 mg) 2 hours prior to Cardiac CT    Dispense:  1 tablet    Refill:  0    Patient Instructions  Medication Instructions:  Your physician recommends that you continue on your current medications as directed. Please refer to the Current Medication list given to you today.   A one time dose of Metoprolol 100 mg tablet to be taken 2 hours prior to your Cardiac CT has been sent to your pharmacy.  *If you need a refill on your cardiac medications before your next appointment, please call your pharmacy*   Lab Work: Your physician recommends that you return for lab work 1 week prior to your Cardiac CT.  If you have labs (blood work) drawn today and your tests are completely normal, you will receive your results only by: Marland Kitchen MyChart Message (if you have MyChart) OR . A paper copy in the mail If you have any lab test that is abnormal or we need to change your treatment, we will call you to review the results.   Testing/Procedures: Your physician has requested that you have cardiac CT. Cardiac computed tomography (CT) is a painless test that uses an x-ray machine to take clear, detailed pictures of your heart. For further information please visit HugeFiesta.[email or web address]. Please follow instruction sheet as given.      Follow-Up: At [facility], you and your health needs are our priority.  As part of our continuing mission to provide you with exceptional heart care, we have created designated Provider Care Teams.  These Care Teams include your primary Cardiologist (physician) and Advanced Practice Providers (APPs -  Physician Assistants and Nurse Practitioners) who all work together to provide you with the care  you need,  when you need it.  We recommend signing up for the patient portal called "MyChart".  Sign up information is provided on this After Visit Summary.  MyChart is used to connect with patients for Virtual Visits (Telemedicine).  Patients are able to view lab/test results, encounter notes, upcoming appointments, etc.  Non-urgent messages can be sent to your provider as well.   To learn more about what you can do with MyChart, go to [email or web address].    Your next appointment:   5 week(s)  The format for your next appointment:   In Person  Provider:   Kate Sable, MD   Other Instructions Your cardiac CT will be scheduled at one of the below locations:   [facility] [address] [place], [place] [address] [phone]  [place] [address] [place], [place] [address] [phone]  If scheduled at [facility], please arrive at the [facility] main entrance of [facility] 30 minutes prior to test start time. Proceed to the [facility] Radiology Department (first floor) to check-in and test prep.  If scheduled at [facility], please arrive 15 mins early for check-in and test prep.  Please follow these instructions carefully (unless otherwise directed):  Hold all erectile dysfunction medications at least 3 days (72 hrs) prior to test.  On the Night Before the Test: . Be sure to Drink plenty of water. . Do not consume any caffeinated/decaffeinated beverages or chocolate 12 hours prior to your test. . Do not take any antihistamines 12 hours prior to your test.  On the Day of the Test: . Drink plenty of water. Do not drink any water within one hour of the test. . Do not eat any food 4 hours prior to the test. . You may take your regular medications prior to the test.  . Take metoprolol (Lopressor) two hours prior to test.       After the Test: . Drink plenty  of water. . After receiving IV contrast, you may experience a mild flushed feeling. This is normal. . On occasion, you may experience a mild rash up to 24 hours after the test. This is not dangerous. If this occurs, you can take Benadryl 25 mg and increase your fluid intake. . If you experience trouble breathing, this can be serious. If it is severe call 911 IMMEDIATELY. If it is mild, please call our office. . If you take any of these medications: Glipizide/Metformin, Avandament, Glucavance, please do not take 48 hours after completing test unless otherwise instructed.   Once we have confirmed authorization from your insurance company, we will call you to set up a date and time for your test. Based on how quickly your insurance processes prior authorizations requests, please allow up to 4 weeks to be contacted for scheduling your Cardiac CT appointment. Be advised that routine Cardiac CT appointments could be scheduled as many as 8 weeks after your provider has ordered it.  For non-scheduling related questions, please contact the cardiac imaging nurse navigator should you have any questions/concerns: Marchia Bond, Cardiac Imaging Nurse Navigator Burley Saver, Interim Cardiac Imaging Nurse [place] and Vascular Services Direct Office Dial: [phone]   For scheduling needs, including cancellations and rescheduling, please call Vivien Rota at [phone], option 3.        Signed, David Sable, MD  01/24/2020 12:54 PM    [place]

## 2020-01-29 ENCOUNTER — Telehealth (HOSPITAL_COMMUNITY): Payer: Self-pay | Admitting: *Deleted

## 2020-01-29 NOTE — Telephone Encounter (Signed)
Attempted to call patient regarding upcoming cardiac CT appointment. Left message on voicemail with name and callback number  Shamarra Warda Tai RN Navigator Cardiac Imaging [facility] [phone] Office [phone] Cell  

## 2020-01-29 NOTE — Telephone Encounter (Signed)

## 2020-01-30 ENCOUNTER — Ambulatory Visit
Admission: RE | Admit: 2020-01-30 | Discharge: 2020-01-30 | Disposition: A | Discharge status: Home / Self Care | Payer: Medicare Other | Source: Ambulatory Visit | Attending: Cardiology | Admitting: Cardiology

## 2020-01-30 ENCOUNTER — Other Ambulatory Visit: Payer: Self-pay

## 2020-01-30 DIAGNOSIS — R0600 Dyspnea, unspecified: Secondary | ICD-10-CM | POA: Diagnosis not present

## 2020-01-30 DIAGNOSIS — I209 Angina pectoris, unspecified: Secondary | ICD-10-CM | POA: Diagnosis not present

## 2020-01-30 DIAGNOSIS — R0609 Other forms of dyspnea: Secondary | ICD-10-CM

## 2020-01-30 LAB — POCT I-STAT CREATININE: Creatinine, Ser: 0.9 mg/dL (ref 0.61–1.24)

## 2020-01-30 MED ORDER — METOPROLOL TARTRATE 5 MG/5ML IV SOLN
10.0000 mg | Freq: Once | INTRAVENOUS | Status: AC
  Administered 2020-01-30: 10 mg via INTRAVENOUS

## 2020-01-30 MED ORDER — METOPROLOL TARTRATE 5 MG/5ML IV SOLN: 10.0000 mg | Freq: Once | INTRAVENOUS | Status: DC

## 2020-01-30 MED ORDER — NITROGLYCERIN 0.4 MG SL SUBL
0.8000 mg | SUBLINGUAL_TABLET | Freq: Once | SUBLINGUAL | Status: AC
  Administered 2020-01-30: 0.8 mg via SUBLINGUAL

## 2020-01-30 MED ORDER — IOHEXOL 350 MG/ML SOLN
100.0000 mL | Freq: Once | INTRAVENOUS | Status: AC | PRN
  Administered 2020-01-30: 85 mL via INTRAVENOUS

## 2020-01-30 NOTE — Progress Notes (Signed)
Patient tolerated procedure well. Ambulate w/o difficulty. Sitting in chair drinking water and coffee, and eating graham crackers provided. Encouraged to drink extra water today and reasoning explained. Verbalized understanding. All questions answered. ABC intact. No further needs. Discharge from procedure area w/o issues.

## 2020-02-08 MED ORDER — MONTELUKAST SODIUM 10 MG PO TABS: 10.0000 mg | ORAL_TABLET | Freq: Every day | ORAL | 2 refills | Status: DC

## 2020-02-08 NOTE — Telephone Encounter (Signed)
Dr. Kasa, please advise. Thanks °

## 2020-02-15 ENCOUNTER — Other Ambulatory Visit: Payer: Self-pay | Admitting: Internal Medicine

## 2020-02-15 ENCOUNTER — Other Ambulatory Visit (HOSPITAL_COMMUNITY): Payer: Self-pay | Admitting: Internal Medicine

## 2020-02-15 DIAGNOSIS — K7469 Other cirrhosis of liver: Secondary | ICD-10-CM

## 2020-02-15 NOTE — Telephone Encounter (Signed)
Dr. Mortimer Fries, please advise if you would like for patient to have six minute walk test

## 2020-02-21 ENCOUNTER — Ambulatory Visit
Admission: RE | Admit: 2020-02-21 | Discharge: 2020-02-21 | Disposition: A | Discharge status: Home / Self Care | Payer: Medicare Other | Source: Ambulatory Visit | Attending: Internal Medicine | Admitting: Internal Medicine

## 2020-02-21 ENCOUNTER — Other Ambulatory Visit: Payer: Self-pay

## 2020-02-21 DIAGNOSIS — K7469 Other cirrhosis of liver: Secondary | ICD-10-CM | POA: Insufficient documentation

## 2020-03-10 ENCOUNTER — Ambulatory Visit: Payer: BLUE CROSS/BLUE SHIELD | Admitting: Cardiology

## 2020-04-28 ENCOUNTER — Other Ambulatory Visit: Payer: Self-pay | Admitting: Internal Medicine

## 2020-04-29 ENCOUNTER — Other Ambulatory Visit: Payer: Self-pay

## 2020-04-29 DIAGNOSIS — J449 Chronic obstructive pulmonary disease, unspecified: Secondary | ICD-10-CM

## 2020-04-29 MED ORDER — SPIRIVA RESPIMAT 2.5 MCG/ACT IN AERS
2.0000 | INHALATION_SPRAY | Freq: Every day | RESPIRATORY_TRACT | 1 refills | Status: DC
Start: 2020-04-29 — End: 2020-08-20

## 2020-04-29 MED ORDER — ALBUTEROL SULFATE HFA 108 (90 BASE) MCG/ACT IN AERS: 2.0000 | INHALATION_SPRAY | Freq: Four times a day (QID) | RESPIRATORY_TRACT | 1 refills | Status: DC | PRN

## 2020-04-29 NOTE — Progress Notes (Signed)
Rx for Spiriva 2.5 and proair has been sent to [facility] per pharmacy request.

## 2020-05-28 ENCOUNTER — Telehealth: Payer: Self-pay | Admitting: Internal Medicine

## 2020-05-28 NOTE — Telephone Encounter (Signed)
6 minute walk needs to be scheduled before the recall can be scheduled./mm

## 2020-05-28 NOTE — Telephone Encounter (Signed)
Lm to offer SMW for 06/02/2020 at 3:00.

## 2020-05-29 NOTE — Telephone Encounter (Signed)
SMW has been scheduled for 06/02/2020 at 3:00.

## 2020-06-02 ENCOUNTER — Ambulatory Visit: Payer: Medicare Other

## 2020-07-09 ENCOUNTER — Encounter: Payer: Self-pay | Admitting: Internal Medicine

## 2020-07-09 ENCOUNTER — Other Ambulatory Visit: Payer: Self-pay

## 2020-07-09 ENCOUNTER — Ambulatory Visit (INDEPENDENT_AMBULATORY_CARE_PROVIDER_SITE_OTHER): Payer: Medicare Other | Admitting: Internal Medicine

## 2020-07-09 VITALS — BP 132/80 | HR 83 | Temp 97.3°F | Ht 68.0 in | Wt 202.4 lb

## 2020-07-09 DIAGNOSIS — R0689 Other abnormalities of breathing: Secondary | ICD-10-CM

## 2020-07-09 DIAGNOSIS — J449 Chronic obstructive pulmonary disease, unspecified: Secondary | ICD-10-CM

## 2020-07-09 DIAGNOSIS — G4733 Obstructive sleep apnea (adult) (pediatric): Secondary | ICD-10-CM | POA: Diagnosis not present

## 2020-07-09 DIAGNOSIS — Z87891 Personal history of nicotine dependence: Secondary | ICD-10-CM

## 2020-07-09 MED ORDER — PREDNISONE 20 MG PO TABS: 10.0000 mg | ORAL_TABLET | Freq: Every day | ORAL | 1 refills | Status: DC

## 2020-07-09 MED ORDER — TRELEGY ELLIPTA 200-62.5-25 MCG/INH IN AEPB: 200.0000 ug | INHALATION_SPRAY | Freq: Every day | RESPIRATORY_TRACT | 0 refills | Status: DC

## 2020-07-09 MED ORDER — TRELEGY ELLIPTA 200-62.5-25 MCG/INH IN AEPB: 1.0000 | INHALATION_SPRAY | RESPIRATORY_TRACT | 3 refills | Status: DC

## 2020-07-09 NOTE — Progress Notes (Signed)
David [name]      Date: 07/09/2020,   MRN# [id number] David [name] Nov 05, 1955  David [name] is a 65 y.o. old male seen in [name] for COPD   Synopsis estabished care in 2017 for Severe COPD PFT's 10/2015 Fev1 28% 1.03L  6MWT WNL 95% o2 sat at 1083 feet ONO +for hypoxia on oxygen  CT chest 3.15.19 Reviewed with patient +emphysema No suspicious nodules, massess No effusions No pneumonia  Previous compliance report  compliance report shared with patient AHI 0.1 100% compliance AUTOCPAP 7-18c hes  CHIEF COMPLAINT:  Follow up OSA Follow up COPD    HISTORY OF PRESENT ILLNESS   COPD does not seem stable at this time Recent COPD exacerbation March 1st week Given ABX and prednisone   FEV1 28% very severe obstructive airways disease Stays active plays golf Diagnosed COPD 1998  Previous smoker Stable on Symbicort and Spiriva  Chronic shortness of breath chronic dyspnea exertion Seems to be worsening and impeding OSA therapy +dry mouth   **12/2019 compliance report Excellent compliance report AHI down to 0.1  **07/09/2020 Excellent compliance report 100% for days Greater than 97% percent for over 4 hours Auto CPAP 7 to 18 cm water pressure AHI 0.1    +exacerbation at this time Responded well to prednisone therapy would like more No evidence of heart failure at this time Minimal respiratory distress No fevers, chills, nausea, vomiting, diarrhea No evidence of lower extremity edema No evidence hemoptysis      Current Medication:  Current Outpatient Medications:  .  albuterol (PROAIR HFA) 108 (90 Base) MCG/ACT inhaler, Inhale 2 puffs into the lungs every 6 (six) hours as needed for wheezing., Disp: 54 g, Rfl: 1 .  budesonide-formoterol (SYMBICORT) 160-4.5 MCG/ACT inhaler, Inhale 2 puffs into the lungs 2 (two) times daily., Disp: 30.6 g, Rfl: 2 .  ipratropium-albuterol (DUONEB) 0.5-2.5 (3) MG/3ML SOLN, Take 3 mLs  by nebulization every 4 (four) hours as needed., Disp: 360 mL, Rfl: 3 .  levothyroxine (SYNTHROID, LEVOTHROID) 112 MCG tablet, Take 112 mcg by mouth daily before breakfast., Disp: , Rfl:  .  methylPREDNISolone (MEDROL DOSEPAK) 4 MG TBPK tablet, Take as directed on package, Disp: 1 each, Rfl: 0 .  metoprolol tartrate (LOPRESSOR) 100 MG tablet, Take 1 tablet (100 mg) 2 hours prior to Cardiac CT, Disp: 1 tablet, Rfl: 0 .  montelukast (SINGULAIR) 10 MG tablet, TAKE 1 TABLET(10 MG) BY MOUTH DAILY, Disp: 30 tablet, Rfl: 2 .  oxybutynin (DITROPAN-XL) 5 MG 24 hr tablet, Take 5 mg by mouth daily., Disp: , Rfl:  .  rosuvastatin (CRESTOR) 10 MG tablet, Take 10 mg by mouth daily. (Patient not taking: Reported on 01/24/2020), Disp: , Rfl:  .  Tiotropium Bromide Monohydrate (SPIRIVA RESPIMAT) 2.5 MCG/ACT AERS, Inhale 2 puffs into the lungs daily., Disp: 12 g, Rfl: 1 .  vitamin B-12 (CYANOCOBALAMIN) 1000 MCG tablet, Take 1,000 mcg by mouth daily., Disp: , Rfl:  .  Vitamin D, Cholecalciferol, 10 MCG (400 UNIT) TABS, Take by mouth., Disp: , Rfl:     ALLERGIES   Penicillins     REVIEW OF SYSTEMS   Review of Systems  Constitutional: Negative for chills, diaphoresis, fever, malaise/fatigue and weight loss.  HENT: Negative for congestion and hearing loss.   Respiratory: Positive for shortness of breath. Negative for cough, hemoptysis, sputum production and wheezing.   Cardiovascular: Negative for chest pain, palpitations, orthopnea and leg swelling.  Gastrointestinal: Negative for heartburn and nausea.  Skin: Negative for rash.  Neurological: Negative for weakness and headaches.  Psychiatric/Behavioral: Negative for depression.  All other systems reviewed and are negative.  BP 132/80 (BP Location: Left Arm, Patient Position: Sitting, Cuff Size: Normal)   Pulse 83   Temp (!) 97.3 F (36.3 C) (Temporal)   Ht 5\' 8"  (1.727 m)   Wt 202 lb 6.4 oz (91.8 kg)   SpO2 96%   BMI 30.77 kg/m    Physical  Examination:   General Appearance: No distress  Neuro:without focal findings,  speech normal,  HEENT: PERRLA, EOM intact.   Pulmonary: normal breath sounds, +wheezing.  CardiovascularNormal S1,S2.  No m/r/g.   Abdomen: Benign, Soft, non-tender. Renal:  No costovertebral tenderness  GU:  Not performed at this time. Endoc: No evident thyromegaly Skin:   warm, no rashes, no ecchymosis  Extremities: normal, no cyanosis, clubbing. PSYCHIATRIC: Mood, affect within normal limits.   ALL OTHER ROS ARE NEGATIVE   CT chest 3.15.19 Emphysematous changes, pulmonary scarring and patchy areas of peribronchial thickening and minimal bronchiectasis. No suspicious nodules, masses   ASSESSMENT/PLAN   65 year old pleasant white male seen today for follow-up severe COPD Gold stage D with chronic respiratory insufficiency with chronic shortness of breath which seems to be stable at this time with underlying diagnosis of sleep apnea   Severe COPD Gold stage D mild COPD exacerbation Prednisone has helped in the past and patient would like more We will try Trelegy therapy  200 and assess respiratory status Albuterol as needed Prednisone 10 mg daily for 14 days  OSA Patient using and benefiting from CPAP therapy Continue auto CPAP 7-18 Excellent report Call company to assess humidity  Respiratory insufficiency with shortness of breath and dyspnea exertion Related to COPD No indication for oxygen at this time Check [place] and 6-minute walk test    COVID-19 EDUCATION: The signs and symptoms of COVID-19 were discussed with the patient. Importance of hand Hygiene and Social Distancing   MEDICATION ADJUSTMENTS/LABS AND TESTS ORDERED:  Continue CPAP as prescribed LUNG CANCER SCREENING REFERRAL PROGRAM PREDNISONE 10 mg daily fro 14 days Change to TRELEGY 200    CURRENT MEDICATIONS REVIEWED AT LENGTH WITH PATIENT TODAY   Patient satisfied with Plan of action and management. All questions  answered  Follow up 6 months  Total time spent 38 mins  [name], M.D.  [facility]  Medical Director [place] Director [facility] Cardio-Pulmonary Department

## 2020-07-09 NOTE — Patient Instructions (Addendum)
Continue CPAP as prescribed EXCELLENT JOB!! A+  Will change to TRELEGY 200  LUNG CANCER SCREENING REFERRAL  Check ONO while on CPAP Check 6MWT Prednisone 10 mg daily for 14 days

## 2020-07-10 ENCOUNTER — Telehealth: Payer: Self-pay | Admitting: *Deleted

## 2020-07-10 NOTE — Telephone Encounter (Signed)
Received referral for initial lung cancer screening scan. Contacted patient and obtained smoking history,(former smoker, quit in 1986). Explained to patient that the lung screening program is for current or former smoker who quit within the last 15 years. Will talk to [facility] tomorrow and call patient back.

## 2020-07-11 NOTE — Telephone Encounter (Signed)
Called patient back and told him that I spoke with Burgess Estelle who confirmed that the Lung Screening Program is for patients that are current or former smokers that have quit in the last 15 years. Because his quit date was 1986, he will not be eligible for this program. I will forward this message to Dr. Mortimer Fries as well. Patient verbalized understanding and thanked me for the return call.

## 2020-07-23 ENCOUNTER — Other Ambulatory Visit: Payer: Self-pay

## 2020-07-23 ENCOUNTER — Emergency Department: Payer: Medicare Other

## 2020-07-23 ENCOUNTER — Emergency Department
Admission: EM | Admit: 2020-07-23 | Discharge: 2020-07-24 | Disposition: A | Discharge status: Home / Self Care | Payer: Medicare Other | Attending: Emergency Medicine | Admitting: Emergency Medicine

## 2020-07-23 DIAGNOSIS — E039 Hypothyroidism, unspecified: Secondary | ICD-10-CM | POA: Diagnosis not present

## 2020-07-23 DIAGNOSIS — Z7951 Long term (current) use of inhaled steroids: Secondary | ICD-10-CM | POA: Insufficient documentation

## 2020-07-23 DIAGNOSIS — R1084 Generalized abdominal pain: Secondary | ICD-10-CM

## 2020-07-23 DIAGNOSIS — K5900 Constipation, unspecified: Secondary | ICD-10-CM | POA: Diagnosis not present

## 2020-07-23 DIAGNOSIS — M549 Dorsalgia, unspecified: Secondary | ICD-10-CM | POA: Diagnosis not present

## 2020-07-23 DIAGNOSIS — J449 Chronic obstructive pulmonary disease, unspecified: Secondary | ICD-10-CM | POA: Insufficient documentation

## 2020-07-23 DIAGNOSIS — Z79899 Other long term (current) drug therapy: Secondary | ICD-10-CM | POA: Diagnosis not present

## 2020-07-23 DIAGNOSIS — R109 Unspecified abdominal pain: Secondary | ICD-10-CM | POA: Diagnosis present

## 2020-07-23 DIAGNOSIS — Z87891 Personal history of nicotine dependence: Secondary | ICD-10-CM | POA: Diagnosis not present

## 2020-07-23 DIAGNOSIS — B029 Zoster without complications: Secondary | ICD-10-CM | POA: Diagnosis not present

## 2020-07-23 LAB — CBC
HCT: 42.7 % (ref 39.0–52.0)
Hemoglobin: 14.3 g/dL (ref 13.0–17.0)
MCH: 33.5 pg (ref 26.0–34.0)
MCHC: 33.5 g/dL (ref 30.0–36.0)
MCV: 100 fL (ref 80.0–100.0)
Platelets: 124 10*3/uL — ABNORMAL LOW (ref 150–400)
RBC: 4.27 MIL/uL (ref 4.22–5.81)
RDW: 13.2 % (ref 11.5–15.5)
WBC: 7 10*3/uL (ref 4.0–10.5)
nRBC: 0 % (ref 0.0–0.2)

## 2020-07-23 LAB — COMPREHENSIVE METABOLIC PANEL
ALT: 33 U/L (ref 0–44)
AST: 24 U/L (ref 15–41)
Albumin: 4.3 g/dL (ref 3.5–5.0)
Alkaline Phosphatase: 40 U/L (ref 38–126)
Anion gap: 9 (ref 5–15)
BUN: 13 mg/dL (ref 8–23)
CO2: 27 mmol/L (ref 22–32)
Calcium: 9 mg/dL (ref 8.9–10.3)
Chloride: 101 mmol/L (ref 98–111)
Creatinine, Ser: 1.02 mg/dL (ref 0.61–1.24)
GFR, Estimated: >60 mL/min (ref >60)
Glucose, Bld: 109 mg/dL — ABNORMAL HIGH (ref 70–99)
Potassium: 4 mmol/L (ref 3.5–5.1)
Sodium: 137 mmol/L (ref 135–145)
Total Bilirubin: 0.7 mg/dL (ref 0.3–1.2)
Total Protein: 7.2 g/dL (ref 6.5–8.1)

## 2020-07-23 LAB — LIPASE, BLOOD: Lipase: 31 U/L (ref 11–51)

## 2020-07-23 MED ORDER — IOHEXOL 300 MG/ML  SOLN
100.0000 mL | Freq: Once | INTRAMUSCULAR | Status: AC | PRN
  Administered 2020-07-23: 100 mL via INTRAVENOUS

## 2020-07-23 MED ORDER — HYDROMORPHONE HCL 1 MG/ML IJ SOLN
1.0000 mg | INTRAMUSCULAR | Status: AC
Start: 2020-07-23 — End: 2020-07-24
  Administered 2020-07-24: 1 mg via INTRAVENOUS
  Filled 2020-07-23: qty 1

## 2020-07-23 MED ORDER — MORPHINE SULFATE (PF) 4 MG/ML IV SOLN
4.0000 mg | Freq: Once | INTRAVENOUS | Status: AC
  Administered 2020-07-23: 4 mg via INTRAVENOUS
  Filled 2020-07-23: qty 1

## 2020-07-23 MED ORDER — ONDANSETRON HCL 4 MG/2ML IJ SOLN
4.0000 mg | Freq: Once | INTRAMUSCULAR | Status: AC
  Administered 2020-07-23: 4 mg via INTRAVENOUS
  Filled 2020-07-23: qty 2

## 2020-07-23 NOTE — ED Notes (Signed)
Patient transported to CT 
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
## 2020-07-23 NOTE — ED Triage Notes (Signed)
Pt states for the past 6 hours he has had worsening abd and back pain. Pt was seen by PCP on Monday and told he possibly has shingles. Pt also c/o dry mouth. Pt states he is nauseous, tried doing an enema at home with no relief.

## 2020-07-24 ENCOUNTER — Other Ambulatory Visit: Payer: Self-pay

## 2020-07-24 ENCOUNTER — Emergency Department
Admission: EM | Admit: 2020-07-24 | Discharge: 2020-07-24 | Disposition: A | Discharge status: Home / Self Care | Payer: Medicare Other | Source: Home / Self Care | Attending: Physician Assistant | Admitting: Physician Assistant

## 2020-07-24 DIAGNOSIS — R21 Rash and other nonspecific skin eruption: Secondary | ICD-10-CM

## 2020-07-24 DIAGNOSIS — B029 Zoster without complications: Secondary | ICD-10-CM | POA: Insufficient documentation

## 2020-07-24 DIAGNOSIS — Z79899 Other long term (current) drug therapy: Secondary | ICD-10-CM | POA: Insufficient documentation

## 2020-07-24 DIAGNOSIS — E039 Hypothyroidism, unspecified: Secondary | ICD-10-CM | POA: Insufficient documentation

## 2020-07-24 DIAGNOSIS — J449 Chronic obstructive pulmonary disease, unspecified: Secondary | ICD-10-CM | POA: Insufficient documentation

## 2020-07-24 DIAGNOSIS — K5900 Constipation, unspecified: Secondary | ICD-10-CM | POA: Diagnosis not present

## 2020-07-24 DIAGNOSIS — Z87891 Personal history of nicotine dependence: Secondary | ICD-10-CM | POA: Insufficient documentation

## 2020-07-24 MED ORDER — OXYCODONE-ACETAMINOPHEN 7.5-325 MG PO TABS: 1.0000 | ORAL_TABLET | Freq: Four times a day (QID) | ORAL | 0 refills | Status: AC | PRN

## 2020-07-24 MED ORDER — LACTULOSE 10 GM/15ML PO SOLN
20.0000 g | Freq: Once | ORAL | Status: AC
  Administered 2020-07-24: 20 g via ORAL
  Filled 2020-07-24: qty 30

## 2020-07-24 MED ORDER — VALACYCLOVIR HCL 500 MG PO TABS: 500.0000 mg | ORAL_TABLET | Freq: Three times a day (TID) | ORAL | 0 refills | Status: AC

## 2020-07-24 MED ORDER — LIDOCAINE 5 % EX PTCH: 1.0000 | MEDICATED_PATCH | Freq: Two times a day (BID) | CUTANEOUS | 0 refills | Status: DC

## 2020-07-24 MED ORDER — LIDOCAINE 5 % EX PTCH
1.0000 | MEDICATED_PATCH | CUTANEOUS | Status: DC
  Administered 2020-07-24: 1 via TRANSDERMAL
  Filled 2020-07-24: qty 1

## 2020-07-24 NOTE — ED Notes (Signed)
This RN to bedside to discharge pt with David [name], pt leaning on stretcher with knees and feet on floor. Pt states he placed himself on the floor because it is more comfortable.  Explained d/c instructions to pt, denies questions or concerns, ambulatory with steady gait

## 2020-07-24 NOTE — ED Triage Notes (Addendum)
Pt to ED POV for pain to right side and back. States recently dx with shingles and was seen in ED last night, prescribed nausea meds.  Pt holding right side and rocking back and forth.  To red papules noted to right side of back and left groin area  Pt continuously standing up and leaning on bed, trying to sit on floor and rock, pt asked not to sit on the floor and sit on the bed and able to do so.
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
## 2020-07-24 NOTE — Discharge Instructions (Signed)
Follow discharge care instruction.  Take medication as directed and use Lidoderm patches as directed.  Continue previous medications.

## 2020-07-24 NOTE — ED Notes (Signed)
Pt sleeping. 

## 2020-07-24 NOTE — Discharge Instructions (Signed)
You have been seen in the Emergency Department (ED) for abdominal pain and back pain.  Your evaluation did not identify a clear cause of your symptoms but was generally reassuring.  Please follow up as instructed above regarding today's emergent visit and the symptoms that are bothering you.  Return to the ED if your abdominal pain worsens or fails to improve, you develop bloody vomiting, bloody diarrhea, you are unable to tolerate fluids due to vomiting, fever greater than 101, or other symptoms that concern you.

## 2020-07-28 NOTE — Telephone Encounter (Signed)
Per last note, patient was supposed to have a ONO done. The order was placed and faxed to [facility] on 07/09/20. Called patient to get him scheduled for 40mw but he did not answer. Left message for him to call back to get scheduled.

## 2020-08-18 ENCOUNTER — Ambulatory Visit: Payer: Medicare Other

## 2020-08-20 ENCOUNTER — Encounter: Payer: Self-pay | Admitting: Primary Care

## 2020-08-20 ENCOUNTER — Ambulatory Visit (INDEPENDENT_AMBULATORY_CARE_PROVIDER_SITE_OTHER): Payer: Medicare Other | Admitting: Primary Care

## 2020-08-20 ENCOUNTER — Ambulatory Visit (INDEPENDENT_AMBULATORY_CARE_PROVIDER_SITE_OTHER): Payer: Medicare Other

## 2020-08-20 ENCOUNTER — Other Ambulatory Visit: Payer: Self-pay

## 2020-08-20 VITALS — BP 132/80 | HR 96 | Temp 98.7°F | Ht 69.0 in | Wt 199.2 lb

## 2020-08-20 DIAGNOSIS — J449 Chronic obstructive pulmonary disease, unspecified: Secondary | ICD-10-CM | POA: Diagnosis not present

## 2020-08-20 DIAGNOSIS — R0600 Dyspnea, unspecified: Secondary | ICD-10-CM | POA: Diagnosis not present

## 2020-08-20 DIAGNOSIS — R0609 Other forms of dyspnea: Secondary | ICD-10-CM

## 2020-08-20 MED ORDER — BREZTRI AEROSPHERE 160-9-4.8 MCG/ACT IN AERO: 2.0000 | INHALATION_SPRAY | Freq: Two times a day (BID) | RESPIRATORY_TRACT | 0 refills | Status: DC

## 2020-08-20 NOTE — Patient Instructions (Addendum)
Recommendations: - Stop Trelegy  - Start Breztri - take two puffs morning and evening (rinse mouth after use) - Aim to get 20-20 mins of cardiovascular exercise 4-5 times a week - Work on weight loss effort, goal 175-185lbs  - We may consider sending you to pulmonary rehab and/or refer you back to cardiology if no improvement in dyspnea symptoms   Follow-up: Televisit in 4 weeks with Eustaquio Maize NP

## 2020-08-20 NOTE — Progress Notes (Signed)
@Patient  ID: David [name], male    DOB: 10/14/1955, 65 y.o.   MRN: [id number]  Chief Complaint  Patient presents with  . Follow-up    Sob getting worse.    Referring provider: Adin Hector, MD  HPI: 65 year old male, former smoker quit 1986.  History significant for COPD, nocturnal hypoxia, dysphagia, fibrosis of liver, hypothyroidism, B12 deficiency, hyperlipidemia, thrombocytopenia.  Patient of Dr. Mortimer Fries last seen in office on 07/09/2020.  PFTs in 2017 showed FEV1 28%.  Over no positive for hypoxia on oxygen.  CT chest in 2019 showed emphysema.  No suspicious nodules or masses.  08/20/2020 Patient presents today for 6-minute walk test.  He reports shortness of breath has been worse over the last 5 months.  He is currently on Trelegy 200 daily, initial he noticed a significant improvement in his breathing when he first started maintenance inhaler.  Patient underwent 6-minute walk test today which showed no evidence of oxygen desaturations.  Patient had symptoms of dyspnea on exertion.  He had no problems with his breathing before covid-19 pandemic. He has never tested positive covid that he is aware of. His activity level did decrease over the last 2 years during this period of the pandemic. He was not getting regular exercise or golfing. States that he can walk "forever" downhill or level. He mainly experience dyspnea with inclines.  His weight has remained stable around 195-196lbs. He would like to lose 15-20 lbs, goal weight is between 175-185lb. He saw cardiology in October 2021, Cardiac CT calcium score was 0. Denies chest tightness, wheezing or cough.    Cardiac testing 01/23/2020 echocardiogram-EF 50 to 55%, left ventricular diastolic parameters were normal.  Normal pulmonary artery systolic pressure.  No aortic stenosis is present.  Cardiac CT 02/04/2020 showed aortic arthrosclerosis. Calcium score 0. Emphysema changes noted in lungs.  Pulmonary testing:   PFTs in 2017  showed FEV1 1.03L (28%)   Allergies  Allergen Reactions  . Penicillins Other (See Comments)    Unknown/ Childhood    Immunization History  Administered Date(s) Administered  . Influenza Inj Mdck Quad Pf 02/15/2020  . Influenza Split 01/09/2018  . Influenza,inj,Quad PF,6+ Mos 01/21/2017, 12/18/2018  . Influenza-Unspecified 12/26/2015  . PFIZER Comirnaty(Gray Top)Covid-19 Tri-Sucrose Vaccine 03/11/2020  . PFIZER(Purple Top)SARS-COV-2 Vaccination 06/24/2019, 07/15/2019  . Pneumococcal Polysaccharide-23 12/15/2016  . Td,absorbed, Preservative Free, Adult Use, Lf Unspecified 08/26/2016  . Zoster Recombinat (Shingrix) 02/09/2017, 05/04/2017    Past Medical History:  Diagnosis Date  . B12 deficiency   . Complete tear of right rotator cuff   . Compression fracture of spine ([place])   . COPD (chronic obstructive pulmonary disease) ([place])   . DDD (degenerative disc disease), lumbar   . H/O emphysema   . Hyperlipidemia   . Hypothyroidism   . Lung nodule, multiple    3MM. AND 8 MM  . Nocturnal hypoxia   . Pneumonia   . Shortness of breath on exertion   . Sleep apnea   . Thrombocytopenia ([place])   . Thyroid disease     Tobacco History: Social History   Tobacco Use  Smoking Status Former Smoker  . Packs/day: 1.00  . Quit date: 06/09/1984  . Years since quitting: 36.2  Smokeless Tobacco Never Used   Counseling given: Not Answered   Outpatient Medications Prior to Visit  Medication Sig Dispense Refill  . albuterol (PROAIR HFA) 108 (90 Base) MCG/ACT inhaler Inhale 2 puffs into the lungs every 6 (six) hours as needed for  wheezing. 54 g 1  . ipratropium-albuterol (DUONEB) 0.5-2.5 (3) MG/3ML SOLN Take 3 mLs by nebulization every 4 (four) hours as needed. 360 mL 3  . levothyroxine (SYNTHROID, LEVOTHROID) 112 MCG tablet Take 112 mcg by mouth daily before breakfast.    . lidocaine (LIDODERM) 5 % Place 1 patch onto the skin every 12 (twelve) hours. And apply one half patch to each area of  concern. 10 patch 0  . methylPREDNISolone (MEDROL DOSEPAK) 4 MG TBPK tablet Take as directed on package 1 each 0  . montelukast (SINGULAIR) 10 MG tablet TAKE 1 TABLET(10 MG) BY MOUTH DAILY 30 tablet 2  . oxybutynin (DITROPAN-XL) 5 MG 24 hr tablet Take 5 mg by mouth daily.    . rosuvastatin (CRESTOR) 10 MG tablet Take 10 mg by mouth daily.    . vitamin B-12 (CYANOCOBALAMIN) 1000 MCG tablet Take 1,000 mcg by mouth daily.    . Vitamin D, Cholecalciferol, 10 MCG (400 UNIT) TABS Take by mouth.    . Fluticasone-Umeclidin-Vilant (TRELEGY ELLIPTA) 200-62.5-25 MCG/INH AEPB Inhale 200 mcg into the lungs daily. 18 each 0  . predniSONE (DELTASONE) 20 MG tablet Take 0.5 tablets (10 mg total) by mouth daily with breakfast. 10 days 14 tablet 1  . budesonide-formoterol (SYMBICORT) 160-4.5 MCG/ACT inhaler Inhale 2 puffs into the lungs 2 (two) times daily. 30.6 g 2  . Tiotropium Bromide Monohydrate (SPIRIVA RESPIMAT) 2.5 MCG/ACT AERS Inhale 2 puffs into the lungs daily. 12 g 1   No facility-administered medications prior to visit.    Review of Systems  Review of Systems  Constitutional: Negative.   HENT: Negative.   Respiratory: Positive for shortness of breath. Negative for cough, chest tightness and wheezing.        Exertional dyspnea   Cardiovascular: Negative.     Physical Exam  BP 132/80 (BP Location: Left Arm, Cuff Size: Normal)   Pulse 96   Temp 98.7 F (37.1 C) (Temporal)   Ht 5\' 9"  (1.753 m)   Wt 199 lb 3.2 oz (90.4 kg)   SpO2 96%   BMI 29.42 kg/m  Physical Exam Constitutional:      Appearance: Normal appearance.  HENT:     Mouth/Throat:     Mouth: Mucous membranes are moist.     Pharynx: Oropharynx is clear.  Cardiovascular:     Rate and Rhythm: Normal rate and regular rhythm.  Pulmonary:     Effort: Pulmonary effort is normal.     Breath sounds: Normal breath sounds. No wheezing or rhonchi.  Musculoskeletal:        General: Normal range of motion.  Skin:    General: Skin  is warm and dry.  Neurological:     General: No focal deficit present.     Mental Status: He is alert and oriented to person, place, and time. Mental status is at baseline.  Psychiatric:        Mood and Affect: Mood normal.        Behavior: Behavior normal.        Thought Content: Thought content normal.        Judgment: Judgment normal.      Lab Results:  CBC    Component Value Date/Time   WBC 7.0 07/23/2020 2207   RBC 4.27 07/23/2020 2207   HGB 14.3 07/23/2020 2207   HCT 42.7 07/23/2020 2207   PLT 124 (L) 07/23/2020 2207   MCV 100.0 07/23/2020 2207   MCH 33.5 07/23/2020 2207   MCHC 33.5 07/23/2020 2207  RDW 13.2 07/23/2020 2207   LYMPHSABS 1.8 08/27/2019 1822   MONOABS 0.7 08/27/2019 1822   EOSABS 0.1 08/27/2019 1822   BASOSABS 0.0 08/27/2019 1822    BMET    Component Value Date/Time   NA 137 07/23/2020 2207   K 4.0 07/23/2020 2207   CL 101 07/23/2020 2207   CO2 27 07/23/2020 2207   GLUCOSE 109 (H) 07/23/2020 2207   BUN 13 07/23/2020 2207   CREATININE 1.02 07/23/2020 2207   CALCIUM 9.0 07/23/2020 2207   GFRNONAA >60 07/23/2020 2207   GFRAA >60 08/27/2019 1822    BNP No results found for: BNP  ProBNP No results found for: PROBNP  Imaging: CT ABDOMEN PELVIS W CONTRAST  Result Date: 07/23/2020 CLINICAL DATA:  Flank pain for several hours EXAM: CT ABDOMEN AND PELVIS WITH CONTRAST TECHNIQUE: Multidetector CT imaging of the abdomen and pelvis was performed using the standard protocol following bolus administration of intravenous contrast. CONTRAST:  152mL OMNIPAQUE IOHEXOL 300 MG/ML  SOLN COMPARISON:  02/21/2020 ultrasound FINDINGS: Lower chest: No acute abnormality. Hepatobiliary: Liver and gallbladder appear within normal limits. Calcification is noted along the margin of the right lobe of the liver. Pancreas: Unremarkable. No pancreatic ductal dilatation or surrounding inflammatory changes. Spleen: Normal in size without focal abnormality. Adrenals/Urinary  Tract: Adrenal glands are within normal limits. Kidneys demonstrate a normal enhancement pattern bilaterally. Normal excretion of contrast is noted bilaterally. Significant bilateral parapelvic cysts are noted as well as a large cortical cyst in the right kidney measuring 5 cm in greatest dimension. No renal calculi or obstructive changes are noted. The ureters are within normal limits. The bladder is decompressed. Stomach/Bowel: No obstructive or inflammatory changes of the colon are seen. The appendix is within normal limits. Mild retained fecal material is noted within the right colon. Small bowel and stomach are unremarkable. Vascular/Lymphatic: Aortic atherosclerosis. No enlarged abdominal or pelvic lymph nodes. Reproductive: Prostate is unremarkable. Large left hydrocele is noted. Other: No abdominal wall hernia or abnormality. No abdominopelvic ascites. Musculoskeletal: Degenerative changes of lumbar spine are noted. Changes of prior vertebral augmentation are seen at L1. IMPRESSION: Large left hydrocele. No renal calculi or obstructive changes are noted. Bilateral renal cysts. No other focal abnormality is noted. Electronically Signed   By: Inez Catalina M.D.   On: 07/23/2020 23:37     Assessment & Plan:   COPD, severe ([place]) - Patient reports worsening exertional dyspnea over the last 5 months. He has no other respiratory symptoms. Denies chest tightness, wheezing or cough  - PFTs in 2017 showed FEV1 1.07L (28%) - No oxygen desaturation on 6 minute walk test  - Emphysema noted on CT imaging  - Recommend changing Trelegy Ellipta to [employer] Aerosphere 2 puffs BID  - Encourage consistent exercise regimen, advised he get 20-30 mins of cardiovascular exercise 4-5 times a week - Checking CBC with diff, CMET, IgE, BNP and alpha 1 level  - FU in 4 weeks, if no improvement consider referral to pulmonary rehab and/or return to cardiology may benefit from stress test      Martyn Ehrich,  NP 08/20/2020

## 2020-08-20 NOTE — Assessment & Plan Note (Addendum)
-   Patient reports worsening exertional dyspnea over the last 5 months. He has no other respiratory symptoms. Denies chest tightness, wheezing or cough  - PFTs in 2017 showed FEV1 1.07L (28%) - No oxygen desaturation on 6 minute walk test  - Emphysema noted on CT imaging  - Recommend changing Trelegy Ellipta to [employer] Aerosphere 2 puffs BID  - Encourage consistent exercise regimen, advised he get 20-30 mins of cardiovascular exercise 4-5 times a week - Checking CBC with diff, CMET, IgE, BNP and alpha 1 level  - FU in 4 weeks, if no improvement consider referral to pulmonary rehab and/or return to cardiology may benefit from stress test

## 2020-09-03 MED ORDER — BREZTRI AEROSPHERE 160-9-4.8 MCG/ACT IN AERO
2.0000 | INHALATION_SPRAY | Freq: Two times a day (BID) | RESPIRATORY_TRACT | 11 refills | Status: DC
Start: 2020-09-03 — End: 2020-09-30

## 2020-09-16 ENCOUNTER — Telehealth: Payer: Self-pay

## 2020-09-16 NOTE — Telephone Encounter (Signed)
Patient informed to bring SD card to visit 09/17/2020.

## 2020-09-16 NOTE — Telephone Encounter (Signed)
Lm to request that patient bring SD card to 09/17/2020 OV.

## 2020-09-17 ENCOUNTER — Encounter: Payer: Self-pay | Admitting: Primary Care

## 2020-09-17 ENCOUNTER — Ambulatory Visit (INDEPENDENT_AMBULATORY_CARE_PROVIDER_SITE_OTHER): Payer: Medicare Other | Admitting: Primary Care

## 2020-09-17 ENCOUNTER — Other Ambulatory Visit
Admission: RE | Admit: 2020-09-17 | Discharge: 2020-09-17 | Disposition: A | Discharge status: Home / Self Care | Payer: Medicare Other | Attending: Primary Care | Admitting: Primary Care

## 2020-09-17 ENCOUNTER — Other Ambulatory Visit: Payer: Self-pay

## 2020-09-17 VITALS — BP 120/70 | HR 85 | Temp 97.8°F | Ht 68.5 in | Wt 197.2 lb

## 2020-09-17 DIAGNOSIS — J302 Other seasonal allergic rhinitis: Secondary | ICD-10-CM

## 2020-09-17 DIAGNOSIS — R0600 Dyspnea, unspecified: Secondary | ICD-10-CM | POA: Insufficient documentation

## 2020-09-17 DIAGNOSIS — G4730 Sleep apnea, unspecified: Secondary | ICD-10-CM | POA: Diagnosis not present

## 2020-09-17 DIAGNOSIS — J449 Chronic obstructive pulmonary disease, unspecified: Secondary | ICD-10-CM | POA: Insufficient documentation

## 2020-09-17 DIAGNOSIS — R0609 Other forms of dyspnea: Secondary | ICD-10-CM

## 2020-09-17 DIAGNOSIS — J309 Allergic rhinitis, unspecified: Secondary | ICD-10-CM

## 2020-09-17 LAB — COMPREHENSIVE METABOLIC PANEL
ALT: 36 U/L (ref 0–44)
AST: 24 U/L (ref 15–41)
Albumin: 4.2 g/dL (ref 3.5–5.0)
Alkaline Phosphatase: 38 U/L (ref 38–126)
Anion gap: 11 (ref 5–15)
BUN: 13 mg/dL (ref 8–23)
CO2: 24 mmol/L (ref 22–32)
Calcium: 8.9 mg/dL (ref 8.9–10.3)
Chloride: 103 mmol/L (ref 98–111)
Creatinine, Ser: 0.96 mg/dL (ref 0.61–1.24)
GFR, Estimated: >60 mL/min (ref >60)
Glucose, Bld: 101 mg/dL — ABNORMAL HIGH (ref 70–99)
Potassium: 4.2 mmol/L (ref 3.5–5.1)
Sodium: 138 mmol/L (ref 135–145)
Total Bilirubin: 0.9 mg/dL (ref 0.3–1.2)
Total Protein: 7.1 g/dL (ref 6.5–8.1)

## 2020-09-17 LAB — CBC WITH DIFFERENTIAL/PLATELET
Abs Immature Granulocytes: 0.03 10*3/uL (ref 0.00–0.07)
Basophils Absolute: 0 10*3/uL (ref 0.0–0.1)
Basophils Relative: 1 %
Eosinophils Absolute: 0.1 10*3/uL (ref 0.0–0.5)
Eosinophils Relative: 1 %
HCT: 42.1 % (ref 39.0–52.0)
Hemoglobin: 14.5 g/dL (ref 13.0–17.0)
Immature Granulocytes: 1 %
Lymphocytes Relative: 25 %
Lymphs Abs: 1.5 10*3/uL (ref 0.7–4.0)
MCH: 34.2 pg — ABNORMAL HIGH (ref 26.0–34.0)
MCHC: 34.4 g/dL (ref 30.0–36.0)
MCV: 99.3 fL (ref 80.0–100.0)
Monocytes Absolute: 0.4 10*3/uL (ref 0.1–1.0)
Monocytes Relative: 7 %
Neutro Abs: 4 10*3/uL (ref 1.7–7.7)
Neutrophils Relative %: 65 %
Platelets: 132 10*3/uL — ABNORMAL LOW (ref 150–400)
RBC: 4.24 MIL/uL (ref 4.22–5.81)
RDW: 12.3 % (ref 11.5–15.5)
WBC: 6.1 10*3/uL (ref 4.0–10.5)
nRBC: 0 % (ref 0.0–0.2)

## 2020-09-17 LAB — BRAIN NATRIURETIC PEPTIDE: B Natriuretic Peptide: 31.1 pg/mL (ref 0.0–100.0)

## 2020-09-17 MED ORDER — MONTELUKAST SODIUM 10 MG PO TABS
10.0000 mg | ORAL_TABLET | Freq: Every day | ORAL | 11 refills | Status: DC
Start: 2020-09-17 — End: 2021-07-27

## 2020-09-17 NOTE — Progress Notes (Signed)
Please let patient know labs were unremarkable. BNP, kidney function, liver function, WBC/eosinophils and HgB were normal.

## 2020-09-17 NOTE — Progress Notes (Signed)
@Patient  ID: David [name], male    DOB: September 18, 1955, 65 y.o.   MRN: [id number]  Chief Complaint  Patient presents with   Follow-up    SOB, OSA    Referring provider: Adin Hector, MD  HPI: 65 year old male, former smoker quit 1986.  History significant for COPD, nocturnal hypoxia, dysphagia, fibrosis of liver, hypothyroidism, B12 deficiency, hyperlipidemia, thrombocytopenia.  Patient of Dr. Mortimer Fries last seen in office on 07/09/2020.  PFTs in 2017 showed FEV1 28%. ONO positive for hypoxia on oxygen.  CT chest in 2019 showed emphysema.  No suspicious nodules or masses.  08/20/2020 Patient presents today for 6-minute walk test.  He reports shortness of breath has been worse over the last 5 months.  He is currently on Trelegy 200 daily, initial he noticed a significant improvement in his breathing when he first started maintenance inhaler.  Patient underwent 6-minute walk test today which showed no evidence of oxygen desaturations.  Patient had symptoms of dyspnea on exertion.  He had no problems with his breathing before covid-19 pandemic. He has never tested positive covid that he is aware of. His activity level did decrease over the last 2 years during this period of the pandemic. He was not getting regular exercise or golfing. States that he can walk "forever" downhill or level. He mainly experience dyspnea with inclines.  His weight has remained stable around 195-196lbs. He would like to lose 15-20 lbs, goal weight is between 175-185lb. He saw cardiology in October 2021, Cardiac CT calcium score was 0. Denies chest tightness, wheezing or cough.   09/17/2020- Interim hx  Patient presents today for 1 month follow-up. During last visit we gave patient a trial of Breztri and advised to set exercise regimen. Consider pulmonary rehab and/or referral back to cardiology if no improvement.   He has seen no difference in breathing since using Breztri versus Trelegy. He did go ahead and purchased 3  month supply of [employer]. Continues to have exertional dyspnea, more in the summer when he is working a lot outside. He admits to being less active in the winter and eating more than he should. He can occasionally experience twinge of chest pain at rest which only last for a few seconds. He reports shortness of breath when lying flat. He is complaint with CPAP. Pressure 7-18cm h20, residual AHI 0.1.   Airview download 06/19/20-09/16/20 71/90 days used (79%); 67 days (74%) > 4 hours Average usage days used 6 hours 16 mins Pressure 7-18cm h20 (11.2 cm h20- 95%) Airleaks 10L/min (95%) AHI 0.1   Cardiac testing 01/23/2020 echocardiogram-EF 50 to 55%, left ventricular diastolic parameters were normal.  Normal pulmonary artery systolic pressure.  No aortic stenosis is present.  Cardiac CT 02/04/2020 showed aortic arthrosclerosis. Calcium score 0. Emphysema changes noted in lungs.  Pulmonary testing:   PFTs in 2017 showed FEV1 1.03L (28%)   Allergies  Allergen Reactions   Penicillins Other (See Comments)    Unknown/ Childhood    Immunization History  Administered Date(s) Administered   Influenza Inj Mdck Quad Pf 02/15/2020   Influenza Split 01/09/2018   Influenza,inj,Quad PF,6+ Mos 01/21/2017, 12/18/2018   Influenza-Unspecified 12/26/2015   PFIZER Comirnaty(Gray Top)Covid-19 Tri-Sucrose Vaccine 03/11/2020   PFIZER(Purple Top)SARS-COV-2 Vaccination 06/24/2019, 07/15/2019   Pneumococcal Polysaccharide-23 12/15/2016   Td,absorbed, Preservative Free, Adult Use, Lf Unspecified 08/26/2016   Zoster Recombinat (Shingrix) 02/09/2017, 05/04/2017    Past Medical History:  Diagnosis Date   B12 deficiency    Complete tear  of right rotator cuff    Compression fracture of spine (HCC)    COPD (chronic obstructive pulmonary disease) (HCC)    DDD (degenerative disc disease), lumbar    H/O emphysema    Hyperlipidemia    Hypothyroidism    Lung nodule, multiple    3MM. AND 8 MM    Nocturnal hypoxia    Pneumonia    Shortness of breath on exertion    Sleep apnea    Thrombocytopenia (HCC)    Thyroid disease     Tobacco History: Social History   Tobacco Use  Smoking Status Former   Packs/day: 1.00   Pack years: 0.00   Types: Cigarettes   Quit date: 06/09/1984   Years since quitting: 36.3  Smokeless Tobacco Never   Counseling given: Not Answered   Outpatient Medications Prior to Visit  Medication Sig Dispense Refill   albuterol (PROAIR HFA) 108 (90 Base) MCG/ACT inhaler Inhale 2 puffs into the lungs every 6 (six) hours as needed for wheezing. 54 g 1   ipratropium-albuterol (DUONEB) 0.5-2.5 (3) MG/3ML SOLN Take 3 mLs by nebulization every 4 (four) hours as needed. 360 mL 3   levothyroxine (SYNTHROID, LEVOTHROID) 112 MCG tablet Take 112 mcg by mouth daily before breakfast.     lidocaine (LIDODERM) 5 % Place 1 patch onto the skin every 12 (twelve) hours. And apply one half patch to each area of concern. 10 patch 0   methylPREDNISolone (MEDROL DOSEPAK) 4 MG TBPK tablet Take as directed on package 1 each 0   montelukast (SINGULAIR) 10 MG tablet TAKE 1 TABLET(10 MG) BY MOUTH DAILY 30 tablet 2   oxybutynin (DITROPAN-XL) 5 MG 24 hr tablet Take 5 mg by mouth daily.     rosuvastatin (CRESTOR) 10 MG tablet Take 10 mg by mouth daily.     vitamin B-12 (CYANOCOBALAMIN) 1000 MCG tablet Take 1,000 mcg by mouth daily.     Vitamin D, Cholecalciferol, 10 MCG (400 UNIT) TABS Take by mouth.     Budeson-Glycopyrrol-Formoterol (BREZTRI AEROSPHERE) 160-9-4.8 MCG/ACT AERO Inhale 2 puffs into the lungs in the morning and at bedtime. 5.9 g 11   No facility-administered medications prior to visit.    Review of Systems  Review of Systems  Constitutional: Negative.   HENT: Negative.    Respiratory:  Positive for shortness of breath. Negative for chest tightness and wheezing.     Physical Exam  BP 120/70 (BP Location: Left Arm, Patient Position: Sitting, Cuff Size: Normal)    Pulse 85   Temp 97.8 F (36.6 C) (Temporal)   Ht 5' 8.5" (1.74 m)   Wt 197 lb 3.2 oz (89.4 kg)   SpO2 97%   BMI 29.55 kg/m  Physical Exam Constitutional:      Appearance: Normal appearance.  HENT:     Head: Normocephalic and atraumatic.  Cardiovascular:     Rate and Rhythm: Normal rate and regular rhythm.  Pulmonary:     Effort: Pulmonary effort is normal.     Breath sounds: Normal breath sounds. No wheezing, rhonchi or rales.  Neurological:     Mental Status: He is alert.  Psychiatric:        Mood and Affect: Mood normal.        Behavior: Behavior normal.        Thought Content: Thought content normal.        Judgment: Judgment normal.     Lab Results:  CBC    Component Value Date/Time   WBC 6.1 09/17/2020  1208   RBC 4.24 09/17/2020 1208   HGB 14.5 09/17/2020 1208   HCT 42.1 09/17/2020 1208   PLT 132 (L) 09/17/2020 1208   MCV 99.3 09/17/2020 1208   MCH 34.2 (H) 09/17/2020 1208   MCHC 34.4 09/17/2020 1208   RDW 12.3 09/17/2020 1208   LYMPHSABS 1.5 09/17/2020 1208   MONOABS 0.4 09/17/2020 1208   EOSABS 0.1 09/17/2020 1208   BASOSABS 0.0 09/17/2020 1208    BMET    Component Value Date/Time   NA 138 09/17/2020 1208   K 4.2 09/17/2020 1208   CL 103 09/17/2020 1208   CO2 24 09/17/2020 1208   GLUCOSE 101 (H) 09/17/2020 1208   BUN 13 09/17/2020 1208   CREATININE 0.96 09/17/2020 1208   CALCIUM 8.9 09/17/2020 1208   GFRNONAA >60 09/17/2020 1208   GFRAA >60 08/27/2019 1822    BNP    Component Value Date/Time   BNP 47.6 09/26/2020 1100    ProBNP No results found for: PROBNP  Imaging: No results found.   Assessment & Plan:   COPD, severe ([place]) - PFTs in 2017 showed FEVQ 1.07L (28%); CT chest in 2019 showed emphysema;  6-minute walk test today which showed no evidence of oxygen desaturations - No difference in breathing with Breztri Aerosphere versus Trelegy. He would like to continue on breztri and has purchased 3 month supply  Sleep apnea - He is  complaint with CPAP. Pressure 7-18cm h20, residual AHI 0.1.  - No changes today  - FU in 6 months with Dr. Mortimer Fries  Exertional dyspnea - He continues to have exertional dyspnea despite being on triple therapy for COPD - Ordering exercise stress test and advised he return to cardiology for follow-up   Seasonal allergies - Patient experiences seasonal allergies, breathing is worse in summer months - Patient requesting referral to allergist      Martyn Ehrich, NP 10/01/2020

## 2020-09-17 NOTE — Patient Instructions (Addendum)
Recommendations: Continue to wear CPAP every night for 4-6 hours or longer Continue Breztri Aerosphere two puffs morning and evening Use Ventolin rescue inhaler 2 puffs every 6 hours as needed for breakthrough shrotness of rbeath/wheezing Call Dr. Knute Neu office to schedule follow-up with them [phone]  Orders: Labs today Exercise stress test  Follow-up: 6 months with Dr. Mortimer Fries or sooner if needed

## 2020-09-22 LAB — IGE: IgE (Immunoglobulin E), Serum: 13 IU/mL (ref 6–495)

## 2020-09-26 ENCOUNTER — Other Ambulatory Visit
Admission: RE | Admit: 2020-09-26 | Discharge: 2020-09-26 | Disposition: A | Discharge status: Home / Self Care | Payer: Medicare Other | Source: Ambulatory Visit | Attending: Family Medicine | Admitting: Family Medicine

## 2020-09-26 DIAGNOSIS — R059 Cough, unspecified: Secondary | ICD-10-CM | POA: Diagnosis present

## 2020-09-26 DIAGNOSIS — R0600 Dyspnea, unspecified: Secondary | ICD-10-CM | POA: Insufficient documentation

## 2020-09-26 LAB — BRAIN NATRIURETIC PEPTIDE: B Natriuretic Peptide: 47.6 pg/mL (ref 0.0–100.0)

## 2020-09-29 ENCOUNTER — Telehealth: Payer: Self-pay | Admitting: Primary Care

## 2020-09-29 DIAGNOSIS — J3089 Other allergic rhinitis: Secondary | ICD-10-CM

## 2020-09-29 NOTE — Telephone Encounter (Signed)
Spoke to patient, who is requesting a referral to allergist. C/o seasonal allergies.   Patient also states that he d/c breztri and resumed symbicort and spiriva. He would like refills.   Beth, please advise on refills and referral. Thanks

## 2020-09-30 MED ORDER — BUDESONIDE-FORMOTEROL FUMARATE 160-4.5 MCG/ACT IN AERO: 2.0000 | INHALATION_SPRAY | Freq: Two times a day (BID) | RESPIRATORY_TRACT | 12 refills | Status: DC

## 2020-09-30 MED ORDER — SPIRIVA RESPIMAT 2.5 MCG/ACT IN AERS: 2.0000 | INHALATION_SPRAY | Freq: Every day | RESPIRATORY_TRACT | 11 refills | Status: DC

## 2020-09-30 NOTE — Telephone Encounter (Signed)
Yes to Allergy referral and please d/c breztri and send in Rx for symb +sprivia

## 2020-09-30 NOTE — Telephone Encounter (Signed)
Patient checking on referral. Also on refills for inhalers. Patient phone number is [phone].

## 2020-09-30 NOTE — Telephone Encounter (Signed)
Patient is aware of Beth's response and voiced his understanding.  Referral placed for allergy.  Spiriva and Symbicort sent to preferred pharmacy. Nothing further needed at this time.

## 2020-10-01 ENCOUNTER — Encounter: Payer: Self-pay | Admitting: Primary Care

## 2020-10-01 DIAGNOSIS — G4730 Sleep apnea, unspecified: Secondary | ICD-10-CM | POA: Insufficient documentation

## 2020-10-01 DIAGNOSIS — R0609 Other forms of dyspnea: Secondary | ICD-10-CM | POA: Insufficient documentation

## 2020-10-01 DIAGNOSIS — J302 Other seasonal allergic rhinitis: Secondary | ICD-10-CM | POA: Insufficient documentation

## 2020-10-01 NOTE — Assessment & Plan Note (Addendum)
-   He is complaint with CPAP. Pressure 7-18cm h20, residual AHI 0.1.  - No changes today  - FU in 6 months with Dr. Mortimer Fries

## 2020-10-01 NOTE — Assessment & Plan Note (Addendum)
-   PFTs in 2017 showed FEVQ 1.07L (28%); CT chest in 2019 showed emphysema;  6-minute walk test today which showed no evidence of oxygen desaturations - No difference in breathing with Breztri Aerosphere versus Trelegy. He would like to continue on breztri and has purchased 3 month supply

## 2020-10-01 NOTE — Telephone Encounter (Signed)
Received call from [place] with David [name], who stated that she spoke with patient yesterday and he switched to switch breztri to a nebulizer medication.   Lm for patient

## 2020-10-01 NOTE — Assessment & Plan Note (Addendum)
-   He continues to have exertional dyspnea despite being on triple therapy for COPD - Ordering exercise stress test and advised he return to cardiology for follow-up

## 2020-10-01 NOTE — Telephone Encounter (Signed)
Spoke to patient, who stated that she does not wish to proceed with nebulizer machine. He would like to continue with spiriva and Symbicort

## 2020-10-01 NOTE — Assessment & Plan Note (Addendum)
-   Patient experiences seasonal allergies, breathing is worse in summer months - Patient requesting referral to allergist

## 2020-10-30 ENCOUNTER — Telehealth: Payer: Self-pay

## 2020-10-30 NOTE — Telephone Encounter (Signed)
Fax from [place] signed and dated buy Dr Mortimer Fries, faxed back to [place] on 10/30/2020

## 2020-11-06 ENCOUNTER — Encounter (HOSPITAL_COMMUNITY): Payer: Medicare Other

## 2020-11-18 ENCOUNTER — Telehealth: Payer: Self-pay | Admitting: Primary Care

## 2020-11-18 ENCOUNTER — Other Ambulatory Visit: Payer: Self-pay

## 2020-11-18 ENCOUNTER — Ambulatory Visit (HOSPITAL_COMMUNITY): Payer: Medicare Other | Attending: Internal Medicine

## 2020-11-18 DIAGNOSIS — R0600 Dyspnea, unspecified: Secondary | ICD-10-CM | POA: Diagnosis present

## 2020-11-18 DIAGNOSIS — R0609 Other forms of dyspnea: Secondary | ICD-10-CM

## 2020-11-18 DIAGNOSIS — J449 Chronic obstructive pulmonary disease, unspecified: Secondary | ICD-10-CM

## 2020-11-19 ENCOUNTER — Other Ambulatory Visit (HOSPITAL_COMMUNITY): Payer: Self-pay | Admitting: *Deleted

## 2020-11-19 DIAGNOSIS — R0600 Dyspnea, unspecified: Secondary | ICD-10-CM

## 2020-11-19 DIAGNOSIS — R0609 Other forms of dyspnea: Secondary | ICD-10-CM

## 2020-11-19 DIAGNOSIS — J449 Chronic obstructive pulmonary disease, unspecified: Secondary | ICD-10-CM

## 2020-11-19 NOTE — Telephone Encounter (Signed)
Received call from [facility], wrong order was placed. New order has been done. Nothing further needed at this time.

## 2020-11-19 NOTE — Telephone Encounter (Signed)
ATC Kristen to let her know that new order for stress test has been placed and if she needed anything to call us back. Nothing further needed at this time.

## 2020-11-19 NOTE — Addendum Note (Signed)
Addended by: Elby Beck R on: 11/19/2020 04:13 PM   Modules accepted: Orders

## 2020-11-24 ENCOUNTER — Other Ambulatory Visit: Payer: Self-pay | Admitting: Internal Medicine

## 2020-11-24 DIAGNOSIS — J449 Chronic obstructive pulmonary disease, unspecified: Secondary | ICD-10-CM

## 2020-12-01 DIAGNOSIS — J449 Chronic obstructive pulmonary disease, unspecified: Secondary | ICD-10-CM

## 2020-12-01 NOTE — Telephone Encounter (Signed)
Dr. Kasa, please advise. Thanks °

## 2020-12-02 NOTE — Progress Notes (Signed)
Pre-exercise spirometry demonstrates severe restrictive/obstructive patterns   Exercise testing demonstrated moderate functional impairment d/t underlying severe COPD. Oxygen desaturated to 88% with peak exercise.He may require oxygen if not already using. I would send in DME order for best fit eval

## 2020-12-02 NOTE — Telephone Encounter (Signed)
Send result note to you

## 2020-12-02 NOTE — Telephone Encounter (Signed)
Patient is request cardiopulmonary stress test results.   Beth, please advise. Thanks

## 2020-12-03 NOTE — Telephone Encounter (Signed)
Beth, please release CPX results to mychart. Thanks

## 2020-12-03 NOTE — Telephone Encounter (Signed)
Results discussed with patient via telephone

## 2020-12-03 NOTE — Telephone Encounter (Signed)
I can not speak to why he can not see result in mychart. When I go to release results it is not on list of releasable testing. If he would like copy he can go through medical records.

## 2020-12-21 ENCOUNTER — Other Ambulatory Visit: Payer: Self-pay | Admitting: *Deleted

## 2020-12-21 MED ORDER — SPIRIVA RESPIMAT 2.5 MCG/ACT IN AERS: 2.0000 | INHALATION_SPRAY | Freq: Every day | RESPIRATORY_TRACT | 3 refills | Status: DC

## 2020-12-21 MED ORDER — MONTELUKAST SODIUM 10 MG PO TABS: ORAL_TABLET | ORAL | 2 refills | Status: DC

## 2020-12-21 MED ORDER — BUDESONIDE-FORMOTEROL FUMARATE 160-4.5 MCG/ACT IN AERO: 2.0000 | INHALATION_SPRAY | Freq: Two times a day (BID) | RESPIRATORY_TRACT | 3 refills | Status: DC

## 2020-12-23 MED ORDER — ALBUTEROL SULFATE HFA 108 (90 BASE) MCG/ACT IN AERS: 2.0000 | INHALATION_SPRAY | Freq: Four times a day (QID) | RESPIRATORY_TRACT | 1 refills | Status: DC | PRN

## 2020-12-23 NOTE — Addendum Note (Signed)
Addended by: Claudette Head A on: 12/23/2020 04:14 PM   Modules accepted: Orders

## 2021-01-02 NOTE — Progress Notes (Signed)
Can you review during visit on 01/07/21

## 2021-01-06 ENCOUNTER — Telehealth: Payer: Self-pay

## 2021-01-06 NOTE — Telephone Encounter (Signed)
Called and spoke to patient about upcoming appointment. Patient states will bring his sd card with him to his appt. Nothing further needed

## 2021-01-07 ENCOUNTER — Other Ambulatory Visit: Payer: Self-pay

## 2021-01-07 ENCOUNTER — Encounter: Payer: Self-pay | Admitting: Internal Medicine

## 2021-01-07 ENCOUNTER — Ambulatory Visit (INDEPENDENT_AMBULATORY_CARE_PROVIDER_SITE_OTHER): Payer: Medicare Other | Admitting: Internal Medicine

## 2021-01-07 VITALS — BP 136/74 | HR 79 | Temp 97.3°F | Ht 69.0 in | Wt 197.0 lb

## 2021-01-07 DIAGNOSIS — R0789 Other chest pain: Secondary | ICD-10-CM | POA: Diagnosis not present

## 2021-01-07 DIAGNOSIS — J449 Chronic obstructive pulmonary disease, unspecified: Secondary | ICD-10-CM | POA: Diagnosis not present

## 2021-01-07 DIAGNOSIS — H9190 Unspecified hearing loss, unspecified ear: Secondary | ICD-10-CM

## 2021-01-07 DIAGNOSIS — G4733 Obstructive sleep apnea (adult) (pediatric): Secondary | ICD-10-CM

## 2021-01-07 NOTE — Patient Instructions (Signed)
Continue inhalers as prescribed Exercise as tolerated Continue CPAP as prescribed  Referral to cardiology

## 2021-01-07 NOTE — Progress Notes (Signed)
[facility] Consultation      Date: 01/07/2021,   MRN# [id number] David [name] 08-Jul-1955  David [name] is a 65 y.o. old male seen in consultation for COPD   Synopsis estabished care in 2017 for Severe COPD PFT's 10/2015 Fev1 28% 1.03L  6MWT WNL 95% o2 sat at 1083 feet ONO +for hypoxia on oxygen  CT chest 3.15.19 Reviewed with patient +emphysema No suspicious nodules, massess No effusions No pneumonia  Previous compliance report  compliance report shared with patient AHI 0.1 100% compliance AUTOCPAP 7-18c hes  CHIEF COMPLAINT:   Follow-up OSA  Follow up COPD      HISTORY OF PRESENT ILLNESS   COPD seems to be stable at this time  Last exacerbation was in March   No exacerbation at this time No evidence of heart failure at this time No evidence or signs of infection at this time No respiratory distress No fevers, chills, nausea, vomiting, diarrhea No evidence of lower extremity edema No evidence hemoptysis    FEV1 24%  Very severe obstructive airways disease  Stays active  Plays golf  Diagnosed with COPD 1998  Seems to be stable on Symbicort and Spiriva    OSA Excellent compliance report   **12/2019 compliance report Excellent compliance report AHI down to 0.1  **07/09/2020 Excellent compliance report 100% for days Greater than 97% percent for over 4 hours Auto CPAP 7 to 18 cm water pressure AHI 0.1  **12/2020 Excellent compliance report 100% for days 97% greater than 4 hours AHI reduced to 0.2     Current Medication:  Current Outpatient Medications:    albuterol (PROAIR HFA) 108 (90 Base) MCG/ACT inhaler, Inhale 2 puffs into the lungs every 6 (six) hours as needed for wheezing., Disp: 54 g, Rfl: 1   budesonide-formoterol (SYMBICORT) 160-4.5 MCG/ACT inhaler, Inhale 2 puffs into the lungs 2 (two) times daily., Disp: 3 each, Rfl: 3   ipratropium-albuterol (DUONEB) 0.5-2.5 (3) MG/3ML SOLN, Take 3 mLs by  nebulization every 4 (four) hours as needed., Disp: 360 mL, Rfl: 3   levothyroxine (SYNTHROID, LEVOTHROID) 112 MCG tablet, Take 112 mcg by mouth daily before breakfast., Disp: , Rfl:    methylPREDNISolone (MEDROL DOSEPAK) 4 MG TBPK tablet, Take as directed on package, Disp: 1 each, Rfl: 0   montelukast (SINGULAIR) 10 MG tablet, Take 1 tablet (10 mg total) by mouth daily., Disp: 30 tablet, Rfl: 11   montelukast (SINGULAIR) 10 MG tablet, TAKE 1 TABLET(10 MG) BY MOUTH DAILY, Disp: 90 tablet, Rfl: 2   oxybutynin (DITROPAN-XL) 5 MG 24 hr tablet, Take 5 mg by mouth daily., Disp: , Rfl:    rosuvastatin (CRESTOR) 10 MG tablet, Take 10 mg by mouth daily., Disp: , Rfl:    Tiotropium Bromide Monohydrate (SPIRIVA RESPIMAT) 2.5 MCG/ACT AERS, Inhale 2 puffs into the lungs daily., Disp: 12 g, Rfl: 3   vitamin B-12 (CYANOCOBALAMIN) 1000 MCG tablet, Take 1,000 mcg by mouth daily., Disp: , Rfl:    Vitamin D, Cholecalciferol, 10 MCG (400 UNIT) TABS, Take by mouth., Disp: , Rfl:    lidocaine (LIDODERM) 5 %, Place 1 patch onto the skin every 12 (twelve) hours. And apply one half patch to each area of concern., Disp: 10 patch, Rfl: 0   TRELEGY ELLIPTA 200-62.5-25 MCG/INH AEPB, INHALE 1 PUFF BY MOUTH INTO LUNGS EVERY DAY, Disp: 60 each, Rfl: 10    ALLERGIES   Penicillins     REVIEW OF SYSTEMS   Review of Systems  Constitutional:  Negative for chills, diaphoresis, fever, malaise/fatigue and weight loss.  HENT:  Positive for congestion. Negative for hearing loss.   Respiratory:  Positive for cough, sputum production, shortness of breath and wheezing. Negative for hemoptysis.   Cardiovascular:  Negative for chest pain, palpitations, orthopnea and leg swelling.  Gastrointestinal:  Negative for heartburn and nausea.  Skin:  Negative for rash.  Neurological:  Negative for weakness and headaches.  Psychiatric/Behavioral:  Negative for depression.   All other systems reviewed and are negative. BP 136/74 (BP  Location: Left Arm, Patient Position: Sitting, Cuff Size: Normal)   Pulse 79   Temp (!) 97.3 F (36.3 C)   Ht 5\' 9"  (1.753 m)   Wt 197 lb (89.4 kg)   SpO2 96%   BMI 29.09 kg/m    Physical Examination:   General Appearance: No distress  EYES PERRLA, EOM intact.   NECK Supple, No JVD Pulmonary: normal breath sounds, No wheezing.  CardiovascularNormal S1,S2.  No m/r/g.   ALL OTHER ROS ARE NEGATIVE    CT chest 3.15.19 Emphysematous changes, pulmonary scarring and patchy areas of peribronchial thickening and minimal bronchiectasis. No suspicious nodules, masses   ASSESSMENT/PLAN   65 year old pleasant white male seen today for follow-up severe end-stage COPD Gold stage D  Chronic respiratory insufficiency with underlying sleep apnea   Severe COPD Gold stage D No indication of prednisone or antibiotics at this time Continue inhalers as prescribed   OSA Patient using and benefiting from CPAP therapy Continue auto CPAP 7-18 Excellent compliance report Reviewed in detail with patient  Respiratory insufficiency with shortness of breath dyspnea exertion Related to COPD No indication for oxygen at this time based on 6-minute walk test in May 2022    MEDICATION ADJUSTMENTS/LABS AND TESTS ORDERED:  Continue CPAP as prescribed LUNG CANCER SCREENING REFERRAL PROGRAM Continue inhalers as prescribed No indication for oxygen at this time Continue CPAP as prescribed  Intermittent chest pressure Referral to audiology for assessment   CURRENT MEDICATIONS REVIEWED AT LENGTH WITH PATIENT TODAY   Patient satisfied with Plan of action and management. All questions answered  Follow-up 6 months  Total time spent 31 minutes  [name], M.D.  [facility]  Medical Director [place] Director [facility] Cardio-Pulmonary Department

## 2021-01-09 NOTE — Addendum Note (Signed)
Addended by: Carlisle Cater on: 01/09/2021 12:02 PM   Modules accepted: Orders

## 2021-01-12 ENCOUNTER — Telehealth: Payer: Self-pay | Admitting: Internal Medicine

## 2021-01-12 DIAGNOSIS — R0789 Other chest pain: Secondary | ICD-10-CM

## 2021-01-12 NOTE — Telephone Encounter (Signed)
Spoke to patient via telephone.  Patient is questioning why he was referred to ENT instead of cardiology.  It appears that referral was placed incorrectly.  Cardiology referral has been placed. Patient is aware and voiced his understanding.  Nothing further needed at this time.

## 2021-01-12 NOTE — Addendum Note (Signed)
Addended by: Claudette Head A on: 01/12/2021 04:28 PM   Modules accepted: Orders

## 2021-01-16 ENCOUNTER — Other Ambulatory Visit: Payer: Self-pay

## 2021-01-16 ENCOUNTER — Encounter: Payer: Self-pay | Admitting: Cardiology

## 2021-01-16 ENCOUNTER — Ambulatory Visit (INDEPENDENT_AMBULATORY_CARE_PROVIDER_SITE_OTHER): Payer: Medicare Other | Admitting: Cardiology

## 2021-01-16 VITALS — BP 120/84 | HR 79 | Ht 69.0 in | Wt 198.0 lb

## 2021-01-16 DIAGNOSIS — R072 Precordial pain: Secondary | ICD-10-CM | POA: Diagnosis not present

## 2021-01-16 DIAGNOSIS — R0602 Shortness of breath: Secondary | ICD-10-CM | POA: Diagnosis not present

## 2021-01-16 DIAGNOSIS — E7800 Pure hypercholesterolemia, unspecified: Secondary | ICD-10-CM

## 2021-01-16 NOTE — Progress Notes (Signed)
Cardiology Office Note:    Date:  01/16/2021   ID:  David [name], DOB Feb 05, 1956, MRN [id number]  PCP:  Adin Hector, MD  [place] Cardiologist:  Kate Sable, MD  [place] Electrophysiologist:  None   Referring MD: Flora Lipps, MD   Chief Complaint  Patient presents with   Other    Pr Dr. Mortimer Fries - Chest pressure. Meds reviewed verbally with patient.    History of Present Illness:    David [name] is a 64 y.o. male with a hx of hyperlipidemia, former smoker x15 years, COPD who presents due to chest pain.  States having occasional left-sided chest pressure which she rates as 2 out of 10 in severity.  Symptoms are not associated with exertion lasting a few seconds.  He states symptoms are not bothersome, he believes this might be "mental".  Gets shortness of breath with minimal exertion which he attributes to COPD.  Previously seen 1 year ago due to dyspnea on exertion.  Underwent echocardiogram and coronary CTA in 2021 which were normal.   Prior notes Echo 72/5366 normal systolic function, EF 50 to 44%, normal diastolic function. Coronary CTA 01/2020 calcium score of 0, no evidence for CAD.   Past Medical History:  Diagnosis Date   B12 deficiency    Complete tear of right rotator cuff    Compression fracture of spine (HCC)    COPD (chronic obstructive pulmonary disease) (HCC)    DDD (degenerative disc disease), lumbar    H/O emphysema    Hyperlipidemia    Hypothyroidism    Lung nodule, multiple    3MM. AND 8 MM   Nocturnal hypoxia    Pneumonia    Shortness of breath on exertion    Sleep apnea    Thrombocytopenia (HCC)    Thyroid disease     Past Surgical History:  Procedure Laterality Date   ANTERIOR CERVICAL DECOMP/DISCECTOMY FUSION N/A 08/20/2019   Procedure: ANTERIOR CERVICAL DECOMPRESSION/DISCECTOMY FUSION 2 LEVELS C4/5, C5/6;  Surgeon: [name], MD;  Location: [facility];  Service: Neurosurgery;  Laterality: N/A;   BACK SURGERY   1991   L5-S1   COLONOSCOPY     ELBOW ARTHROSCOPY Right    ESOPHAGOGASTRODUODENOSCOPY (EGD) WITH PROPOFOL N/A 09/26/2017   Procedure: ESOPHAGOGASTRODUODENOSCOPY (EGD) WITH PROPOFOL;  Surgeon: Lin Landsman, MD;  Location: [facility];  Service: Gastroenterology;  Laterality: N/A;   HEMORRHOID SURGERY  1982   KYPHOPLASTY N/A 06/10/2016   Procedure: KYPHOPLASTY T12;  Surgeon: Hessie Knows, MD;  Location: [facility];  Service: Orthopedics;  Laterality: N/A;   KYPHOPLASTY     T12   SHOULDER ARTHROSCOPY     TENODESIS LONG TENDON BICEPS Right    TONSILLECTOMY     WISDOM TOOTH EXTRACTION  1982    Current Medications: Current Meds  Medication Sig   albuterol (PROAIR HFA) 108 (90 Base) MCG/ACT inhaler Inhale 2 puffs into the lungs every 6 (six) hours as needed for wheezing.   budesonide-formoterol (SYMBICORT) 160-4.5 MCG/ACT inhaler Inhale 2 puffs into the lungs 2 (two) times daily.   ipratropium-albuterol (DUONEB) 0.5-2.5 (3) MG/3ML SOLN Take 3 mLs by nebulization every 4 (four) hours as needed.   levothyroxine (SYNTHROID, LEVOTHROID) 112 MCG tablet Take 112 mcg by mouth daily before breakfast.   methylPREDNISolone (MEDROL DOSEPAK) 4 MG TBPK tablet Take as directed on package   montelukast (SINGULAIR) 10 MG tablet Take 1 tablet (10 mg total) by mouth daily.   oxybutynin (DITROPAN-XL) 5 MG  24 hr tablet Take 5 mg by mouth daily.   rosuvastatin (CRESTOR) 10 MG tablet Take 10 mg by mouth daily.   Tiotropium Bromide Monohydrate (SPIRIVA RESPIMAT) 2.5 MCG/ACT AERS Inhale 2 puffs into the lungs daily.   vitamin B-12 (CYANOCOBALAMIN) 1000 MCG tablet Take 1,000 mcg by mouth daily.   Vitamin D, Cholecalciferol, 10 MCG (400 UNIT) TABS Take by mouth.     Allergies:   Penicillins   Social History   Socioeconomic History   Marital status: Married    Spouse name: Not on file   Number of children: Not on file   Years of education: Not on file   Highest education level: Not on file  Occupational  History   Not on file  Tobacco Use   Smoking status: Former    Packs/day: 1.00    Types: Cigarettes    Quit date: 06/09/1984    Years since quitting: 36.6   Smokeless tobacco: Never  Vaping Use   Vaping Use: Never used  Substance and Sexual Activity   Alcohol use: Yes    Alcohol/week: 14.0 standard drinks    Types: 14 Cans of beer per week    Comment: 1-2 beers daily   Drug use: No   Sexual activity: Not on file  Other Topics Concern   Not on file  Social History Narrative   Not on file   Social Determinants of Health   Financial Resource Strain: Not on file  Food Insecurity: Not on file  Transportation Needs: Not on file  Physical Activity: Not on file  Stress: Not on file  Social Connections: Not on file     Family History: The patient's family history includes Heart disease in his father.  ROS:   Please see the history of present illness.     All other systems reviewed and are negative.  EKGs/Labs/Other Studies Reviewed:    The following studies were reviewed today:   EKG:  EKG is  ordered today.  The ekg ordered today demonstrates normal sinus rhythm, normal ECG.  Recent Labs: 09/17/2020: ALT 36; BUN 13; Creatinine, Ser 0.96; Hemoglobin 14.5; Platelets 132; Potassium 4.2; Sodium 138 09/26/2020: B Natriuretic Peptide 47.6  Recent Lipid Panel No results found for: CHOL, TRIG, HDL, CHOLHDL, VLDL, LDLCALC, LDLDIRECT   Risk Assessment/Calculations:      Physical Exam:    VS:  BP 120/84 (BP Location: Left Arm, Patient Position: Sitting, Cuff Size: Normal)   Pulse 79   Ht 5\' 9"  (1.753 m)   Wt 198 lb (89.8 kg)   SpO2 98%   BMI 29.24 kg/m     Wt Readings from Last 3 Encounters:  01/16/21 198 lb (89.8 kg)  01/07/21 197 lb (89.4 kg)  09/17/20 197 lb 3.2 oz (89.4 kg)     GEN:  Well nourished, well developed in no acute distress HEENT: Normal NECK: No JVD; No carotid bruits LYMPHATICS: No lymphadenopathy CARDIAC: RRR, no murmurs, rubs,  gallops RESPIRATORY: Clear, decreased breath sounds at bases, no wheezing ABDOMEN: Soft, non-tender, non-distended MUSCULOSKELETAL:  No edema; No deformity  SKIN: Warm and dry NEUROLOGIC:  Alert and oriented x 3 PSYCHIATRIC:  Normal affect   ASSESSMENT:    1. Precordial pain   2. Pure hypercholesterolemia   3. Shortness of breath     PLAN:    In order of problems listed above:  Chest pain, atypical, not consistent with angina.  Prior echocardiogram was normal.  Coronary CTA with calcium score of 0, no evidence of  CAD.  Patient made aware of results.  We discussed possibly starting a long-acting nitrate to see if this will help, but patient declined stating symptoms are not really bad and will not want to start any medicine at this time. Hyperlipidemia, continue Crestor. Shortness of breath secondary to COPD.  Continue management as per pulmonary medicine.  Follow-up in 1 year  Total encounter time more 35 minutes  Greater than 50% was spent in counseling and coordination of care with the patient    Medication Adjustments/Labs and Tests Ordered: Current medicines are reviewed at length with the patient today.  Concerns regarding medicines are outlined above.  Orders Placed This Encounter  Procedures   EKG 12-Lead    No orders of the defined types were placed in this encounter.   Patient Instructions  Medication Instructions:  Your physician recommends that you continue on your current medications as directed. Please refer to the Current Medication list given to you today.  *If you need a refill on your cardiac medications before your next appointment, please call your pharmacy*   Lab Work: None ordered If you have labs (blood work) drawn today and your tests are completely normal, you will receive your results only by: [place] (if you have MyChart) OR A paper copy in the mail If you have any lab test that is abnormal or we need to change your treatment, we  will call you to review the results.   Testing/Procedures: None ordered   Follow-Up: At [facility], you and your health needs are our priority.  As part of our continuing mission to provide you with exceptional heart care, we have created designated Provider Care Teams.  These Care Teams include your primary Cardiologist (physician) and Advanced Practice Providers (APPs -  Physician Assistants and Nurse Practitioners) who all work together to provide you with the care you need, when you need it.  We recommend signing up for the patient portal called "MyChart".  Sign up information is provided on this After Visit Summary.  MyChart is used to connect with patients for Virtual Visits (Telemedicine).  Patients are able to view lab/test results, encounter notes, upcoming appointments, etc.  Non-urgent messages can be sent to your provider as well.   To learn more about what you can do with MyChart, go to [email or web address].    Your next appointment:   1 year(s)  The format for your next appointment:   In Person  Provider:   Kate Sable, MD   Other Instructions    Signed, Kate Sable, MD  01/16/2021 12:22 PM    [place]

## 2021-01-16 NOTE — Patient Instructions (Signed)

## 2021-07-13 ENCOUNTER — Telehealth: Payer: Self-pay | Admitting: Internal Medicine

## 2021-07-14 NOTE — Telephone Encounter (Signed)
ATC x1.  No answer.  LVM to return call.

## 2021-07-15 NOTE — Telephone Encounter (Signed)
ATC patient. LVM to return call. 

## 2021-07-16 NOTE — Telephone Encounter (Signed)
Called and spoke with patient, patient needs his handicapped placard renewed.  I advised him that we need for him to bring or mail in his form so we can complete it for him.  He is going to [place] on 07/24/21 and will be back in May.  Patient states he will either find the form or get a new one and bring it by for completion. ?He also said he needs a new CPAP, it is from 2018 and it causes a bad taste in his mouth.  He uses [employer] to clean it.  I advised him that the providers recommend cleaning daily with warm soapy water, rinse and air dry.  I scheduled him to see Dr. Mortimer Fries on 09/09/20 at 3 pm, advised to arrive by 2:45 pm for check in.  Nothing further needed at this time. ?

## 2021-07-25 ENCOUNTER — Other Ambulatory Visit: Payer: Self-pay | Admitting: Primary Care

## 2021-07-25 ENCOUNTER — Other Ambulatory Visit: Payer: Self-pay | Admitting: Internal Medicine

## 2021-07-25 DIAGNOSIS — J449 Chronic obstructive pulmonary disease, unspecified: Secondary | ICD-10-CM

## 2021-07-25 DIAGNOSIS — J309 Allergic rhinitis, unspecified: Secondary | ICD-10-CM

## 2021-08-28 ENCOUNTER — Telehealth: Payer: Self-pay | Admitting: Internal Medicine

## 2021-08-28 DIAGNOSIS — G4733 Obstructive sleep apnea (adult) (pediatric): Secondary | ICD-10-CM

## 2021-08-28 NOTE — Telephone Encounter (Signed)
Spoke to patient.  He feels that he may need a new cpap machine. He got his machine in 2017. He feels that his mask is not sealing correctly but he is not getting good readings.   Dr. Mortimer Fries, please advise. Thanks

## 2021-09-01 NOTE — Telephone Encounter (Signed)
Order placed for cpap and mask fitting. Patient is aware and voiced his understanding.  Nothing further needed.

## 2021-09-09 ENCOUNTER — Encounter: Payer: Self-pay | Admitting: Internal Medicine

## 2021-09-09 ENCOUNTER — Ambulatory Visit: Payer: Medicare Other | Admitting: Internal Medicine

## 2021-09-09 ENCOUNTER — Ambulatory Visit (INDEPENDENT_AMBULATORY_CARE_PROVIDER_SITE_OTHER): Payer: Medicare Other | Admitting: Internal Medicine

## 2021-09-09 ENCOUNTER — Other Ambulatory Visit: Payer: Self-pay | Admitting: Internal Medicine

## 2021-09-09 VITALS — BP 116/72 | HR 77 | Temp 97.7°F | Ht 68.0 in | Wt 198.4 lb

## 2021-09-09 DIAGNOSIS — G4733 Obstructive sleep apnea (adult) (pediatric): Secondary | ICD-10-CM

## 2021-09-09 DIAGNOSIS — J449 Chronic obstructive pulmonary disease, unspecified: Secondary | ICD-10-CM | POA: Diagnosis not present

## 2021-09-09 DIAGNOSIS — K7469 Other cirrhosis of liver: Secondary | ICD-10-CM

## 2021-09-09 NOTE — Patient Instructions (Addendum)
Continue inhalers as prescribed  Recommend weight loss approximately 10 pounds in the next 3 months  Await CPAP compliance download

## 2021-09-09 NOTE — Progress Notes (Signed)
[place] [facility] Consultation      Date: 09/09/2021,   MRN# [id number] David [name] 19-Apr-1955  David [name] is a 66 y.o. old male seen in consultation for COPD   Synopsis estabished care in 2017 for Severe COPD PFT's 10/2015 Fev1 28% 1.03L  6MWT WNL 95% o2 sat at 1083 feet ONO +for hypoxia on oxygen  CT chest 3.15.19 Reviewed with patient +emphysema No suspicious nodules, massess No effusions No pneumonia  Previous compliance report  compliance report shared with patient AHI 0.1 100% compliance AUTOCPAP 7-18c hes  **12/2019 compliance report Excellent compliance report AHI down to 0.1  **07/09/2020 Excellent compliance report 100% for days Greater than 97% percent for over 4 hours Auto CPAP 7 to 18 cm water pressure AHI 0.1  **12/2020 Excellent compliance report 100% for days 97% greater than 4 hours AHI reduced to 0.2  CHIEF COMPLAINT:    Follow-up OSA Follow-up COPD     HISTORY OF PRESENT ILLNESS   COPD seems to be stable at this time  His shortness of breath and dyspnea exertion has progressively worsened since July 2022 when he was diagnosed with COVID-19 infection Exercise tolerance and capacity has diminished over time  No exacerbation at this time No evidence of heart failure at this time No evidence or signs of infection at this time No respiratory distress No fevers, chills, nausea, vomiting, diarrhea No evidence of lower extremity edema No evidence hemoptysis  Pulmonary function test reveals FEV1 24% predicted Very severe obstructive airways disease  Stays active  Plays golf  Diagnosed with COPD 1998  Seems to be stable on Symbicort and Spiriva  Patient has tried Trelegy in [place] inhaler  Patient currently weighs 198 pounds He has gained weight over the last couple years Recommend weight loss   OSA Recommend repeat compliance report      Current Medication:  Current Outpatient Medications:     albuterol (VENTOLIN HFA) 108 (90 Base) MCG/ACT inhaler, USE 2 INHALATIONS BY MOUTH  EVERY 6 HOURS AS NEEDED FOR WHEEZING, Disp: 34 g, Rfl: 3   budesonide-formoterol (SYMBICORT) 160-4.5 MCG/ACT inhaler, Inhale 2 puffs into the lungs 2 (two) times daily., Disp: 3 each, Rfl: 3   ipratropium-albuterol (DUONEB) 0.5-2.5 (3) MG/3ML SOLN, Take 3 mLs by nebulization every 4 (four) hours as needed., Disp: 360 mL, Rfl: 3   levothyroxine (SYNTHROID, LEVOTHROID) 112 MCG tablet, Take 112 mcg by mouth daily before breakfast., Disp: , Rfl:    methylPREDNISolone (MEDROL DOSEPAK) 4 MG TBPK tablet, Take as directed on package, Disp: 1 each, Rfl: 0   montelukast (SINGULAIR) 10 MG tablet, TAKE 1 TABLET BY MOUTH  DAILY, Disp: 90 tablet, Rfl: 3   rosuvastatin (CRESTOR) 10 MG tablet, Take 10 mg by mouth daily., Disp: , Rfl:    Tiotropium Bromide Monohydrate (SPIRIVA RESPIMAT) 2.5 MCG/ACT AERS, Inhale 2 puffs into the lungs daily., Disp: 12 g, Rfl: 3   vitamin B-12 (CYANOCOBALAMIN) 1000 MCG tablet, Take 1,000 mcg by mouth daily., Disp: , Rfl:    Vitamin D, Cholecalciferol, 10 MCG (400 UNIT) TABS, Take by mouth., Disp: , Rfl:    oxybutynin (DITROPAN-XL) 5 MG 24 hr tablet, Take 5 mg by mouth daily., Disp: , Rfl:     ALLERGIES   Penicillins     REVIEW OF SYSTEMS   Review of Systems  Constitutional:  Negative for chills, diaphoresis, fever, malaise/fatigue and weight loss.  HENT:  Positive for congestion. Negative for hearing loss.   Respiratory:  Positive for cough, sputum production, shortness of breath and wheezing. Negative for hemoptysis.   Cardiovascular:  Negative for chest pain, palpitations, orthopnea and leg swelling.  Gastrointestinal:  Negative for heartburn and nausea.  Skin:  Negative for rash.  Neurological:  Negative for weakness and headaches.  Psychiatric/Behavioral:  Negative for depression.   All other systems reviewed and are negative. BP 116/72 (BP Location: Left Arm, Cuff Size: Normal)    Pulse 77   Temp 97.7 F (36.5 C) (Temporal)   Ht '5\' 8"'$  (1.727 m)   Wt 198 lb 6.4 oz (90 kg)   SpO2 96%   BMI 30.17 kg/m       Physical Examination:   General Appearance: No distress  EYES PERRLA, EOM intact.   NECK Supple, No JVD Pulmonary: normal breath sounds, No wheezing.  CardiovascularNormal S1,S2.  No m/r/g.     CT chest 3.15.19 Emphysematous changes, pulmonary scarring and patchy areas of peribronchial thickening and minimal bronchiectasis. No suspicious nodules, masses   ASSESSMENT/PLAN   66 year old pleasant white male seen today for follow-up severe end-stage COPD FEV1 24% predicted Gold stage D associated with chronic respiratory insufficiency and underlying sleep apnea    At this time I believe do believe his COPD has progressed due to the fact that he had COVID-19 infection and based on his symptoms of progressive diminished exercise capacity and tolerance over the last 8 months  Patient has tried several different inhaler therapy and will continue with the current therapy at this time   Chronic respiratory insufficiency with COPD and COVID-19 infection  There is a possibility he may need nebulized therapy for ongoing treatment as traditional inhaler therapy may not be ideal   Severe COPD Gold stage D No indication of prednisone or antibiotics at this time Continue inhalers as prescribed   OSA Compliance report needed to be assessed and addressed at next visit  Respiratory insufficiency with shortness of breath dyspnea exertion Related to COPD No indication for oxygen at this time based on 6-minute walk test in May 2022 Patient may need nebulized therapy as mainstay therapy  Obesity -recommend significant weight loss -recommend changing diet  Deconditioned state -Recommend increased daily activity and exercise   MEDICATION ADJUSTMENTS/LABS AND TESTS ORDERED:  Continue CPAP as prescribed LUNG CANCER SCREENING REFERRAL PROGRAM Continue  inhalers as prescribed No indication for oxygen at this time Continue CPAP as prescribed   CURRENT MEDICATIONS REVIEWED AT LENGTH WITH PATIENT TODAY   Patient satisfied with Plan of action and management. All questions answered  Follow-up in 3 months  Total time spent 35 minutes  [name], M.D.  [facility]  Medical Director [place] Director [facility] Cardio-Pulmonary Department

## 2021-09-14 ENCOUNTER — Telehealth: Payer: Self-pay | Admitting: Internal Medicine

## 2021-09-14 ENCOUNTER — Encounter: Payer: Self-pay | Admitting: Internal Medicine

## 2021-09-14 NOTE — Telephone Encounter (Signed)
Patient brought by SD card for download.  Download has been printed and placed to Dr. Zoila Shutter folder for review.   Dr. Mortimer Fries, please advise. Thanks

## 2021-09-15 NOTE — Telephone Encounter (Signed)
Dr. Kasa, please advise. Thanks °

## 2021-09-16 ENCOUNTER — Ambulatory Visit
Admission: RE | Admit: 2021-09-16 | Discharge: 2021-09-16 | Disposition: A | Discharge status: Home / Self Care | Payer: Medicare Other | Source: Ambulatory Visit | Attending: Internal Medicine | Admitting: Internal Medicine

## 2021-09-16 DIAGNOSIS — K7469 Other cirrhosis of liver: Secondary | ICD-10-CM | POA: Diagnosis present

## 2021-09-23 NOTE — Telephone Encounter (Signed)
Dr. Kasa, please advise. Thanks °

## 2021-10-17 ENCOUNTER — Other Ambulatory Visit: Payer: Self-pay | Admitting: Primary Care

## 2021-10-20 ENCOUNTER — Other Ambulatory Visit (HOSPITAL_BASED_OUTPATIENT_CLINIC_OR_DEPARTMENT_OTHER): Payer: Medicare Other | Admitting: Internal Medicine

## 2021-10-27 ENCOUNTER — Other Ambulatory Visit (HOSPITAL_BASED_OUTPATIENT_CLINIC_OR_DEPARTMENT_OTHER): Payer: Medicare Other | Admitting: Internal Medicine

## 2021-12-09 ENCOUNTER — Encounter: Payer: Self-pay | Admitting: Internal Medicine

## 2021-12-09 ENCOUNTER — Ambulatory Visit (INDEPENDENT_AMBULATORY_CARE_PROVIDER_SITE_OTHER): Payer: Medicare Other | Admitting: Internal Medicine

## 2021-12-09 ENCOUNTER — Telehealth: Payer: Self-pay

## 2021-12-09 VITALS — BP 120/70 | HR 66 | Temp 97.8°F | Ht 68.0 in | Wt 195.8 lb

## 2021-12-09 DIAGNOSIS — R0689 Other abnormalities of breathing: Secondary | ICD-10-CM

## 2021-12-09 DIAGNOSIS — J449 Chronic obstructive pulmonary disease, unspecified: Secondary | ICD-10-CM

## 2021-12-09 DIAGNOSIS — G4733 Obstructive sleep apnea (adult) (pediatric): Secondary | ICD-10-CM

## 2021-12-09 NOTE — Progress Notes (Signed)
David [name]      Date: 12/09/2021,   MRN# [id number] David [name] October 23, 1955  David [name] is a 66 y.o. old male seen in [name] for COPD   Synopsis estabished care in 2017 for Severe COPD PFT's 10/2015 Fev1 28% 1.03L  6MWT WNL 95% o2 sat at 1083 feet ONO +for hypoxia on oxygen  CT chest 3.15.19 Reviewed with patient +emphysema No suspicious nodules, massess No effusions No pneumonia  Previous compliance report  compliance report shared with patient AHI 0.1 100% compliance AUTOCPAP 7-18c hes  **12/2019 compliance report Excellent compliance report AHI down to 0.1  **07/09/2020 Excellent compliance report 100% for days Greater than 97% percent for over 4 hours Auto CPAP 7 to 18 cm water pressure AHI 0.1  **12/2020 Excellent compliance report 100% for days 97% greater than 4 hours AHI reduced to 0.2  CHIEF COMPLAINT:    Follow-up OSA Follow-up COPD     HISTORY OF PRESENT ILLNESS   COPD seems to be stable at this time  His shortness of breath and dyspnea exertion has progressively worsened since July 2022 when he was diagnosed with COVID-19 infection Exercise tolerance and capacity has diminished over time  No exacerbation at this time No evidence of heart failure at this time No evidence or signs of infection at this time No respiratory distress No fevers, chills, nausea, vomiting, diarrhea No evidence of lower extremity edema No evidence hemoptysis   Pulmonary function test reveals FEV1 24% predicted Very severe obstructive airways disease  Stays active  Plays golf  Diagnosed with COPD 1998  Seems to be stable on Symbicort and Spiriva  Patient has tried Trelegy in [place] inhaler   OSA under control Excellent Compliance reports 100% days and >4 hrs   Patient currently weighs 195 pounds He has gained weight over the last couple years Recommend weight loss 10 pounds goal weight loss at next  OV      Current Medication:  Current Outpatient Medications:    albuterol (VENTOLIN HFA) 108 (90 Base) MCG/ACT inhaler, USE 2 INHALATIONS BY MOUTH  EVERY 6 HOURS AS NEEDED FOR WHEEZING, Disp: 34 g, Rfl: 3   ipratropium-albuterol (DUONEB) 0.5-2.5 (3) MG/3ML SOLN, Take 3 mLs by nebulization every 4 (four) hours as needed., Disp: 360 mL, Rfl: 3   levothyroxine (SYNTHROID, LEVOTHROID) 112 MCG tablet, Take 112 mcg by mouth daily before breakfast., Disp: , Rfl:    methylPREDNISolone (MEDROL DOSEPAK) 4 MG TBPK tablet, Take as directed on package, Disp: 1 each, Rfl: 0   montelukast (SINGULAIR) 10 MG tablet, TAKE 1 TABLET BY MOUTH  DAILY, Disp: 90 tablet, Rfl: 3   rosuvastatin (CRESTOR) 10 MG tablet, Take 10 mg by mouth daily., Disp: , Rfl:    SPIRIVA RESPIMAT 2.5 MCG/ACT AERS, USE 2 INHALATIONS BY MOUTH  DAILY, Disp: 12 g, Rfl: 3   SYMBICORT 160-4.5 MCG/ACT inhaler, USE 2 INHALATIONS BY MOUTH  TWICE DAILY, Disp: 30.6 g, Rfl: 3   vitamin B-12 (CYANOCOBALAMIN) 1000 MCG tablet, Take 1,000 mcg by mouth daily., Disp: , Rfl:    Vitamin D, Cholecalciferol, 10 MCG (400 UNIT) TABS, Take by mouth., Disp: , Rfl:    oxybutynin (DITROPAN-XL) 5 MG 24 hr tablet, Take 5 mg by mouth daily., Disp: , Rfl:     ALLERGIES   Penicillins       Review of Systems: Gen:  Denies  fever, sweats, chills weight loss  HEENT: Denies blurred vision, double vision, ear pain, eye  pain, hearing loss, nose bleeds, sore throat Cardiac:  No dizziness, chest pain or heaviness, chest tightness,edema, No JVD Resp:   No cough, -sputum production, +shortness of breath,-wheezing, -hemoptysis,  Other:  All other systems negative   BP 120/70 (BP Location: Left Arm, Cuff Size: Normal)   Pulse 66   Temp 97.8 F (36.6 C) (Temporal)   Ht '5\' 8"'$  (1.727 m)   Wt 195 lb 12.8 oz (88.8 kg)   SpO2 97%   BMI 29.77 kg/m   Physical Examination:   General Appearance: No distress  EYES PERRLA, EOM intact.   NECK Supple, No  JVD Pulmonary: normal breath sounds, No wheezing.  CardiovascularNormal S1,S2.  No m/r/g.   Abdomen: Benign, Soft, non-tender. ALL OTHER ROS ARE NEGATIVE     CT chest 3.15.19 Emphysematous changes, pulmonary scarring and patchy areas of peribronchial thickening and minimal bronchiectasis. No suspicious nodules, masses   ASSESSMENT/PLAN   66 year old pleasant white male seen today for follow-up severe end-stage COPD FEV1 24% predicted Gold stage D associated with chronic respiratory insufficiency and underlying sleep apnea    SEVERE COPD NO infections Continue inhalers as precribed    OSA Excellent compliance report 100% days and >4 hrs usage Well controlled with CPAP therapy  Respiratory insufficiency with shortness of breath dyspnea exertion Related to COPD No indication for oxygen at this time based on 6-minute walk test in May 2022  Obesity -recommend significant weight loss -recommend changing diet Weight loss goal of 10 pounds in 6 months   Deconditioned state -Recommend increased daily activity and exercise     MEDICATION ADJUSTMENTS/LABS AND TESTS ORDERED: Continue CPAP as prescribed LUNG CANCER SCREENING REFERRAL PROGRAM Continue inhalers as prescribed No indication for oxygen at this time    Patient satisfied with Plan of action and management. All questions answered  Follow up in 6 months  Total time spent 25 mins  Maretta Bees Patricia Pesa, M.D.  [facility]  Medical Director [place] Director [facility] Cardio-Pulmonary Department

## 2021-12-09 NOTE — Telephone Encounter (Signed)
Patient in office. He states that he was told by [facility] that he does not qualify for replacement cpap, due to non compliance with his machine. According to our records, patient has been compliant. ATC [facility] and was placed on hold for >20mn.  ARodena Piety can you help with this? Thanks

## 2021-12-09 NOTE — Telephone Encounter (Signed)
I have received confirmation that 17 pages were sent successfully to [facility]

## 2021-12-09 NOTE — Patient Instructions (Signed)
Continue CPAP as prescribed EXCELLENT JOB A+!!!!! Continue inhalers as prescribed Weight loss Goal for next 6 months is 10 pounds

## 2021-12-09 NOTE — Telephone Encounter (Signed)
I spoke with David [name] with [facility] and he stated that he didn't have any notes stating the patient was compliant.  I ask if [facility] still has access to Epic and he stated that Forde Radon does.  He ask how would they be notified to check Epic for notes. I told him I have no idea what happens on their end. I have faxed the last to notes to [facility]

## 2021-12-13 ENCOUNTER — Encounter: Payer: Self-pay | Admitting: Internal Medicine

## 2021-12-14 NOTE — Telephone Encounter (Signed)
Lm x1 for Erica with cpap supply company.

## 2022-04-27 ENCOUNTER — Other Ambulatory Visit: Payer: Self-pay | Admitting: Gastroenterology

## 2022-04-27 DIAGNOSIS — K7460 Unspecified cirrhosis of liver: Secondary | ICD-10-CM

## 2022-05-04 ENCOUNTER — Encounter: Payer: Self-pay | Admitting: Internal Medicine

## 2022-05-05 ENCOUNTER — Ambulatory Visit
Admission: RE | Admit: 2022-05-05 | Discharge: 2022-05-05 | Disposition: A | Discharge status: Home / Self Care | Payer: Medicare Other | Source: Ambulatory Visit | Attending: Gastroenterology | Admitting: Gastroenterology

## 2022-05-05 DIAGNOSIS — K7460 Unspecified cirrhosis of liver: Secondary | ICD-10-CM | POA: Diagnosis not present

## 2022-05-05 MED ORDER — IOHEXOL 300 MG/ML  SOLN
100.0000 mL | Freq: Once | INTRAMUSCULAR | Status: AC | PRN
  Administered 2022-05-05: 100 mL via INTRAVENOUS

## 2022-05-10 ENCOUNTER — Encounter: Payer: Self-pay | Admitting: Cardiology

## 2022-05-10 ENCOUNTER — Telehealth: Payer: Self-pay

## 2022-05-10 ENCOUNTER — Other Ambulatory Visit
Admission: RE | Admit: 2022-05-10 | Discharge: 2022-05-10 | Disposition: A | Discharge status: Home / Self Care | Payer: Medicare Other | Source: Ambulatory Visit | Attending: Cardiology | Admitting: Cardiology

## 2022-05-10 ENCOUNTER — Ambulatory Visit: Payer: Medicare Other | Attending: Cardiology | Admitting: Cardiology

## 2022-05-10 VITALS — BP 122/68 | HR 73 | Ht 68.0 in | Wt 196.4 lb

## 2022-05-10 DIAGNOSIS — E7800 Pure hypercholesterolemia, unspecified: Secondary | ICD-10-CM | POA: Insufficient documentation

## 2022-05-10 DIAGNOSIS — R911 Solitary pulmonary nodule: Secondary | ICD-10-CM

## 2022-05-10 DIAGNOSIS — I2089 Other forms of angina pectoris: Secondary | ICD-10-CM | POA: Diagnosis not present

## 2022-05-10 DIAGNOSIS — R0609 Other forms of dyspnea: Secondary | ICD-10-CM | POA: Insufficient documentation

## 2022-05-10 LAB — BASIC METABOLIC PANEL
Anion gap: 8 (ref 5–15)
BUN: 16 mg/dL (ref 8–23)
CO2: 29 mmol/L (ref 22–32)
Calcium: 8.9 mg/dL (ref 8.9–10.3)
Chloride: 103 mmol/L (ref 98–111)
Creatinine, Ser: 0.95 mg/dL (ref 0.61–1.24)
GFR, Estimated: >60 mL/min (ref >60)
Glucose, Bld: 93 mg/dL (ref 70–99)
Potassium: 4.1 mmol/L (ref 3.5–5.1)
Sodium: 140 mmol/L (ref 135–145)

## 2022-05-10 MED ORDER — METOPROLOL TARTRATE 100 MG PO TABS: 100.0000 mg | ORAL_TABLET | Freq: Once | ORAL | 0 refills | Status: DC

## 2022-05-10 MED ORDER — IVABRADINE HCL 5 MG PO TABS: 10.0000 mg | ORAL_TABLET | Freq: Once | ORAL | 0 refills | Status: AC

## 2022-05-10 NOTE — Patient Instructions (Signed)
Medication Instructions:   Your physician recommends that you continue on your current medications as directed. Please refer to the Current Medication list given to you today.  *If you need a refill on your cardiac medications before your next appointment, please call your pharmacy*   Lab Work:  Your physician recommends you go to the medical mall for lab work.  If you have labs (blood work) drawn today and your tests are completely normal, you will receive your results only by: [place] (if you have MyChart) OR A paper copy in the mail If you have any lab test that is abnormal or we need to change your treatment, we will call you to review the results.   Testing/Procedures:  Your physician has requested that you have an echocardiogram. Echocardiography is a painless test that uses sound waves to create images of your heart. It provides your doctor with information about the size and shape of your heart and how well your heart's chambers and valves are working. This procedure takes approximately one hour. There are no restrictions for this procedure. Please do NOT wear cologne, perfume, aftershave, or lotions (deodorant is allowed). Please arrive 15 minutes prior to your appointment time.     2. Your cardiac CT will be scheduled at  [facility] [address] [place],  [address] [phone]  If scheduled at [facility] or [facility], please arrive 15 mins early for check-in and test prep.   Please follow these instructions carefully (unless otherwise directed):  Hold all erectile dysfunction medications at least 3 days (72 hrs) prior to test. (Ie viagra, cialis, sildenafil, tadalafil, etc) We will administer nitroglycerin during this exam.   On the Night Before the Test: Be sure to Drink plenty of water. Do not consume any caffeinated/decaffeinated beverages or  chocolate 12 hours prior to your test. Do not take any antihistamines 12 hours prior to your test.  On the Day of the Test: Drink plenty of water until 1 hour prior to the test. Do not eat any food 1 hour prior to test. You may take your regular medications prior to the test.  Take metoprolol (Lopressor) two hours prior to test. Take Corlanor two hours prior to test      After the Test: Drink plenty of water. After receiving IV contrast, you may experience a mild flushed feeling. This is normal. On occasion, you may experience a mild rash up to 24 hours after the test. This is not dangerous. If this occurs, you can take Benadryl 25 mg and increase your fluid intake. If you experience trouble breathing, this can be serious. If it is severe call 911 IMMEDIATELY. If it is mild, please call our office.  For scheduling needs, including cancellations and rescheduling, please call [place], [phone].  Follow-Up: At [facility], you and your health needs are our priority.  As part of our continuing mission to provide you with exceptional heart care, we have created designated Provider Care Teams.  These Care Teams include your primary Cardiologist (physician) and Advanced Practice Providers (APPs -  Physician Assistants and Nurse Practitioners) who all work together to provide you with the care you need, when you need it.  We recommend signing up for the patient portal called "MyChart".  Sign up information is provided on this After Visit Summary.  MyChart is used to connect with patients for Virtual Visits (Telemedicine).  Patients are able to view lab/test results,  encounter notes, upcoming appointments, etc.  Non-urgent messages can be sent to your provider as well.   To learn more about what you can do with MyChart, go to [email or web address].    Your next appointment:    6-8 weeks   Provider:   You may see Kate Sable, MD or one of the following Advanced Practice  Providers on your designated Care Team:   Murray Hodgkins, NP Christell Faith, PA-C Cadence Kathlen Mody, PA-C Gerrie Nordmann, NP

## 2022-05-10 NOTE — Telephone Encounter (Signed)
I called and spoke with the patient. He said he received a letter in the mail from his insurance company saying they needed more information faxed to them to get the Symbicort approved.  He said the letter states we have 14 days to reply or they will close the case.  Please check into this. Thank you!

## 2022-05-10 NOTE — Progress Notes (Signed)
Cardiology Office Note:    Date:  05/10/2022   ID:  David [name], DOB 1955-07-19, MRN [id number]  PCP:  Adin Hector, MD  [facility] Cardiologist:  Kate Sable, MD  [facility] Electrophysiologist:  None   Referring MD: Adin Hector, MD   Chief Complaint  Patient presents with   Follow-up    12 month F/u, SOB doesn't feel like issue is the COPD    History of Present Illness:    David [name] is a 67 y.o. male with a hx of hyperlipidemia, former smoker x15 years, COPD who due to worsening shortness of breath on exertion.    Patient plays golf, states getting out of breath with minimal exertion.  Symptoms seem to have worsened over the past 6 months.  Denies chest pain.  Deviously seen about 2 to 3 years ago for chest pain, workup with echo and coronary CT was unrevealing.  Per pulmonary note, he has severe COPD.  He does not think his COPD is the issue with his worsening shortness of breath.  Prior notes Echo 32/4401 normal systolic function, EF 50 to 02%, normal diastolic function. Coronary CTA 01/2020 calcium score of 0, no evidence for CAD.   Past Medical History:  Diagnosis Date   B12 deficiency    Complete tear of right rotator cuff    Compression fracture of spine (HCC)    COPD (chronic obstructive pulmonary disease) (HCC)    DDD (degenerative disc disease), lumbar    H/O emphysema (HCC)    Hyperlipidemia    Hypothyroidism    Lung nodule, multiple    3MM. AND 8 MM   Nocturnal hypoxia    Pneumonia    Shortness of breath on exertion    Sleep apnea    Thrombocytopenia (HCC)    Thyroid disease     Past Surgical History:  Procedure Laterality Date   ANTERIOR CERVICAL DECOMP/DISCECTOMY FUSION N/A 08/20/2019   Procedure: ANTERIOR CERVICAL DECOMPRESSION/DISCECTOMY FUSION 2 LEVELS C4/5, C5/6;  Surgeon: [name], MD;  Location: [facility];  Service: Neurosurgery;  Laterality: N/A;   BACK SURGERY  1991   L5-S1   COLONOSCOPY     ELBOW  ARTHROSCOPY Right    ESOPHAGOGASTRODUODENOSCOPY (EGD) WITH PROPOFOL N/A 09/26/2017   Procedure: ESOPHAGOGASTRODUODENOSCOPY (EGD) WITH PROPOFOL;  Surgeon: Lin Landsman, MD;  Location: [facility];  Service: Gastroenterology;  Laterality: N/A;   HEMORRHOID SURGERY  1982   KYPHOPLASTY N/A 06/10/2016   Procedure: KYPHOPLASTY T12;  Surgeon: Hessie Knows, MD;  Location: [facility];  Service: Orthopedics;  Laterality: N/A;   KYPHOPLASTY     T12   SHOULDER ARTHROSCOPY     TENODESIS LONG TENDON BICEPS Right    TONSILLECTOMY     WISDOM TOOTH EXTRACTION  1982    Current Medications: Current Meds  Medication Sig   albuterol (VENTOLIN HFA) 108 (90 Base) MCG/ACT inhaler USE 2 INHALATIONS BY MOUTH  EVERY 6 HOURS AS NEEDED FOR WHEEZING   ipratropium-albuterol (DUONEB) 0.5-2.5 (3) MG/3ML SOLN Take 3 mLs by nebulization every 4 (four) hours as needed.   ivabradine (CORLANOR) 5 MG TABS tablet Take 2 tablets (10 mg total) by mouth once for 1 dose. TWO HOURS PRIOR TO CARDIAC CTA   levothyroxine (SYNTHROID, LEVOTHROID) 112 MCG tablet Take 112 mcg by mouth daily before breakfast.   metoprolol tartrate (LOPRESSOR) 100 MG tablet Take 1 tablet (100 mg total) by mouth once for 1 dose. TWO HOURS PRIOR TO CARDIAC CTA  montelukast (SINGULAIR) 10 MG tablet TAKE 1 TABLET BY MOUTH  DAILY   rosuvastatin (CRESTOR) 10 MG tablet Take 10 mg by mouth daily.   SPIRIVA RESPIMAT 2.5 MCG/ACT AERS USE 2 INHALATIONS BY MOUTH  DAILY   SYMBICORT 160-4.5 MCG/ACT inhaler USE 2 INHALATIONS BY MOUTH  TWICE DAILY   vitamin B-12 (CYANOCOBALAMIN) 1000 MCG tablet Take 1,000 mcg by mouth daily.   Vitamin D, Cholecalciferol, 10 MCG (400 UNIT) TABS Take by mouth.     Allergies:   Penicillins   Social History   Socioeconomic History   Marital status: Married    Spouse name: Not on file   Number of children: Not on file   Years of education: Not on file   Highest education level: Not on file  Occupational History   Not on file   Tobacco Use   Smoking status: Former    Packs/day: 1.00    Years: 13.00    Total pack years: 13.00    Types: Cigarettes    Quit date: 06/09/1984    Years since quitting: 37.9   Smokeless tobacco: Never  Vaping Use   Vaping Use: Never used  Substance and Sexual Activity   Alcohol use: Yes    Comment: 2-3 beers week   Drug use: No   Sexual activity: Not on file  Other Topics Concern   Not on file  Social History Narrative   Not on file   Social Determinants of Health   Financial Resource Strain: Not on file  Food Insecurity: Not on file  Transportation Needs: Not on file  Physical Activity: Not on file  Stress: Not on file  Social Connections: Not on file     Family History: The patient's family history includes Heart disease in his father.  ROS:   Please see the history of present illness.     All other systems reviewed and are negative.  EKGs/Labs/Other Studies Reviewed:    The following studies were reviewed today:   EKG:  EKG is  ordered today.  The ekg ordered today demonstrates normal sinus rhythm, normal ECG.  Recent Labs: No results found for requested labs within last 365 days.  Recent Lipid Panel No results found for: "CHOL", "TRIG", "HDL", "CHOLHDL", "VLDL", "LDLCALC", "LDLDIRECT"   Risk Assessment/Calculations:      Physical Exam:    VS:  BP 122/68 (BP Location: Left Arm, Patient Position: Sitting, Cuff Size: Normal)   Pulse 73   Ht '5\' 8"'$  (1.727 m)   Wt 196 lb 6.4 oz (89.1 kg)   SpO2 95%   BMI 29.86 kg/m     Wt Readings from Last 3 Encounters:  05/10/22 196 lb 6.4 oz (89.1 kg)  12/09/21 195 lb 12.8 oz (88.8 kg)  09/09/21 198 lb 6.4 oz (90 kg)     GEN:  Well nourished, well developed in no acute distress HEENT: Normal NECK: No JVD; No carotid bruits CARDIAC: RRR, no murmurs, rubs, gallops RESPIRATORY: Diminished breath sounds bilaterally, no wheezing ABDOMEN: Soft, non-tender, non-distended MUSCULOSKELETAL:  No edema; No deformity   SKIN: Warm and dry NEUROLOGIC:  Alert and oriented x 3 PSYCHIATRIC:  Normal affect   ASSESSMENT:    1. Dyspnea on exertion   2. Pure hypercholesterolemia   3. Anginal equivalent     PLAN:    In order of problems listed above:  Dyspnea on exertion, possible anginal equivalent.  COPD may be contributing.  Previous cardiac workup about 3 years ago.  Get echo, get coronary  CTA. Hyperlipidemia, cholesterol controlled continue Crestor.   Follow-up after cardiac testing  Total encounter time more 40 minutes  Greater than 50% was spent in counseling and coordination of care with the patient    Medication Adjustments/Labs and Tests Ordered: Current medicines are reviewed at length with the patient today.  Concerns regarding medicines are outlined above.  Orders Placed This Encounter  Procedures   CT CORONARY MORPH W/CTA COR W/SCORE W/CA W/CM &/OR WO/CM   Basic Metabolic Panel (BMET)   EKG 12-Lead   ECHOCARDIOGRAM COMPLETE    Meds ordered this encounter  Medications   metoprolol tartrate (LOPRESSOR) 100 MG tablet    Sig: Take 1 tablet (100 mg total) by mouth once for 1 dose. TWO HOURS PRIOR TO CARDIAC CTA    Dispense:  1 tablet    Refill:  0   ivabradine (CORLANOR) 5 MG TABS tablet    Sig: Take 2 tablets (10 mg total) by mouth once for 1 dose. TWO HOURS PRIOR TO CARDIAC CTA    Dispense:  2 tablet    Refill:  0     Patient Instructions  Medication Instructions:   Your physician recommends that you continue on your current medications as directed. Please refer to the Current Medication list given to you today.  *If you need a refill on your cardiac medications before your next appointment, please call your pharmacy*   Lab Work:  Your physician recommends you go to the medical mall for lab work.  If you have labs (blood work) drawn today and your tests are completely normal, you will receive your results only by: [place] (if you have MyChart) OR A paper  copy in the mail If you have any lab test that is abnormal or we need to change your treatment, we will call you to review the results.   Testing/Procedures:  Your physician has requested that you have an echocardiogram. Echocardiography is a painless test that uses sound waves to create images of your heart. It provides your doctor with information about the size and shape of your heart and how well your heart's chambers and valves are working. This procedure takes approximately one hour. There are no restrictions for this procedure. Please do NOT wear cologne, perfume, aftershave, or lotions (deodorant is allowed). Please arrive 15 minutes prior to your appointment time.     2. Your cardiac CT will be scheduled at  [facility] [address] [place], [place] [address] [phone]  If scheduled at [facility] or [facility], please arrive 15 mins early for check-in and test prep.   Please follow these instructions carefully (unless otherwise directed):  Hold all erectile dysfunction medications at least 3 days (72 hrs) prior to test. (Ie viagra, cialis, sildenafil, tadalafil, etc) We will administer nitroglycerin during this exam.   On the Night Before the Test: Be sure to Drink plenty of water. Do not consume any caffeinated/decaffeinated beverages or chocolate 12 hours prior to your test. Do not take any antihistamines 12 hours prior to your test.  On the Day of the Test: Drink plenty of water until 1 hour prior to the test. Do not eat any food 1 hour prior to test. You may take your regular medications prior to the test.  Take metoprolol (Lopressor) two hours prior to test. Take Corlanor two hours prior to test      After the Test: Drink plenty of water. After receiving IV  contrast, you may experience a mild flushed feeling. This is normal. On occasion, you may experience a mild  rash up to 24 hours after the test. This is not dangerous. If this occurs, you can take Benadryl 25 mg and increase your fluid intake. If you experience trouble breathing, this can be serious. If it is severe call 911 IMMEDIATELY. If it is mild, please call our office.  For scheduling needs, including cancellations and rescheduling, please call [place], [phone].  Follow-Up: At [facility], you and your health needs are our priority.  As part of our continuing mission to provide you with exceptional heart care, we have created designated Provider Care Teams.  These Care Teams include your primary Cardiologist (physician) and Advanced Practice Providers (APPs -  Physician Assistants and Nurse Practitioners) who all work together to provide you with the care you need, when you need it.  We recommend signing up for the patient portal called "MyChart".  Sign up information is provided on this After Visit Summary.  MyChart is used to connect with patients for Virtual Visits (Telemedicine).  Patients are able to view lab/test results, encounter notes, upcoming appointments, etc.  Non-urgent messages can be sent to your provider as well.   To learn more about what you can do with MyChart, go to [email or web address].    Your next appointment:    6-8 weeks   Provider:   You may see Kate Sable, MD or one of the following Advanced Practice Providers on your designated Care Team:   Murray Hodgkins, NP Christell Faith, PA-C Cadence Kathlen Mody, PA-C Gerrie Nordmann, NP   Signed, Kate Sable, MD  05/10/2022 9:17 AM    [place]

## 2022-05-10 NOTE — Telephone Encounter (Signed)
I spoke with the patient today about his Symbicort prescription authorization (I have sent a separate messages to the [facility] team about the medication).  While on the phone with him he mentioned that he has not been feeling well. He said he is having SOB and not breathing well. He did see his Cardiologist today who has order an Echo and CT on him. He said when he moved here in 2017 he had been told he had a nodule on his lung. And then he had a follow up Xray or CT and was told that the nodule had not grown. He is wondering if the nodule is getting bigger and causing his breathing issues? He said he does not know if you need to order another Xray to check on this? His next appointment with you is on 06/18/2022.

## 2022-05-10 NOTE — Telephone Encounter (Signed)
I notified the patient. He is aware that Rodena Piety will be reaching out to him with an appt for the CT.  Nothing further needed.

## 2022-05-11 ENCOUNTER — Other Ambulatory Visit (HOSPITAL_COMMUNITY): Payer: Self-pay

## 2022-05-11 NOTE — Telephone Encounter (Signed)
Dr. Mortimer Fries, please advise. Do you want to keep him on Symbicort or change to one of the listed alternatives? When I spoke with the patient he said he was doing good on Symbicort and would like to stay on it if possible.

## 2022-05-11 NOTE — Telephone Encounter (Signed)
If appropriate, test claim for alternatives in the same class of ICS+LABA return the following:   Dulera  - PA required Breo   - $20 Advair Diskus   Generic  - $5.00   Brand   - $50  If would like to proceed with Symbicort PA, please advise.

## 2022-05-14 ENCOUNTER — Encounter (INDEPENDENT_AMBULATORY_CARE_PROVIDER_SITE_OTHER): Payer: Self-pay

## 2022-05-14 ENCOUNTER — Other Ambulatory Visit (HOSPITAL_COMMUNITY): Payer: Self-pay

## 2022-05-17 ENCOUNTER — Telehealth: Payer: Self-pay

## 2022-05-17 ENCOUNTER — Other Ambulatory Visit (HOSPITAL_COMMUNITY): Payer: Self-pay

## 2022-05-17 NOTE — Telephone Encounter (Signed)
PA request received via CMM for Budesonide-Formoterol Fumarate 160-4.5MCG/ACT aerosol  PA has been submitted to [employer] and is pending determination.   Key: [id number]

## 2022-05-20 NOTE — H&P (Signed)
Pre-Procedure H&P   Patient ID: DENARDO HIGGS III is a 67 y.o. male.  Gastroenterology Provider: [name], DO  PCP: Adin Hector, MD  Date: 05/21/2022  HPI Mr. BERNARR CRISTINA III is a 67 y.o. male who presents today for Esophagogastroduodenoscopy for Esophageal varices surveillance; cirrhosis .  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  Other autoimmune infectious workup has been negative in the past.  Last underwent EGD in June 2019 demonstrating small varices.  Nodularity noted at the GEJ with biopsies only demonstrated reflux esophagitis  Hemoglobin 14.5 MCV 104.6 platelets 120,000 creatinine 1.0  History of C-spine fusion.  Severe sleep apnea with nocturnal hypoxia.  Advanced COPD per pulmonology report   Past Medical History:  Diagnosis Date   B12 deficiency    Complete tear of right rotator cuff    Compression fracture of spine (HCC)    COPD (chronic obstructive pulmonary disease) (HCC)    DDD (degenerative disc disease), lumbar    H/O emphysema (HCC)    Hyperlipidemia    Hypothyroidism    Lung nodule, multiple    3MM. AND 8 MM   Nocturnal hypoxia    Pneumonia    Shortness of breath on exertion    Sleep apnea    Thrombocytopenia (HCC)    Thyroid disease     Past Surgical History:  Procedure Laterality Date   ANTERIOR CERVICAL DECOMP/DISCECTOMY FUSION N/A 08/20/2019   Procedure: ANTERIOR CERVICAL DECOMPRESSION/DISCECTOMY FUSION 2 LEVELS C4/5, C5/6;  Surgeon: [name], MD;  Location: [facility];  Service: Neurosurgery;  Laterality: N/A;   BACK SURGERY  1991   L5-S1   COLONOSCOPY     ELBOW ARTHROSCOPY Right    ESOPHAGOGASTRODUODENOSCOPY (EGD) WITH PROPOFOL N/A 09/26/2017   Procedure: ESOPHAGOGASTRODUODENOSCOPY (EGD) WITH PROPOFOL;  Surgeon: Lin Landsman, MD;  Location: [place];  Service: Gastroenterology;  Laterality: N/A;   HEMORRHOID SURGERY  1982   KYPHOPLASTY N/A 06/10/2016    Procedure: KYPHOPLASTY T12;  Surgeon: Hessie Knows, MD;  Location: [facility];  Service: Orthopedics;  Laterality: N/A;   KYPHOPLASTY     T12   SHOULDER ARTHROSCOPY     TENODESIS LONG TENDON BICEPS Right    TONSILLECTOMY     WISDOM TOOTH EXTRACTION  1982    Family History No h/o GI disease or malignancy  Review of Systems  Constitutional:  Negative for activity change, appetite change, chills, diaphoresis, fatigue, fever and unexpected weight change.  HENT:  Negative for trouble swallowing and voice change.   Respiratory:  Negative for shortness of breath and wheezing.   Cardiovascular:  Negative for chest pain, palpitations and leg swelling.  Gastrointestinal:  Positive for constipation. Negative for abdominal distention, abdominal pain, anal bleeding, blood in stool, diarrhea, nausea, rectal pain and vomiting.  Musculoskeletal:  Negative for arthralgias and myalgias.  Skin:  Negative for color change and pallor.  Neurological:  Negative for dizziness, syncope and weakness.  Psychiatric/Behavioral:  Negative for confusion. The patient is not nervous/anxious.   All other systems reviewed and are negative.    Medications No current facility-administered medications on file prior to encounter.   Current Outpatient Medications on File Prior to Encounter  Medication Sig Dispense Refill   azelastine (ASTELIN) 0.1 % nasal spray Place into both nostrils 2 (two) times daily. Use in each nostril as directed     levothyroxine (SYNTHROID, LEVOTHROID) 112 MCG tablet Take 112 mcg by mouth daily before breakfast.  montelukast (SINGULAIR) 10 MG tablet TAKE 1 TABLET BY MOUTH  DAILY 90 tablet 3   oxybutynin (DITROPAN-XL) 5 MG 24 hr tablet Take 5 mg by mouth daily.     rosuvastatin (CRESTOR) 10 MG tablet Take 10 mg by mouth daily.     SPIRIVA RESPIMAT 2.5 MCG/ACT AERS USE 2 INHALATIONS BY MOUTH  DAILY 12 g 3   SYMBICORT 160-4.5 MCG/ACT inhaler USE 2 INHALATIONS BY MOUTH  TWICE DAILY 30.6 g 3    vitamin B-12 (CYANOCOBALAMIN) 1000 MCG tablet Take 1,000 mcg by mouth daily.     Vitamin D, Cholecalciferol, 10 MCG (400 UNIT) TABS Take by mouth.     albuterol (VENTOLIN HFA) 108 (90 Base) MCG/ACT inhaler USE 2 INHALATIONS BY MOUTH  EVERY 6 HOURS AS NEEDED FOR WHEEZING 34 g 3   ipratropium-albuterol (DUONEB) 0.5-2.5 (3) MG/3ML SOLN Take 3 mLs by nebulization every 4 (four) hours as needed. 360 mL 3   methylPREDNISolone (MEDROL DOSEPAK) 4 MG TBPK tablet Take as directed on package (Patient not taking: Reported on 05/10/2022) 1 each 0   metoprolol tartrate (LOPRESSOR) 100 MG tablet Take 1 tablet (100 mg total) by mouth once for 1 dose. TWO HOURS PRIOR TO CARDIAC CTA 1 tablet 0    Pertinent medications related to GI and procedure were reviewed by me with the patient prior to the procedure   Current Facility-Administered Medications:    0.9 %  sodium chloride infusion, , Intravenous, Continuous, [name], DO, Last Rate: 20 mL/hr at 05/21/22 1110, Continued from Pre-op at 05/21/22 1110      Allergies  Allergen Reactions   Penicillins Other (See Comments)    Unknown/ Childhood   Allergies were reviewed by me prior to the procedure  Objective   Body mass index is 29.8 kg/m. Vitals:   05/21/22 1123 05/21/22 1124  BP:  119/80  Pulse:  72  Resp:  20  Temp:  97.8 F (36.6 C)  TempSrc:  Temporal  SpO2:  97%  Weight: 89.1 kg 88.9 kg  Height:  5' 8"$  (1.727 m)     Physical Exam Vitals and nursing note reviewed.  Constitutional:      General: He is not in acute distress.    Appearance: Normal appearance. He is not ill-appearing, toxic-appearing or diaphoretic.  HENT:     Head: Normocephalic and atraumatic.     Nose: Nose normal.     Mouth/Throat:     Mouth: Mucous membranes are moist.     Pharynx: Oropharynx is clear.  Eyes:     General: No scleral icterus.    Extraocular Movements: Extraocular movements intact.  Cardiovascular:     Rate and Rhythm: Normal rate and  regular rhythm.     Heart sounds: Normal heart sounds. No murmur heard.    No friction rub. No gallop.  Pulmonary:     Effort: Pulmonary effort is normal. No respiratory distress.     Breath sounds: Normal breath sounds. No wheezing, rhonchi or rales.  Abdominal:     General: Bowel sounds are normal. There is no distension.     Palpations: Abdomen is soft.     Tenderness: There is no abdominal tenderness. There is no guarding or rebound.  Musculoskeletal:     Cervical back: Neck supple.     Right lower leg: No edema.     Left lower leg: No edema.  Skin:    General: Skin is warm and dry.     Coloration: Skin is not  jaundiced or pale.  Neurological:     General: No focal deficit present.     Mental Status: He is alert and oriented to person, place, and time. Mental status is at baseline.  Psychiatric:        Mood and Affect: Mood normal.        Behavior: Behavior normal.        Thought Content: Thought content normal.        Judgment: Judgment normal.      Assessment:  Mr. CONSTANCE ANCHETA III is a 67 y.o. male  who presents today for Esophagogastroduodenoscopy for Esophageal varices surveillance; cirrhosis .  Plan:  Esophagogastroduodenoscopy with possible intervention today  Esophagogastroduodenoscopy with possible biopsy, control of bleeding, polypectomy, and interventions as necessary has been discussed with the patient/patient representative. Informed consent was obtained from the patient/patient representative after explaining the indication, nature, and risks of the procedure including but not limited to death, bleeding, perforation, missed neoplasm/lesions, cardiorespiratory compromise, and reaction to medications. Opportunity for questions was given and appropriate answers were provided. Patient/patient representative has verbalized understanding is amenable to undergoing the procedure.   [name], DO  [facility] Gastroenterology  Portions of the record  may have been created with voice recognition software. Occasional wrong-word or 'sound-a-like' substitutions may have occurred due to the inherent limitations of voice recognition software.  Read the chart carefully and recognize, using context, where substitutions may have occurred.

## 2022-05-21 ENCOUNTER — Ambulatory Visit: Payer: Medicare Other | Admitting: Certified Registered"

## 2022-05-21 ENCOUNTER — Encounter
Admission: RE | Disposition: A | Discharge status: Home / Self Care | Payer: Self-pay | Source: Home / Self Care | Attending: Gastroenterology

## 2022-05-21 ENCOUNTER — Encounter: Payer: Self-pay | Admitting: Gastroenterology

## 2022-05-21 ENCOUNTER — Ambulatory Visit
Admission: RE | Admit: 2022-05-21 | Discharge: 2022-05-21 | Disposition: A | Discharge status: Home / Self Care | Payer: Medicare Other | Attending: Gastroenterology | Admitting: Gastroenterology

## 2022-05-21 DIAGNOSIS — E039 Hypothyroidism, unspecified: Secondary | ICD-10-CM | POA: Insufficient documentation

## 2022-05-21 DIAGNOSIS — K2970 Gastritis, unspecified, without bleeding: Secondary | ICD-10-CM | POA: Insufficient documentation

## 2022-05-21 DIAGNOSIS — J439 Emphysema, unspecified: Secondary | ICD-10-CM | POA: Diagnosis not present

## 2022-05-21 DIAGNOSIS — E785 Hyperlipidemia, unspecified: Secondary | ICD-10-CM | POA: Diagnosis not present

## 2022-05-21 DIAGNOSIS — G4734 Idiopathic sleep related nonobstructive alveolar hypoventilation: Secondary | ICD-10-CM | POA: Diagnosis not present

## 2022-05-21 DIAGNOSIS — K7030 Alcoholic cirrhosis of liver without ascites: Secondary | ICD-10-CM | POA: Insufficient documentation

## 2022-05-21 DIAGNOSIS — Z87891 Personal history of nicotine dependence: Secondary | ICD-10-CM | POA: Diagnosis not present

## 2022-05-21 DIAGNOSIS — E538 Deficiency of other specified B group vitamins: Secondary | ICD-10-CM | POA: Insufficient documentation

## 2022-05-21 DIAGNOSIS — K2100 Gastro-esophageal reflux disease with esophagitis, without bleeding: Secondary | ICD-10-CM | POA: Diagnosis not present

## 2022-05-21 DIAGNOSIS — K760 Fatty (change of) liver, not elsewhere classified: Secondary | ICD-10-CM | POA: Diagnosis not present

## 2022-05-21 DIAGNOSIS — G4730 Sleep apnea, unspecified: Secondary | ICD-10-CM | POA: Diagnosis not present

## 2022-05-21 HISTORY — PX: ESOPHAGOGASTRODUODENOSCOPY (EGD) WITH PROPOFOL: SHX5813

## 2022-05-21 SURGERY — ESOPHAGOGASTRODUODENOSCOPY (EGD) WITH PROPOFOL
Anesthesia: General

## 2022-05-21 MED ORDER — SODIUM CHLORIDE 0.9 % IV SOLN
INTRAVENOUS | Status: DC
  Administered 2022-05-21: 1000 mL via INTRAVENOUS

## 2022-05-21 MED ORDER — PROPOFOL 500 MG/50ML IV EMUL
INTRAVENOUS | Status: DC | PRN
  Administered 2022-05-21: 150 ug/kg/min via INTRAVENOUS

## 2022-05-21 MED ORDER — LIDOCAINE HCL (CARDIAC) PF 100 MG/5ML IV SOSY
PREFILLED_SYRINGE | INTRAVENOUS | Status: DC | PRN
  Administered 2022-05-21: 50 mg via INTRAVENOUS

## 2022-05-21 MED ORDER — PROPOFOL 10 MG/ML IV BOLUS
INTRAVENOUS | Status: DC | PRN
  Administered 2022-05-21: 50 mg via INTRAVENOUS

## 2022-05-21 NOTE — Anesthesia Preprocedure Evaluation (Addendum)
Anesthesia Evaluation  Patient identified by MRN, date of birth, ID band Patient awake    Reviewed: Allergy & Precautions, NPO status , Patient's Chart, lab work & pertinent test results  Airway Mallampati: III  TM Distance: <3 FB Neck ROM: Full    Dental  (+) Teeth Intact   Pulmonary neg pulmonary ROS, sleep apnea and Continuous Positive Airway Pressure Ventilation , COPD, former smoker   Pulmonary exam normal  + decreased breath sounds      Cardiovascular Exercise Tolerance: Good negative cardio ROS Normal cardiovascular exam Rhythm:Regular Rate:Normal     Neuro/Psych negative neurological ROS  negative psych ROS   GI/Hepatic negative GI ROS, Neg liver ROS,,,  Endo/Other  negative endocrine ROSHypothyroidism    Renal/GU negative Renal ROS  negative genitourinary   Musculoskeletal   Abdominal  (+) + obese  Peds negative pediatric ROS (+)  Hematology negative hematology ROS (+)   Anesthesia Other Findings Past Medical History: No date: B12 deficiency No date: Complete tear of right rotator cuff No date: Compression fracture of spine (HCC) No date: COPD (chronic obstructive pulmonary disease) (HCC) No date: DDD (degenerative disc disease), lumbar No date: H/O emphysema (HCC) No date: Hyperlipidemia No date: Hypothyroidism No date: Lung nodule, multiple     Comment:  3MM. AND 8 MM No date: Nocturnal hypoxia No date: Pneumonia No date: Shortness of breath on exertion No date: Sleep apnea No date: Thrombocytopenia ([place]) No date: Thyroid disease  Past Surgical History: 08/20/2019: ANTERIOR CERVICAL DECOMP/DISCECTOMY FUSION; N/A     Comment:  Procedure: ANTERIOR CERVICAL DECOMPRESSION/DISCECTOMY               FUSION 2 LEVELS C4/5, C5/6;  Surgeon: [name], MD;                Location: [facility];  Service: Neurosurgery;  Laterality:               N/A; 1991: BACK SURGERY     Comment:  L5-S1 No date:  COLONOSCOPY No date: ELBOW ARTHROSCOPY; Right 09/26/2017: ESOPHAGOGASTRODUODENOSCOPY (EGD) WITH PROPOFOL; N/A     Comment:  Procedure: ESOPHAGOGASTRODUODENOSCOPY (EGD) WITH               PROPOFOL;  Surgeon: Lin Landsman, MD;  Location:               [place];  Service: Gastroenterology;  Laterality:               N/A; 1982: HEMORRHOID SURGERY 06/10/2016: KYPHOPLASTY; N/A     Comment:  Procedure: KYPHOPLASTY T12;  Surgeon: Hessie Knows, MD;               Location: [facility];  Service: Orthopedics;  Laterality:               N/A; No date: KYPHOPLASTY     Comment:  T12 No date: SHOULDER ARTHROSCOPY No date: TENODESIS LONG TENDON BICEPS; Right No date: TONSILLECTOMY 1982: WISDOM TOOTH EXTRACTION     Reproductive/Obstetrics negative OB ROS                             Anesthesia Physical Anesthesia Plan  ASA: 3  Anesthesia Plan: General   Post-op Pain Management:    Induction: Intravenous  PONV Risk Score and Plan: Propofol infusion and TIVA  Airway Management Planned: Natural Airway  Additional Equipment:   Intra-op Plan:   Post-operative Plan:   Informed Consent: I  have reviewed the patients History and Physical, chart, labs and discussed the procedure including the risks, benefits and alternatives for the proposed anesthesia with the patient or authorized representative who has indicated his/her understanding and acceptance.     Dental Advisory Given  Plan Discussed with: CRNA and Surgeon  Anesthesia Plan Comments:        Anesthesia Quick Evaluation

## 2022-05-21 NOTE — Anesthesia Postprocedure Evaluation (Signed)
Anesthesia Post Note  Patient: David [name]  Procedure(s) Performed: ESOPHAGOGASTRODUODENOSCOPY (EGD) WITH PROPOFOL  Anesthesia Type: General Anesthetic complications: no   No notable events documented.   Last Vitals:  Vitals:   05/21/22 1144 05/21/22 1205  BP: 115/89 130/78  Pulse:  80  Resp: 20 20  Temp:    SpO2:      Last Pain:  Vitals:   05/21/22 1205  TempSrc:   PainSc: 0-No pain                 VAN STAVEREN,Corneilus Heggie

## 2022-05-21 NOTE — Transfer of Care (Signed)
Immediate Anesthesia Transfer of Care Note  Patient: David [name]  Procedure(s) Performed: ESOPHAGOGASTRODUODENOSCOPY (EGD) WITH PROPOFOL  Patient Location: PACU and Endoscopy Unit  Anesthesia Type:General  Level of Consciousness: awake, drowsy, and patient cooperative  Airway & Oxygen Therapy: Patient Spontanous Breathing  Post-op Assessment: Report given to RN and Post -op Vital signs reviewed and stable  Post vital signs: Reviewed and stable  Last Vitals:  Vitals Value Taken Time  BP 115/89 05/21/22 1144  Temp    Pulse 85 05/21/22 1144  Resp 25 05/21/22 1144  SpO2 99 % 05/21/22 1144  Vitals shown include unvalidated device data.  Last Pain:  Vitals:   05/21/22 1144  TempSrc:   PainSc: 0-No pain         Complications: No notable events documented.

## 2022-05-21 NOTE — Anesthesia Procedure Notes (Signed)
Procedure Name: MAC Date/Time: 05/21/2022 11:25 AM  Performed by: Jerrye Noble, CRNAPre-anesthesia Checklist: Patient identified, Emergency Drugs available, Suction available and Patient being monitored Patient Re-evaluated:Patient Re-evaluated prior to induction Oxygen Delivery Method: Simple face mask

## 2022-05-21 NOTE — Interval H&P Note (Signed)
History and Physical Interval Note: Preprocedure H&P from 05/21/22  was reviewed and there was no interval change after seeing and examining the patient.  Written consent was obtained from the patient after discussion of risks, benefits, and alternatives. Patient has consented to proceed with Esophagogastroduodenoscopy with possible intervention   05/21/2022 11:25 AM  David [name]  has presented today for surgery, with the diagnosis of K74.60 - Cirrhosis of liver without ascites, unspecified hepatic cirrhosis type.  The various methods of treatment have been discussed with the patient and family. After consideration of risks, benefits and other options for treatment, the patient has consented to  Procedure(s): ESOPHAGOGASTRODUODENOSCOPY (EGD) WITH PROPOFOL (N/A) as a surgical intervention.  The patient's history has been reviewed, patient examined, no change in status, stable for surgery.  I have reviewed the patient's chart and labs.  Questions were answered to the patient's satisfaction.     [name]

## 2022-05-21 NOTE — Op Note (Signed)
[facility] Gastroenterology Patient Name: David [name] [facility] Procedure Date: 05/21/2022 11:06 AM MRN: [id number] Account #: [id number] Date of Birth: [birth year] Admit Type: Outpatient Age: 67 Room: [facility] ENDO ROOM 1 Gender: Male Note Status: Finalized Instrument Name: Upper Endoscope [id number] Procedure:             Upper GI endoscopy Indications:           Surveillance procedure Providers:             Rueben Bash, DO Referring MD:          Ramonita Lab, MD (Referring MD) Medicines:             Monitored Anesthesia Care Complications:         No immediate complications. Estimated blood loss: None. Procedure:             Pre-Anesthesia Assessment:                        - Prior to the procedure, a History and Physical was                         performed, and patient medications and allergies were                         reviewed. The patient is competent. The risks and                         benefits of the procedure and the sedation options and                         risks were discussed with the patient. All questions                         were answered and informed consent was obtained.                         Patient identification and proposed procedure were                         verified by the physician, the nurse, the anesthetist                         and the technician in the endoscopy suite. Mental                         Status Examination: alert and oriented. Airway                         Examination: normal oropharyngeal airway and neck                         mobility. Respiratory Examination: clear to                         auscultation. CV Examination: RRR, no murmurs, no S3                         or S4. Prophylactic Antibiotics: The patient does not  require prophylactic antibiotics. Prior                         Anticoagulants: The patient has taken no anticoagulant                         or antiplatelet  agents. ASA Grade Assessment: III - A                         patient with severe systemic disease. After reviewing                         the risks and benefits, the patient was deemed in                         satisfactory condition to undergo the procedure. The                         anesthesia plan was to use monitored anesthesia care                         (MAC). Immediately prior to administration of                         medications, the patient was re-assessed for adequacy                         to receive sedatives. The heart rate, respiratory                         rate, oxygen saturations, blood pressure, adequacy of                         pulmonary ventilation, and response to care were                         monitored throughout the procedure. The physical                         status of the patient was re-assessed after the                         procedure.                        After obtaining informed consent, the endoscope was                         passed under direct vision. Throughout the procedure,                         the patient's blood pressure, pulse, and oxygen                         saturations were monitored continuously. The Endoscope                         was introduced through the mouth, and advanced to the  second part of duodenum. The upper GI endoscopy was                         accomplished without difficulty. The patient tolerated                         the procedure well. Findings:      The duodenal bulb, first portion of the duodenum and second portion of       the duodenum were normal. Biopsies for histology were taken with a cold       forceps for evaluation of celiac disease. Estimated blood loss was       minimal.      Localized mild inflammation characterized by erythema was found in the       gastric antrum. Biopsies were taken with a cold forceps for Helicobacter       pylori testing. Estimated  blood loss was minimal.      The exam of the stomach was otherwise normal.      No signs of GAVE, Gastric varices or portal hypertensive gastropathy,       Estimated blood loss: none.      The Z-line was irregular. Two tongues of salmon colored mucosa extending       ~1cm above the z-line. Biopsies were taken with a cold forceps for       histology. Estimated blood loss was minimal.      Esophagogastric landmarks were identified: the gastroesophageal junction       was found at 40 cm from the incisors.      The exam of the esophagus was otherwise normal.      No signs of esophageal varices Impression:            - Normal duodenal bulb, first portion of the duodenum                         and second portion of the duodenum. Biopsied.                        - Gastritis. Biopsied.                        - Z-line irregular. Biopsied.                        - Esophagogastric landmarks identified. Recommendation:        - Patient has a contact number available for                         emergencies. The signs and symptoms of potential                         delayed complications were discussed with the patient.                         Return to normal activities tomorrow. Written                         discharge instructions were provided to the patient.                        - Discharge  patient to home.                        - Resume previous diet.                        - Continue present medications.                        - Await pathology results.                        - Repeat upper endoscopy for surveillance based on                         pathology results.                        - Return to GI office as previously scheduled.                        - The findings and recommendations were discussed with                         the patient. Procedure Code(s):     --- Professional ---                        [phone], Esophagogastroduodenoscopy, flexible,                          transoral; with biopsy, single or multiple Diagnosis Code(s):     --- Professional ---                        K29.70, Gastritis, unspecified, without bleeding                        K22.89, Other specified disease of esophagus CPT copyright 2022 American Medical Association. All rights reserved. The codes documented in this report are preliminary and upon coder review may  be revised to meet current compliance requirements. Attending Participation:      I personally performed the entire procedure. Volney American, DO [name] DO, DO 05/21/2022 11:44:51 AM This report has been signed electronically. Number of Addenda: 0 Note Initiated On: 05/21/2022 11:06 AM Estimated Blood Loss:  Estimated blood loss: none.      [facility]

## 2022-05-24 ENCOUNTER — Encounter: Payer: Self-pay | Admitting: Gastroenterology

## 2022-05-24 LAB — SURGICAL PATHOLOGY

## 2022-05-26 ENCOUNTER — Telehealth (HOSPITAL_COMMUNITY): Payer: Self-pay | Admitting: Emergency Medicine

## 2022-05-26 NOTE — Telephone Encounter (Signed)
Reaching out to patient to offer assistance regarding upcoming cardiac imaging study; pt verbalizes understanding of appt date/time, parking situation and where to check in, pre-test NPO status and medications ordered, and verified current allergies; name and call back number provided for further questions should they arise Marchia Bond RN Navigator Cardiac Imaging [facility] [phone] office [phone] cell  Arrival [address] [facility]  197m metoprolol + 15mivabradine  Denies iv issues Aware contrast/nitro

## 2022-05-27 ENCOUNTER — Ambulatory Visit
Admission: RE | Admit: 2022-05-27 | Discharge: 2022-05-27 | Disposition: A | Discharge status: Home / Self Care | Payer: Medicare Other | Source: Ambulatory Visit | Attending: Cardiology | Admitting: Cardiology

## 2022-05-27 DIAGNOSIS — R911 Solitary pulmonary nodule: Secondary | ICD-10-CM | POA: Diagnosis present

## 2022-05-27 DIAGNOSIS — I2089 Other forms of angina pectoris: Secondary | ICD-10-CM | POA: Insufficient documentation

## 2022-05-27 DIAGNOSIS — R0609 Other forms of dyspnea: Secondary | ICD-10-CM | POA: Diagnosis present

## 2022-05-27 MED ORDER — NITROGLYCERIN 0.4 MG SL SUBL
0.8000 mg | SUBLINGUAL_TABLET | Freq: Once | SUBLINGUAL | Status: AC
  Administered 2022-05-27: 0.8 mg via SUBLINGUAL

## 2022-05-27 MED ORDER — METOPROLOL TARTRATE 5 MG/5ML IV SOLN
10.0000 mg | Freq: Once | INTRAVENOUS | Status: AC
  Administered 2022-05-27: 10 mg via INTRAVENOUS

## 2022-05-27 MED ORDER — IOHEXOL 350 MG/ML SOLN
75.0000 mL | Freq: Once | INTRAVENOUS | Status: AC | PRN
  Administered 2022-05-27: 75 mL via INTRAVENOUS

## 2022-05-27 NOTE — Progress Notes (Signed)
Patient tolerated CT well. Drank water after. Vital signs stable encourage to drink water throughout day.Reasons explained and verbalized understanding. Ambulated steady gait.  

## 2022-05-28 ENCOUNTER — Encounter: Payer: Self-pay | Admitting: Internal Medicine

## 2022-05-28 DIAGNOSIS — R911 Solitary pulmonary nodule: Secondary | ICD-10-CM

## 2022-05-31 ENCOUNTER — Telehealth: Payer: Self-pay | Admitting: Internal Medicine

## 2022-05-31 MED ORDER — DOXYCYCLINE HYCLATE 100 MG PO TABS: 100.0000 mg | ORAL_TABLET | Freq: Two times a day (BID) | ORAL | 0 refills | Status: DC

## 2022-05-31 NOTE — Telephone Encounter (Signed)
Per Dr. Mortimer Fries, lets do doxycycline 100 mg BID for 7 days, thank you.

## 2022-05-31 NOTE — Telephone Encounter (Signed)
Hey Dr. Mortimer Fries, when I go to send in the Augmentin he flags that he has a Penicillin allergy. It does not tell me what the reaction is. Is it ok to still send in the Augmentin?

## 2022-05-31 NOTE — Telephone Encounter (Signed)
Patient would like Augmentin to ge called into pharmacy. Pharmacy is [facility] [place].Patient phone number is [phone].

## 2022-06-01 NOTE — Telephone Encounter (Signed)
The patient has already received the Doxycyline.  See MyChart Message from 05/31/2022.  Nothing further needed.

## 2022-06-09 ENCOUNTER — Other Ambulatory Visit (HOSPITAL_COMMUNITY): Payer: Self-pay

## 2022-06-09 NOTE — Telephone Encounter (Signed)
Per test claim PA is APPROVED. Last fill on 05/18/2022 for 90 day supply.

## 2022-06-18 ENCOUNTER — Telehealth: Payer: Self-pay

## 2022-06-18 NOTE — Telephone Encounter (Signed)
Spoke to patient and requested that he bring SD card to 06/21/2022 appt.

## 2022-06-21 ENCOUNTER — Encounter: Payer: Self-pay | Admitting: Internal Medicine

## 2022-06-21 ENCOUNTER — Ambulatory Visit: Payer: Medicare Other | Attending: Cardiology

## 2022-06-21 ENCOUNTER — Ambulatory Visit (INDEPENDENT_AMBULATORY_CARE_PROVIDER_SITE_OTHER): Payer: Medicare Other | Admitting: Internal Medicine

## 2022-06-21 VITALS — BP 120/80 | HR 84 | Temp 98.3°F | Ht 69.0 in | Wt 185.8 lb

## 2022-06-21 DIAGNOSIS — R0609 Other forms of dyspnea: Secondary | ICD-10-CM | POA: Insufficient documentation

## 2022-06-21 DIAGNOSIS — J449 Chronic obstructive pulmonary disease, unspecified: Secondary | ICD-10-CM

## 2022-06-21 DIAGNOSIS — R9389 Abnormal findings on diagnostic imaging of other specified body structures: Secondary | ICD-10-CM

## 2022-06-21 DIAGNOSIS — G4733 Obstructive sleep apnea (adult) (pediatric): Secondary | ICD-10-CM | POA: Diagnosis not present

## 2022-06-21 LAB — ECHOCARDIOGRAM COMPLETE
AR max vel: 2.77 cm2
AV Area VTI: 2.45 cm2
AV Area mean vel: 2.46 cm2
AV Mean grad: 3 mmHg
AV Peak grad: 4.5 mmHg
Ao pk vel: 1.06 m/s
Area-P 1/2: 5.88 cm2
Calc EF: 49.2 %
Height: 69 in
S' Lateral: 3.5 cm
Single Plane A2C EF: 48.1 %
Single Plane A4C EF: 50.5 %
Weight: 2972.8 oz

## 2022-06-21 MED ORDER — TRELEGY ELLIPTA 200-62.5-25 MCG/ACT IN AEPB: 2.0000 | INHALATION_SPRAY | Freq: Every day | RESPIRATORY_TRACT | 5 refills | Status: DC

## 2022-06-21 MED ORDER — TRELEGY ELLIPTA 200-62.5-25 MCG/ACT IN AEPB: 1.0000 | INHALATION_SPRAY | Freq: Every day | RESPIRATORY_TRACT | 0 refills | Status: DC

## 2022-06-21 NOTE — Progress Notes (Signed)
[facility] Consultation      Date: 06/21/2022,   MRN# [id number] David [name] 1955/06/03  David [name] is a 67 y.o. old male seen in consultation for COPD   Synopsis estabished care in 2017 for Severe COPD PFT's 10/2015 Fev1 28% 1.03L  6MWT WNL 95% o2 sat at 1083 feet ONO +for hypoxia on oxygen  CT chest 3.15.19 Reviewed with patient +emphysema No suspicious nodules, massess No effusions No pneumonia  CT chest 05/2022 Right upper lobe nodular densities Patient given antibiotics  Previous compliance report  compliance report shared with patient AHI 0.1 100% compliance AUTOCPAP 7-18c hes  **12/2019 compliance report Excellent compliance report AHI down to 0.1  **07/09/2020 Excellent compliance report 100% for days Greater than 97% percent for over 4 hours Auto CPAP 7 to 18 cm water pressure AHI 0.1  **12/2020 Excellent compliance report 100% for days 97% greater than 4 hours AHI reduced to 0.2  06/2022 DL   CHIEF COMPLAINT:   Follow-up OSA Follow-up COPD Follow-up abnormal CT chest   HISTORY OF PRESENT ILLNESS   Re: COPD  no exacerbation at this time No evidence of heart failure at this time No evidence or signs of infection at this time No respiratory distress No fevers, chills, nausea, vomiting, diarrhea No evidence of lower extremity edema No evidence hemoptysis COPD seems to be stable at this time  His shortness of breath and dyspnea exertion has progressively worsened since July 196? when he was diagnosed with COVID-19 infection Exercise tolerance and capacity has diminished over time  Pulmonary function test reveals FEV1 24% predicted Very severe obstructive airways disease Stays active  Plays golf diagnose COPD 1998  Inhalers include Symbicort and Spiriva  Patient has tried Trelegy and Breztri inhalers in the past   Regarding OSA Compliance report reviewed in detail   Regarding weight Last office visit  weight 195 pounds Current weight He has gained weight over the last couple years       Current Medication:  Current Outpatient Medications:    albuterol (VENTOLIN HFA) 108 (90 Base) MCG/ACT inhaler, USE 2 INHALATIONS BY MOUTH  EVERY 6 HOURS AS NEEDED FOR WHEEZING, Disp: 34 g, Rfl: 3   azelastine (ASTELIN) 0.1 % nasal spray, Place into both nostrils 2 (two) times daily. Use in each nostril as directed, Disp: , Rfl:    doxycycline (VIBRA-TABS) 100 MG tablet, Take 1 tablet (100 mg total) by mouth 2 (two) times daily., Disp: 14 tablet, Rfl: 0   ipratropium-albuterol (DUONEB) 0.5-2.5 (3) MG/3ML SOLN, Take 3 mLs by nebulization every 4 (four) hours as needed., Disp: 360 mL, Rfl: 3   levothyroxine (SYNTHROID, LEVOTHROID) 112 MCG tablet, Take 112 mcg by mouth daily before breakfast., Disp: , Rfl:    methylPREDNISolone (MEDROL DOSEPAK) 4 MG TBPK tablet, Take as directed on package (Patient not taking: Reported on 05/10/2022), Disp: 1 each, Rfl: 0   metoprolol tartrate (LOPRESSOR) 100 MG tablet, Take 1 tablet (100 mg total) by mouth once for 1 dose. TWO HOURS PRIOR TO CARDIAC CTA, Disp: 1 tablet, Rfl: 0   montelukast (SINGULAIR) 10 MG tablet, TAKE 1 TABLET BY MOUTH  DAILY, Disp: 90 tablet, Rfl: 3   oxybutynin (DITROPAN-XL) 5 MG 24 hr tablet, Take 5 mg by mouth daily., Disp: , Rfl:    rosuvastatin (CRESTOR) 10 MG tablet, Take 10 mg by mouth daily., Disp: , Rfl:    SPIRIVA RESPIMAT 2.5 MCG/ACT AERS, USE 2 INHALATIONS BY MOUTH  DAILY, Disp:  12 g, Rfl: 3   SYMBICORT 160-4.5 MCG/ACT inhaler, USE 2 INHALATIONS BY MOUTH  TWICE DAILY, Disp: 30.6 g, Rfl: 3   vitamin B-12 (CYANOCOBALAMIN) 1000 MCG tablet, Take 1,000 mcg by mouth daily., Disp: , Rfl:    Vitamin D, Cholecalciferol, 10 MCG (400 UNIT) TABS, Take by mouth., Disp: , Rfl:     ALLERGIES   Penicillins      Review of Systems: Gen:  Denies  fever, sweats, chills weight loss  HEENT: Denies blurred vision, double vision, ear pain, eye pain,  hearing loss, nose bleeds, sore throat Cardiac:  No dizziness, chest pain or heaviness, chest tightness,edema, No JVD Resp:   No cough, -sputum production, -shortness of breath,-wheezing, -hemoptysis,  Other:  All other systems negative  There were no vitals taken for this visit.   Physical Examination:   General Appearance: No distress  EYES PERRLA, EOM intact.   NECK Supple, No JVD Pulmonary: normal breath sounds, No wheezing.  CardiovascularNormal S1,S2.  No m/r/g.   Abdomen: Benign, Soft, non-tender. Neurology UE/LE 5/5 strength, no focal deficits Ext pulses intact, cap refill intact ALL OTHER ROS ARE NEGATIVE     CT chest 3.15.19 Emphysematous changes, pulmonary scarring and patchy areas of peribronchial thickening and minimal bronchiectasis. No suspicious nodules, masses  CT chest 05/2022 CT chest 05/2022  ASSESSMENT/PLAN   67 year old pleasant white male seen today for follow-up severe end-stage COPD FEV1 24% predicted Gold stage D associated with chronic respiratory insufficiency with underlying sleep apnea in the setting of abnormal CT chest recently found on February 2024   Severe COPD  Based on CT scan findings doxycycline was given for 7 days  Continue inhalers as prescribed   OSA Excellent compliance report 100% days and greater than 4 hours usage Well-controlled with CPAP therapy  Respiratory insufficiency with shortness of breath and dyspnea on exertion Definitely related to COPD Previous 6-minute walk test May 2022 did not show exertional hypoxia  Obesity -recommend significant weight loss -recommend changing diet  Deconditioned state -Recommend increased daily activity and exercise  Abnormal CT chest  New cluster of ill-defined nodules in the medial right lower lobe with dominant nodule measuring 9 mm diameter. Patient was given doxycycline for atypical pneumonia Repeat CT chest needed for follow-up assessment  MEDICATION ADJUSTMENTS/LABS  AND TESTS ORDERED: Continue CPAP as prescribed  REPEAT CT CHEST PENDING APRIL 2024  STOP SYMBICORT AND SPIRIVA  START [name] WITH 6 MINUTE WALK TEST   Patient satisfied with Plan of action and management. All questions answered  Follow up in 6 weeks   Total time spent 32 mins  Maretta Bees Patricia Pesa, M.D.  [facility]  Medical Director [place] Director [facility] Cardio-Pulmonary Department

## 2022-06-21 NOTE — Telephone Encounter (Signed)
PA team, please advise. Trelegy requires PA

## 2022-06-21 NOTE — Addendum Note (Signed)
Addended by: Claudette Head A on: 06/21/2022 10:30 AM   Modules accepted: Orders

## 2022-06-21 NOTE — Patient Instructions (Addendum)
Continue CPAP as prescribed  REPEAT CT CHEST PENDING APRIL 2024  STOP SYMBICORT AND SPIRIVA  START TRELEGY INHALER  ASSESS WITH 6 MINUTE WALK TEST

## 2022-06-24 ENCOUNTER — Other Ambulatory Visit: Payer: Self-pay | Admitting: Primary Care

## 2022-06-24 DIAGNOSIS — J309 Allergic rhinitis, unspecified: Secondary | ICD-10-CM

## 2022-06-25 ENCOUNTER — Other Ambulatory Visit: Payer: Self-pay | Admitting: Internal Medicine

## 2022-06-25 ENCOUNTER — Other Ambulatory Visit (HOSPITAL_COMMUNITY): Payer: Self-pay

## 2022-06-25 ENCOUNTER — Telehealth: Payer: Self-pay | Admitting: Internal Medicine

## 2022-06-25 DIAGNOSIS — J449 Chronic obstructive pulmonary disease, unspecified: Secondary | ICD-10-CM

## 2022-06-25 MED ORDER — TRELEGY ELLIPTA 200-62.5-25 MCG/ACT IN AEPB: 1.0000 | INHALATION_SPRAY | Freq: Every day | RESPIRATORY_TRACT | 1 refills | Status: DC

## 2022-06-25 NOTE — Telephone Encounter (Signed)
Trelegy is one puff once daily. Rx was written incorrectly.  Patient is aware of of this and voiced his understanding.  Rx corrected and sent to pharmacy. Nothing further needed/

## 2022-06-25 NOTE — Telephone Encounter (Signed)
Please see 06/25/2022 phone note.

## 2022-06-25 NOTE — Telephone Encounter (Signed)
Patient needs RX for Trelegy 2 puffs daily. Pharmacy [facility] [place]. Needs 3 month supply. Patient phone number is [phone].

## 2022-06-25 NOTE — Telephone Encounter (Signed)
Per test claim Trelegy is showing $20.00 for 1 month and $60.00 for 3 months, not sure if patient is talking about a different medication?

## 2022-06-29 ENCOUNTER — Ambulatory Visit: Payer: Medicare Other | Admitting: Cardiology

## 2022-06-29 ENCOUNTER — Encounter: Payer: Self-pay | Admitting: Cardiology

## 2022-07-19 ENCOUNTER — Encounter: Payer: Self-pay | Admitting: Gastroenterology

## 2022-07-23 ENCOUNTER — Encounter: Payer: Self-pay | Admitting: Gastroenterology

## 2022-07-25 NOTE — H&P (Signed)
Pre-Procedure H&P   Patient ID: David [name] is a 67 y.o. male.  Gastroenterology Provider: Jaynie Collins, DO  PCP: Lynnea Ferrier, MD  Date: 07/26/2022  HPI Mr. David [name] is a 67 y.o. male who presents today for Colonoscopy for Surveillance-personal history of colon polyps .  Patient with cirrhosis.  Last underwent colonoscopy in December 2018 with 1 adenomatous polyp and internal hemorrhoids noted.  He has noted incomplete evacuation with his bowel movements at times. This has recently improved.  He does note poor water hydration.  He denies any melena or hematochezia.  No other constipation or diarrhea.  Appetite and weight have been maintained.  No family history of colon cancer or colon polyps. Hemoglobin 14.2 MCV 103 platelets 224,000 History of C-spine fusion  CT on January 31 of this year demonstrated persistent luminal narrowing at the hepatic flexure  Past Medical History:  Diagnosis Date   B12 deficiency    Complete tear of right rotator cuff    Compression fracture of spine    COPD (chronic obstructive pulmonary disease)    DDD (degenerative disc disease), lumbar    H/O emphysema    Hyperlipidemia    Hypothyroidism    Lung nodule, multiple    . AND 8 MM   Nocturnal hypoxia    Pneumonia    Shortness of breath on exertion    Sleep apnea    Thrombocytopenia    Thyroid disease     Past Surgical History:  Procedure Laterality Date   ANTERIOR CERVICAL DECOMP/DISCECTOMY FUSION N/A 08/20/2019   Procedure: ANTERIOR CERVICAL DECOMPRESSION/DISCECTOMY FUSION 2 LEVELS C4/5, C5/6;  Surgeon: Lucy Chris, MD;  Location: [facility] ORS;  Service: Neurosurgery;  Laterality: N/A;   BACK SURGERY  1991   L5-S1   COLONOSCOPY     ELBOW ARTHROSCOPY Right    ESOPHAGOGASTRODUODENOSCOPY (EGD) WITH PROPOFOL N/A 09/26/2017   Procedure: ESOPHAGOGASTRODUODENOSCOPY (EGD) WITH PROPOFOL;  Surgeon: Toney Reil, MD;  Location[facility] ENDOSCOPY;  Service:  Gastroenterology;  Laterality: N/A;   ESOPHAGOGASTRODUODENOSCOPY (EGD) WITH PROPOFOL N/A 05/21/2022   Procedure: ESOPHAGOGASTRODUODENOSCOPY (EGD) WITH PROPOFOL;  Surgeon: Jaynie Collins, DO;  Location: [facility] ENDOSCOPY;  Service: Gastroenterology;  Laterality: N/A;   HEMORRHOID SURGERY  1982   KYPHOPLASTY N/A 06/10/2016   Procedure: KYPHOPLASTY T12;  Surgeon: Kennedy Bucker, MD;  Location: [facility] ORS;  Service: Orthopedics;  Laterality: N/A;   KYPHOPLASTY     T12   SHOULDER ARTHROSCOPY     TENODESIS LONG TENDON BICEPS Right    TONSILLECTOMY     WISDOM TOOTH EXTRACTION  1982    Family History No h/o GI disease or malignancy  Review of Systems  Constitutional:  Negative for activity change, appetite change, chills, diaphoresis, fatigue, fever and unexpected weight change.  HENT:  Negative for trouble swallowing and voice change.   Respiratory:  Negative for shortness of breath and wheezing.   Cardiovascular:  Negative for chest pain, palpitations and leg swelling.  Gastrointestinal:  Negative for abdominal distention, abdominal pain, anal bleeding, blood in stool, constipation, diarrhea, nausea and vomiting.       + incomplete evacuation  Musculoskeletal:  Negative for arthralgias and myalgias.  Skin:  Negative for color change and pallor.  Neurological:  Negative for dizziness, syncope and weakness.  Psychiatric/Behavioral:  Negative for confusion. The patient is not nervous/anxious.   All other systems reviewed and are negative.    Medications No current facility-administered medications on file prior to encounter.  Current Outpatient Medications on File Prior to Encounter  Medication Sig Dispense Refill   azelastine (ASTELIN) 0.1 % nasal spray Place into both nostrils 2 (two) times daily. Use in each nostril as directed     ipratropium-albuterol (DUONEB) 0.5-2.5 (3) MG/3ML SOLN Take 3 mLs by nebulization every 4 (four) hours as needed. 360 mL 3   levothyroxine (SYNTHROID,  LEVOTHROID) 112 MCG tablet Take 112 mcg by mouth daily before breakfast.     oxybutynin (DITROPAN-XL) 5 MG 24 hr tablet Take 5 mg by mouth daily.     rosuvastatin (CRESTOR) 10 MG tablet Take 10 mg by mouth daily.     vitamin B-12 (CYANOCOBALAMIN) 1000 MCG tablet Take 1,000 mcg by mouth daily.     Vitamin D, Cholecalciferol, 10 MCG (400 UNIT) TABS Take 1 tablet by mouth daily.      Pertinent medications related to GI and procedure were reviewed by me with the patient prior to the procedure   Current Facility-Administered Medications:    0.9 %  sodium chloride infusion, , Intravenous, Continuous, Jaynie Collins, DO      Allergies  Allergen Reactions   Penicillins Other (See Comments)    Unknown/ Childhood   Allergies were reviewed by me prior to the procedure  Objective   Body mass index is 27.02 kg/m. Vitals:   07/26/22 1150  BP: 124/88  Pulse: 66  Resp: 18  Temp: (!) 97 F (36.1 C)  TempSrc: Temporal  SpO2: 99%  Weight: 83 kg  Height: 5\' 9"  (1.753 m)     Physical Exam Vitals and nursing note reviewed.  Constitutional:      General: He is not in acute distress.    Appearance: Normal appearance. He is not ill-appearing, toxic-appearing or diaphoretic.  HENT:     Head: Normocephalic and atraumatic.     Nose: Nose normal.     Mouth/Throat:     Mouth: Mucous membranes are moist.     Pharynx: Oropharynx is clear.  Eyes:     General: No scleral icterus.    Extraocular Movements: Extraocular movements intact.  Cardiovascular:     Rate and Rhythm: Normal rate and regular rhythm.     Heart sounds: Normal heart sounds. No murmur heard.    No friction rub. No gallop.  Pulmonary:     Effort: Pulmonary effort is normal. No respiratory distress.     Breath sounds: No wheezing, rhonchi or rales.     Comments: Diminished bilaterally Abdominal:     General: Bowel sounds are normal. There is no distension.     Palpations: Abdomen is soft.     Tenderness: There is  no abdominal tenderness. There is no guarding or rebound.  Musculoskeletal:     Cervical back: Neck supple.     Right lower leg: No edema.     Left lower leg: No edema.  Skin:    General: Skin is warm and dry.     Coloration: Skin is not jaundiced or pale.  Neurological:     General: No focal deficit present.     Mental Status: He is alert and oriented to person, place, and time. Mental status is at baseline.  Psychiatric:        Mood and Affect: Mood normal.        Behavior: Behavior normal.        Thought Content: Thought content normal.        Judgment: Judgment normal.      Assessment:  Mr.  David [name] is a 67 y.o. male  who presents today for Colonoscopy for Surveillance-personal history of colon polyps .  Plan:  Colonoscopy with possible intervention today  Colonoscopy with possible biopsy, control of bleeding, polypectomy, and interventions as necessary has been discussed with the patient/patient representative. Informed consent was obtained from the patient/patient representative after explaining the indication, nature, and risks of the procedure including but not limited to death, bleeding, perforation, missed neoplasm/lesions, cardiorespiratory compromise, and reaction to medications. Opportunity for questions was given and appropriate answers were provided. Patient/patient representative has verbalized understanding is amenable to undergoing the procedure.   Jaynie Collins, DO  [facility] Gastroenterology  Portions of the record may have been created with voice recognition software. Occasional wrong-word or 'sound-a-like' substitutions may have occurred due to the inherent limitations of voice recognition software.  Read the chart carefully and recognize, using context, where substitutions may have occurred.

## 2022-07-26 ENCOUNTER — Ambulatory Visit
Admission: RE | Admit: 2022-07-26 | Discharge: 2022-07-26 | Disposition: A | Discharge status: Home / Self Care | Payer: Medicare Other | Attending: Gastroenterology | Admitting: Gastroenterology

## 2022-07-26 ENCOUNTER — Ambulatory Visit: Payer: Medicare Other | Admitting: General Practice

## 2022-07-26 ENCOUNTER — Encounter
Admission: RE | Disposition: A | Discharge status: Home / Self Care | Payer: Self-pay | Source: Home / Self Care | Attending: Gastroenterology

## 2022-07-26 ENCOUNTER — Encounter: Payer: Self-pay | Admitting: Gastroenterology

## 2022-07-26 ENCOUNTER — Other Ambulatory Visit: Payer: Self-pay

## 2022-07-26 DIAGNOSIS — E538 Deficiency of other specified B group vitamins: Secondary | ICD-10-CM | POA: Diagnosis not present

## 2022-07-26 DIAGNOSIS — K640 First degree hemorrhoids: Secondary | ICD-10-CM | POA: Diagnosis not present

## 2022-07-26 DIAGNOSIS — D122 Benign neoplasm of ascending colon: Secondary | ICD-10-CM | POA: Diagnosis not present

## 2022-07-26 DIAGNOSIS — E785 Hyperlipidemia, unspecified: Secondary | ICD-10-CM | POA: Diagnosis not present

## 2022-07-26 DIAGNOSIS — G4730 Sleep apnea, unspecified: Secondary | ICD-10-CM | POA: Insufficient documentation

## 2022-07-26 DIAGNOSIS — Z1211 Encounter for screening for malignant neoplasm of colon: Secondary | ICD-10-CM | POA: Insufficient documentation

## 2022-07-26 DIAGNOSIS — D123 Benign neoplasm of transverse colon: Secondary | ICD-10-CM | POA: Insufficient documentation

## 2022-07-26 DIAGNOSIS — K7460 Unspecified cirrhosis of liver: Secondary | ICD-10-CM | POA: Insufficient documentation

## 2022-07-26 DIAGNOSIS — J439 Emphysema, unspecified: Secondary | ICD-10-CM | POA: Insufficient documentation

## 2022-07-26 DIAGNOSIS — Z87891 Personal history of nicotine dependence: Secondary | ICD-10-CM | POA: Diagnosis not present

## 2022-07-26 DIAGNOSIS — E039 Hypothyroidism, unspecified: Secondary | ICD-10-CM | POA: Diagnosis not present

## 2022-07-26 HISTORY — PX: COLONOSCOPY: SHX5424

## 2022-07-26 SURGERY — COLONOSCOPY
Anesthesia: General

## 2022-07-26 MED ORDER — PROPOFOL 10 MG/ML IV BOLUS
INTRAVENOUS | Status: DC | PRN
  Administered 2022-07-26: 80 mg via INTRAVENOUS
  Administered 2022-07-26: 20 mg via INTRAVENOUS

## 2022-07-26 MED ORDER — PROPOFOL 500 MG/50ML IV EMUL
INTRAVENOUS | Status: DC | PRN
  Administered 2022-07-26: 75 ug/kg/min via INTRAVENOUS

## 2022-07-26 MED ORDER — SODIUM CHLORIDE 0.9 % IV SOLN: INTRAVENOUS | Status: DC

## 2022-07-26 MED ORDER — PROPOFOL 10 MG/ML IV BOLUS
INTRAVENOUS | Status: AC
  Filled 2022-07-26: qty 20

## 2022-07-26 MED ORDER — LIDOCAINE HCL (CARDIAC) PF 100 MG/5ML IV SOSY
PREFILLED_SYRINGE | INTRAVENOUS | Status: DC | PRN
  Administered 2022-07-26: 50 mg via INTRAVENOUS

## 2022-07-26 NOTE — Anesthesia Preprocedure Evaluation (Signed)
Anesthesia Evaluation  Patient identified by MRN, date of birth, ID band Patient awake    Reviewed: Allergy & Precautions, NPO status , Patient's Chart, lab work & pertinent test results  Airway Mallampati: III  TM Distance: <3 FB Neck ROM: Full    Dental  (+) Teeth Intact, Dental Advidsory Given   Pulmonary neg pulmonary ROS, sleep apnea and Continuous Positive Airway Pressure Ventilation , COPD, former smoker   Pulmonary exam normal  + decreased breath sounds      Cardiovascular Exercise Tolerance: Good (-) angina (-) Past MI negative cardio ROS Normal cardiovascular exam Rhythm:Regular Rate:Normal     Neuro/Psych negative neurological ROS  negative psych ROS   GI/Hepatic negative GI ROS, Neg liver ROS,,,  Endo/Other  negative endocrine ROSHypothyroidism    Renal/GU negative Renal ROS  negative genitourinary   Musculoskeletal   Abdominal  (+) + obese  Peds negative pediatric ROS (+)  Hematology negative hematology ROS (+)   Anesthesia Other Findings Past Medical History: No date: B12 deficiency No date: Complete tear of right rotator cuff No date: Compression fracture of spine (HCC) No date: COPD (chronic obstructive pulmonary disease) (HCC) No date: DDD (degenerative disc disease), lumbar No date: H/O emphysema (HCC) No date: Hyperlipidemia No date: Hypothyroidism No date: Lung nodule, multiple     Comment:  . AND 8 MM No date: Nocturnal hypoxia No date: Pneumonia No date: Shortness of breath on exertion No date: Sleep apnea No date: Thrombocytopenia (HCC) No date: Thyroid disease  Past Surgical History: 08/20/2019: ANTERIOR CERVICAL DECOMP/DISCECTOMY FUSION; N/A     Comment:  Procedure: ANTERIOR CERVICAL DECOMPRESSION/DISCECTOMY               FUSION 2 LEVELS C4/5, C5/6;  Surgeon: Lucy Chris, MD;                Location: [facility] ORS;  Service: Neurosurgery;  Laterality:               N/A; 1991:  BACK SURGERY     Comment:  L5-S1 No date: COLONOSCOPY No date: ELBOW ARTHROSCOPY; Right 09/26/2017: ESOPHAGOGASTRODUODENOSCOPY (EGD) WITH PROPOFOL; N/A     Comment:  Procedure: ESOPHAGOGASTRODUODENOSCOPY (EGD) WITH               PROPOFOL;  Surgeon: Toney Reil, MD;  Location:               [facility] ENDOSCOPY;  Service: Gastroenterology;  Laterality:               N/A; 1982: HEMORRHOID SURGERY 06/10/2016: KYPHOPLASTY; N/A     Comment:  Procedure: KYPHOPLASTY T12;  Surgeon: Kennedy Bucker, MD;               Location: [facility] ORS;  Service: Orthopedics;  Laterality:               N/A; No date: KYPHOPLASTY     Comment:  T12 No date: SHOULDER ARTHROSCOPY No date: TENODESIS LONG TENDON BICEPS; Right No date: TONSILLECTOMY 1982: WISDOM TOOTH EXTRACTION     Reproductive/Obstetrics negative OB ROS                             Anesthesia Physical Anesthesia Plan  ASA: 3  Anesthesia Plan: General   Post-op Pain Management: Minimal or no pain anticipated   Induction: Intravenous  PONV Risk Score and Plan: 3 and Propofol infusion and TIVA  Airway Management Planned: Natural Airway  Additional  Equipment: None  Intra-op Plan:   Post-operative Plan:   Informed Consent: I have reviewed the patients History and Physical, chart, labs and discussed the procedure including the risks, benefits and alternatives for the proposed anesthesia with the patient or authorized representative who has indicated his/her understanding and acceptance.     Dental Advisory Given  Plan Discussed with: CRNA and Surgeon  Anesthesia Plan Comments: (Discussed risks of anesthesia with patient, including possibility of difficulty with spontaneous ventilation under anesthesia necessitating airway intervention, PONV, and rare risks such as cardiac or respiratory or neurological events, and allergic reactions. Discussed the role of CRNA in patient's perioperative care. Patient understands.)        Anesthesia Quick Evaluation

## 2022-07-26 NOTE — Interval H&P Note (Signed)
History and Physical Interval Note: Preprocedure H&P from 07/26/22  was reviewed and there was no interval change after seeing and examining the patient.  Written consent was obtained from the patient after discussion of risks, benefits, and alternatives. Patient has consented to proceed with Colonoscopy with possible intervention   07/26/2022 11:59 AM  David [name]  has presented today for surgery, with the diagnosis of Cirrhosis of liver without ascites, unspecified hepatic cirrhosis type (CMS-HCC) (K74.60) Personal history of colonic polyps (Z86.010).  The various methods of treatment have been discussed with the patient and family. After consideration of risks, benefits and other options for treatment, the patient has consented to  Procedure(s): COLONOSCOPY (N/A) as a surgical intervention.  The patient's history has been reviewed, patient examined, no change in status, stable for surgery.  I have reviewed the patient's chart and labs.  Questions were answered to the patient's satisfaction.     Jaynie Collins

## 2022-07-26 NOTE — Anesthesia Postprocedure Evaluation (Signed)
Anesthesia Post Note  Patient: David [name]  Procedure(s) Performed: COLONOSCOPY  Patient location during evaluation: PACU Anesthesia Type: General Level of consciousness: awake and alert, oriented and patient cooperative Pain management: pain level controlled Vital Signs Assessment: post-procedure vital signs reviewed and stable Respiratory status: spontaneous breathing, nonlabored ventilation and respiratory function stable Cardiovascular status: blood pressure returned to baseline and stable Postop Assessment: adequate PO intake Anesthetic complications: no   No notable events documented.   Last Vitals:  Vitals:   07/26/22 1255 07/26/22 1305  BP: 130/88 135/78  Pulse: 64 68  Resp: 19   Temp:    SpO2: 97% 98%    Last Pain:  Vitals:   07/26/22 1255  TempSrc:   PainSc: 0-No pain                 Reed Breech

## 2022-07-26 NOTE — Transfer of Care (Signed)
Immediate Anesthesia Transfer of Care Note  Patient: David [name]  Procedure(s) Performed: COLONOSCOPY  Patient Location: Endoscopy Unit  Anesthesia Type:General  Level of Consciousness: awake, alert , and oriented  Airway & Oxygen Therapy: Patient Spontanous Breathing  Post-op Assessment: Report given to RN and Post -op Vital signs reviewed and stable  Post vital signs: Reviewed and stable  Last Vitals:  Vitals Value Taken Time  BP 132/92 1246  Temp    Pulse 67   Resp 17   SpO2 100     Last Pain:  Vitals:   07/26/22 1150  TempSrc: Temporal  PainSc: 0-No pain         Complications: No notable events documented.

## 2022-07-26 NOTE — Op Note (Signed)
[facility]ology Patient Name: David [name] [facility] Procedure Date: 07/26/2022 12:10 PM MRN: [id number] Account #: [id number] Date of Birth: September 19, 1955 Admit Type: Outpatient Age: 67 Room: [facility] ENDO ROOM 2 Gender: Male Note Status: Finalized Instrument Name: Peds Colonoscope [id number] Procedure:             Colonoscopy Indications:           High risk colon cancer surveillance: Personal history                         of colonic polyps Providers:             Trenda Moots, DO Referring MD:          Daniel Nones, MD (Referring MD) Medicines:             Monitored Anesthesia Care Complications:         No immediate complications. Estimated blood loss:                         Minimal. Procedure:             Pre-Anesthesia Assessment:                        - Prior to the procedure, a History and Physical was                         performed, and patient medications and allergies were                         reviewed. The patient is competent. The risks and                         benefits of the procedure and the sedation options and                         risks were discussed with the patient. All questions                         were answered and informed consent was obtained.                         Patient identification and proposed procedure were                         verified by the physician, the nurse, the anesthetist                         and the technician in the endoscopy suite. Mental                         Status Examination: alert and oriented. Airway                         Examination: normal oropharyngeal airway and neck                         mobility. Respiratory Examination: clear to  auscultation and poor air movement. CV Examination:                         RRR, no murmurs, no S3 or S4. Prophylactic                         Antibiotics: The patient does not require prophylactic                          antibiotics. Prior Anticoagulants: The patient has                         taken no anticoagulant or antiplatelet agents. ASA                         Grade Assessment: III - A patient with severe systemic                         disease. After reviewing the risks and benefits, the                         patient was deemed in satisfactory condition to                         undergo the procedure. The anesthesia plan was to use                         monitored anesthesia care (MAC). Immediately prior to                         administration of medications, the patient was                         re-assessed for adequacy to receive sedatives. The                         heart rate, respiratory rate, oxygen saturations,                         blood pressure, adequacy of pulmonary ventilation, and                         response to care were monitored throughout the                         procedure. The physical status of the patient was                         re-assessed after the procedure.                        After obtaining informed consent, the colonoscope was                         passed under direct vision. Throughout the procedure,                         the patient's blood pressure, pulse, and oxygen  saturations were monitored continuously. The                         Colonoscope was introduced through the anus and                         advanced to the the terminal ileum, with                         identification of the appendiceal orifice and IC                         valve. The colonoscopy was performed without                         difficulty. The patient tolerated the procedure well.                         The quality of the bowel preparation was evaluated                         using the BBPS [facility] Bowel Preparation Scale) with                         scores of: Right Colon = 2 (minor amount of residual                          staining, small fragments of stool and/or opaque                         liquid, but mucosa seen well), Transverse Colon = 3                         (entire mucosa seen well with no residual staining,                         small fragments of stool or opaque liquid) and Left                         Colon = 2 (minor amount of residual staining, small                         fragments of stool and/or opaque liquid, but mucosa                         seen well). The total BBPS score equals 7. The quality                         of the bowel preparation was good. The terminal ileum,                         ileocecal valve, appendiceal orifice, and rectum were                         photographed. Findings:      The perianal and digital rectal examinations were normal. Pertinent       negatives include normal sphincter  tone.      The terminal ileum appeared normal. Estimated blood loss: none.      Retroflexion in the right colon was performed.      A 3 to 4 mm polyp was found in the ascending colon. The polyp was       sessile. The polyp was removed with a cold snare. Resection and       retrieval were complete. Estimated blood loss was minimal.      Two sessile polyps were found in the descending colon and transverse       colon. The polyps were 2 to 3 mm in size. These polyps were removed with       a jumbo cold forceps. Resection and retrieval were complete. Estimated       blood loss was minimal.      Non-bleeding internal hemorrhoids were found during retroflexion. The       hemorrhoids were Grade I (internal hemorrhoids that do not prolapse).       Estimated blood loss: none.      Luminal narrowing at hepatic flexure reported on 04/2022 CT was not       appreciated on endoscopic exam.      The exam was otherwise without abnormality on direct and retroflexion       views. Impression:            - The examined portion of the ileum was normal.                        - One 3 to 4 mm polyp  in the ascending colon, removed                         with a cold snare. Resected and retrieved.                        - Two 2 to 3 mm polyps in the descending colon and in                         the transverse colon, removed with a jumbo cold                         forceps. Resected and retrieved.                        - Non-bleeding internal hemorrhoids.                        - The examination was otherwise normal on direct and                         retroflexion views. Recommendation:        - Patient has a contact number available for                         emergencies. The signs and symptoms of potential                         delayed complications were discussed with the patient.                         Return  to normal activities tomorrow. Written                         discharge instructions were provided to the patient.                        - Discharge patient to home.                        - Resume previous diet.                        - Continue present medications.                        - No ibuprofen, naproxen, or other non-steroidal                         anti-inflammatory drugs for 5 days after polyp removal.                        - Await pathology results.                        - Repeat colonoscopy for surveillance based on                         pathology results.                        - Return to GI office tomorrow.                        - The findings and recommendations were discussed with                         the patient. Procedure Code(s):     --- Professional ---                        [phone], Colonoscopy, flexible; with removal of                         tumor(s), polyp(s), or other lesion(s) by snare                         technique                        45380, 59, Colonoscopy, flexible; with biopsy, single                         or multiple Diagnosis Code(s):     --- Professional ---                        Z86.010, Personal history of  colonic polyps                        K64.0, First degree hemorrhoids                        D12.2, Benign neoplasm of ascending colon  D12.4, Benign neoplasm of descending colon                        D12.3, Benign neoplasm of transverse colon (hepatic                         flexure or splenic flexure) CPT copyright 2022 American Medical Association. All rights reserved. The codes documented in this report are preliminary and upon coder review may  be revised to meet current compliance requirements. Attending Participation:      I personally performed the entire procedure. Elfredia Nevins, DO Jaynie Collins DO, DO 07/26/2022 12:47:59 PM This report has been signed electronically. Number of Addenda: 0 Note Initiated On: 07/26/2022 12:10 PM Scope Withdrawal Time: 0 hours 18 minutes 31 seconds  Total Procedure Duration: 0 hours 25 minutes 2 seconds  Estimated Blood Loss:  Estimated blood loss was minimal.      [facility]

## 2022-07-27 LAB — SURGICAL PATHOLOGY

## 2022-07-28 ENCOUNTER — Ambulatory Visit
Admission: RE | Admit: 2022-07-28 | Discharge: 2022-07-28 | Disposition: A | Discharge status: Home / Self Care | Payer: Medicare Other | Source: Ambulatory Visit | Attending: Internal Medicine | Admitting: Internal Medicine

## 2022-07-28 ENCOUNTER — Encounter: Payer: Self-pay | Admitting: Gastroenterology

## 2022-07-28 DIAGNOSIS — R911 Solitary pulmonary nodule: Secondary | ICD-10-CM | POA: Diagnosis present

## 2022-07-30 ENCOUNTER — Ambulatory Visit: Payer: Medicare Other

## 2022-08-02 ENCOUNTER — Ambulatory Visit (INDEPENDENT_AMBULATORY_CARE_PROVIDER_SITE_OTHER): Payer: Medicare Other

## 2022-08-02 ENCOUNTER — Other Ambulatory Visit: Payer: Self-pay | Admitting: Gastroenterology

## 2022-08-02 ENCOUNTER — Telehealth: Payer: Self-pay

## 2022-08-02 DIAGNOSIS — J449 Chronic obstructive pulmonary disease, unspecified: Secondary | ICD-10-CM

## 2022-08-02 DIAGNOSIS — K7460 Unspecified cirrhosis of liver: Secondary | ICD-10-CM

## 2022-08-02 NOTE — Telephone Encounter (Signed)
-----   Message from Erin Fulling, MD sent at 08/02/2022 10:46 AM EDT ----- CT chest looks better, can we set up another CT chest in 6 months to assess for pneumonia? ----- Message ----- From: Interface, Rad Results In Sent: 07/31/2022  10:06 PM EDT To: Erin Fulling, MD

## 2022-08-02 NOTE — Progress Notes (Signed)
Six Minute Walk - 08/02/22 1422       Six Minute Walk   Medications taken before test (dose and time) albuterol sulfate inhaler (2 puffs), vitamin D 10 mcg, vitamin B-12, Trelegy Ellipta 1 puff, Levothyroxine Sodium 112 Mcg, Singular 10 mg    Supplemental oxygen during test? No    Lap distance in meters  34 meters    Laps Completed 22.5    Partial lap (in meters) 3 meters    Baseline BP (sitting) 112/70    Baseline Heartrate 82    Baseline Dyspnea (Borg Scale) 6    Baseline Fatigue (Borg Scale) 1    Baseline SPO2 100 %      End of Test Values    BP (sitting) 130/64    Heartrate 78    Dyspnea (Borg Scale) 4    Fatigue (Borg Scale) 2    SPO2 98 %      2 Minutes Post Walk Values   BP (sitting) 130/80    Heartrate 82    SPO2 100 %    Stopped or paused before six minutes? No    Other Symptoms at end of exercise: --   states at the very end of the walk he felt lightheaded, but not dizzy.     Interpretation   Distance completed 768 meters    Tech Comments: Walked at a normal pace, no stops, no complaints during walk.

## 2022-08-02 NOTE — Telephone Encounter (Signed)
I have notified the patient of her CT results.  Dr. Belia Heman, he wants to know if you think he needs another round of antibiotics? His said last time he took them it helped a lot.

## 2022-08-03 NOTE — Telephone Encounter (Signed)
Per the patient request, I notified him through MyChart message.  Please see MyChart message from 08/03/2022.

## 2022-08-10 ENCOUNTER — Encounter: Payer: Self-pay | Admitting: Gastroenterology

## 2022-08-10 ENCOUNTER — Telehealth: Payer: Self-pay

## 2022-08-10 NOTE — Telephone Encounter (Signed)
Left detailed message for patient requesting that he bring SD card to 08/11/2022 appt.

## 2022-08-11 ENCOUNTER — Encounter: Payer: Self-pay | Admitting: Internal Medicine

## 2022-08-11 ENCOUNTER — Ambulatory Visit (INDEPENDENT_AMBULATORY_CARE_PROVIDER_SITE_OTHER): Payer: Medicare Other | Admitting: Internal Medicine

## 2022-08-11 VITALS — BP 126/74 | HR 73 | Temp 97.6°F | Ht 69.0 in | Wt 185.0 lb

## 2022-08-11 DIAGNOSIS — G4733 Obstructive sleep apnea (adult) (pediatric): Secondary | ICD-10-CM | POA: Diagnosis not present

## 2022-08-11 DIAGNOSIS — J449 Chronic obstructive pulmonary disease, unspecified: Secondary | ICD-10-CM

## 2022-08-11 DIAGNOSIS — R911 Solitary pulmonary nodule: Secondary | ICD-10-CM

## 2022-08-11 NOTE — Addendum Note (Signed)
Addended by: Lajoyce Lauber A on: 08/11/2022 09:34 AM   Modules accepted: Orders

## 2022-08-11 NOTE — Progress Notes (Signed)
[facility] Pulmonary Medicine Consultation      Date: 08/11/2022,   MRN# [id number] David [name] Nov 03, 1955  [place] [name] is a 67 y.o. old male seen in consultation for COPD   Synopsis estabished care in 2017 for Severe COPD PFT's 10/2015 Fev1 28% 1.03L  WNL 95% o2 sat at 1083 feet ONO +for hypoxia on oxygen  CT chest 3.15.19 Reviewed with patient +emphysema No suspicious nodules, massess No effusions No pneumonia  CT chest 05/2022 Right upper lobe nodular densities Patient given antibiotics  Previous compliance report  compliance report shared with patient AHI 0.1 100% compliance AUTOCPAP 7-18c hes  **12/2019 compliance report Excellent compliance report AHI down to 0.1  **07/09/2020 Excellent compliance report 100% for days Greater than 97% percent for over 4 hours Auto CPAP 7 to 18 cm water pressure AHI 0.1  **12/2020 Excellent compliance report 100% for days 97% greater than 4 hours AHI reduced to 0.2  06/2022 DL   CHIEF COMPLAINT:     Follow-up OSA  Follow-up COPD  Follow-up abnormal CT chest      HISTORY OF PRESENT ILLNESS   Re: COPD  No exacerbation at this time No evidence of heart failure at this time No evidence or signs of infection at this time No respiratory distress No fevers, chills, nausea, vomiting, diarrhea No evidence of lower extremity edema No evidence hemoptysis  COPD seems to be stable at this time  His shortness of breath and dyspnea exertion has progressively worsened since July 2022 when he was diagnosed with COVID-19 infection Exercise tolerance and capacity has diminished over time Plan for PULM REHAB REFERRAL  Pulmonary function test reveals FEV1 24% predicted Very severe obstructive airways disease Stays active pays golf   Plays golf diagnose COPD 1998  Inhalers include Symbicort and Spiriva  But currently on Trelegy 200 and seems to be helping as well Patient would like to continue Trelegy as  prescribed  Regarding OSA Excellent compliance report 100% for days and greater than 4 hours AHI reduced to 0.1 Fullface mask   Regarding weight Last office visit weight 195 pounds Current weight 183   Abnormal CT chest April 2024 compared to February 2024 Right lower lobe nodular opacities which seem to be more solidified in the current CT scan Atypical infection can also look like this however we will proceed with notify lung serum blood test to assess high risk malignancy    Current Medication:  Current Outpatient Medications:    albuterol (VENTOLIN HFA) 108 (90 Base) MCG/ACT inhaler, INHALE 2 INHALATIONS BY MOUTH  EVERY 6 HOURS AS NEEDED FOR  WHEEZING, Disp: 34 g, Rfl: 3   azelastine (ASTELIN) 0.1 % nasal spray, Place into both nostrils 2 (two) times daily. Use in each nostril as directed, Disp: , Rfl:    Fluticasone-Umeclidin-Vilant (TRELEGY ELLIPTA) 200-62.5-25 MCG/ACT AEPB, Inhale 1 puff into the lungs daily., Disp: 180 each, Rfl: 1   ipratropium-albuterol (DUONEB) 0.5-2.5 (3) MG/3ML SOLN, Take 3 mLs by nebulization every 4 (four) hours as needed., Disp: 360 mL, Rfl: 3   levothyroxine (SYNTHROID, LEVOTHROID) 112 MCG tablet, Take 112 mcg by mouth daily before breakfast., Disp: , Rfl:    montelukast (SINGULAIR) 10 MG tablet, TAKE 1 TABLET BY MOUTH DAILY, Disp: 90 tablet, Rfl: 3   rosuvastatin (CRESTOR) 10 MG tablet, Take 10 mg by mouth daily., Disp: , Rfl:    vitamin B-12 (CYANOCOBALAMIN) 1000 MCG tablet, Take 1,000 mcg by mouth daily., Disp: , Rfl:  Vitamin D, Cholecalciferol, 10 MCG (400 UNIT) TABS, Take 1 tablet by mouth daily., Disp: , Rfl:    oxybutynin (DITROPAN-XL) 5 MG 24 hr tablet, Take 5 mg by mouth daily., Disp: , Rfl:     ALLERGIES   Penicillins      Blood pressure 126/74, pulse 73, temperature 97.6 F (36.4 C), temperature source Temporal, height 5\' 9"  (1.753 m), weight 185 lb (83.9 kg), SpO2 94 %.      Review of Systems: Gen:  Denies  fever,  sweats, chills weight loss  HEENT: Denies blurred vision, double vision, ear pain, eye pain, hearing loss, nose bleeds, sore throat Cardiac:  No dizziness, chest pain or heaviness, chest tightness,edema, No JVD Resp:   No cough, -sputum production, -shortness of breath,-wheezing, -hemoptysis,  Other:  All other systems negative   Physical Examination:   General Appearance: No distress  EYES PERRLA, EOM intact.   NECK Supple, No JVD Pulmonary: normal breath sounds, No wheezing.  CardiovascularNormal S1,S2.  No m/r/g.   Abdomen: Benign, Soft, non-tender. Neurology UE/LE 5/5 strength, no focal deficits Ext pulses intact, cap refill intact ALL OTHER ROS ARE NEGATIVE    CT chest 3.15.19 Emphysematous changes, pulmonary scarring and patchy areas of peribronchial thickening and minimal bronchiectasis. No suspicious nodules, masses  CT chest 05/2022 CT chest 05/2022      Focal marked peribronchial thickening and mild nodularity in the medial aspect of the right lower lobe is slightly less patchy with a better defined nodule currently measuring 8 mm in maximum diameter, previously 9 mm.  ASSESSMENT/PLAN   67 year old pleasant white male seen today for follow-up severe end-stage COPD FEV1 24% predicted Gold stage D associated with chronic respiratory insufficiency and underlying sleep apnea in the setting of abnormal CT chest recently found on CT scan February 2024 with a right lower lobe nodular opacity    Severe COPD  No exacerbation at this time  Will continue Trelegy 200  Rinse mouth after every use  Albuterol as needed    OSA Excellent compliance report 100% days and greater than 4 hours usage Well-controlled with CPAP therapy AHI reduced to 0.1  Respiratory insufficiency with shortness of breath dyspnea exertion Definitely related to COPD and deconditioned state Previous minute walk test did not show exertional hypoxia Will place pulmonary rehab referral Patient  did not do a rehab referral previously due to cost   Obesity -recommend significant weight loss -recommend changing diet  Deconditioned state -Recommend increased daily activity and exercise  Abnormal CT chest  New cluster of ill-defined nodules in the medial right lower lobe with dominant nodule measuring 9 mm diameter. Patient was given doxycycline for atypical pneumonia April 2024 compared to February 2024 Right lower lobe nodular opacities which seem to be more solidified in the current CT scan Atypical infection can also look like this however we will proceed with notify lung serum blood test to assess high risk malignancy   MEDICATION ADJUSTMENTS/LABS AND TESTS ORDERED: Continue CPAP as prescribed EXCELLENT JOB A+ REPEAT CT CHEST PENDING JUNE  2024 NODIFY LUNG SERUM BLOOD TEST CONTINUE TRELEGY INHALER 200 PULM REHAB REFERRAL  Avoid secondhand smoke Avoid SICK contacts Recommend  Masking  when appropriate Recommend Keep up-to-date with vaccinations  Patient satisfied with Plan of action and management. All questions answered  Follow-up in 3 months  Total time spent 47 mins  Koron Godeaux Santiago Glad, M.D.  [facility]  Medical Director [facility] Medical Director [facility] Cardio-Pulmonary Department

## 2022-08-11 NOTE — Patient Instructions (Addendum)
Continue CPAP as prescribed EXCELLENT JOB A+   REPEAT CT CHEST PENDING JUNE  2024 NODIFY LUNG SERUM BLOOD TEST    CONTINUE TRELEGY INHALER 200 PULM REHAB REFERRAL  Avoid secondhand smoke Avoid SICK contacts Recommend  Masking  when appropriate Recommend Keep up-to-date with vaccinations

## 2022-08-15 IMAGING — US US ABDOMEN LIMITED
1 series · 14 of 25 positions shown · non-contrast
Comparison: CT scan July 23, 2020.  Ultrasound February 21, 2020.

CLINICAL DATA: Liver cirrhosis.

EXAM:
ULTRASOUND ABDOMEN LIMITED RIGHT UPPER QUADRANT

[Series 1: us abdomen limited ruq (liver/gb) · 14 of 43 slices shown]
[im 1/43]
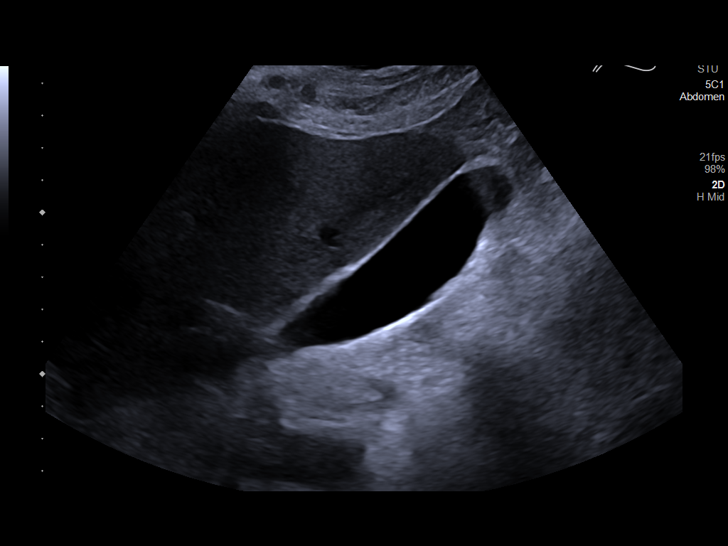
[im 4/43]
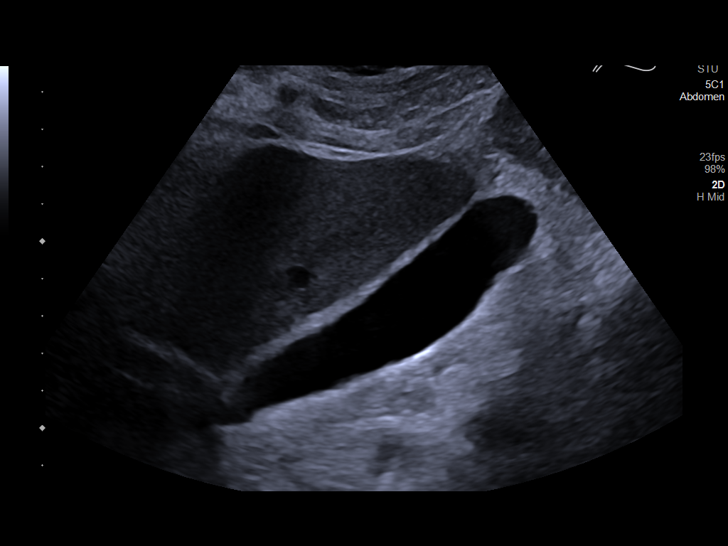
[im 8/43]
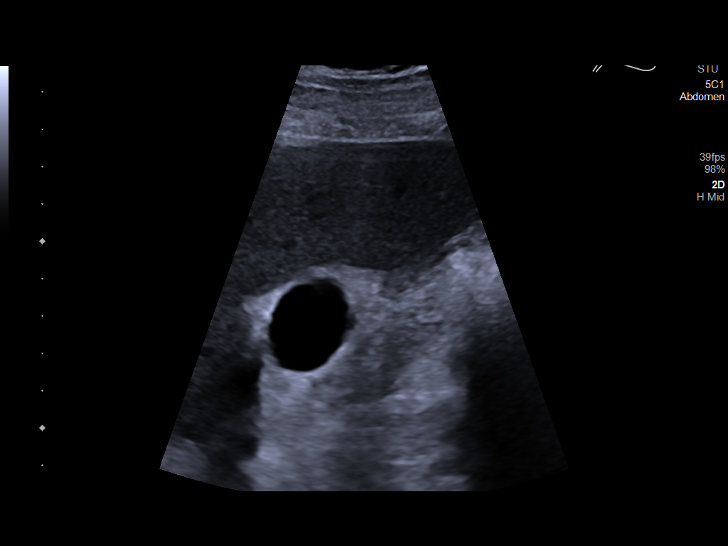
[im 11/43]
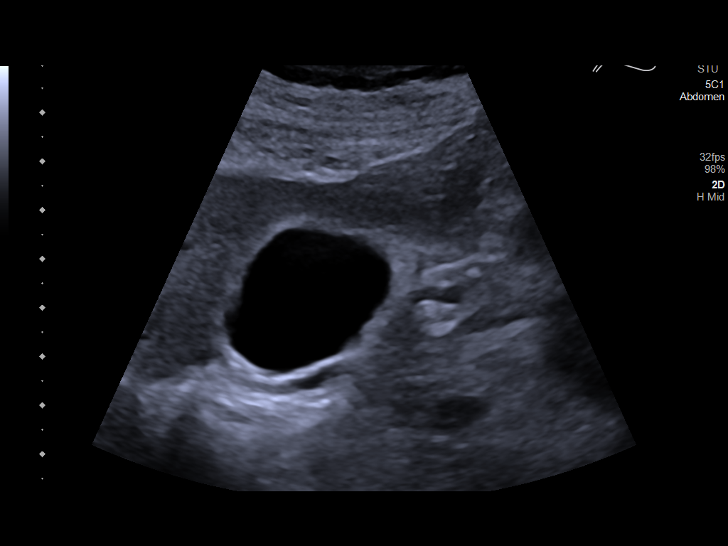
[im 15/43]
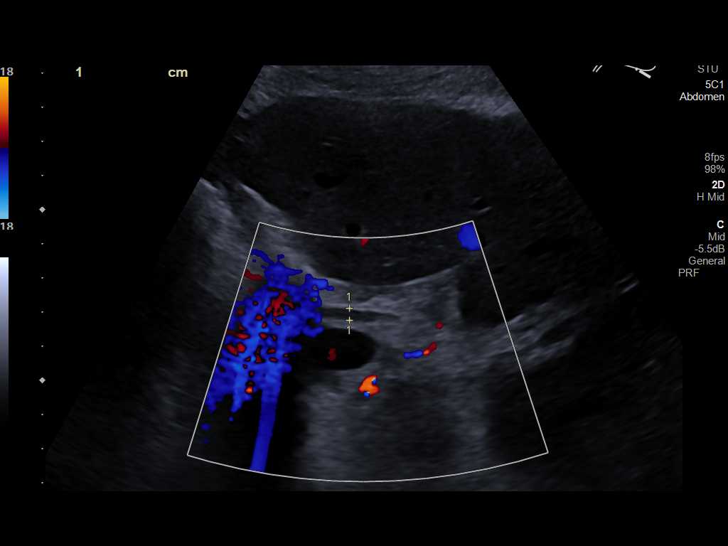
[im 16/43]
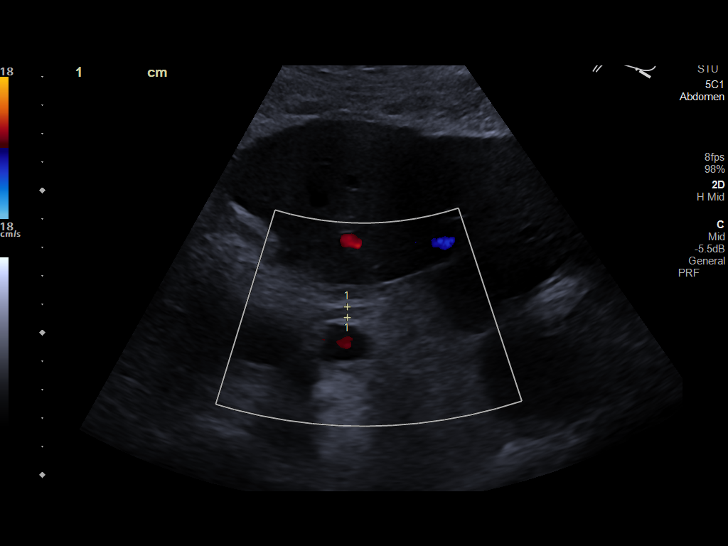
[im 20/43]
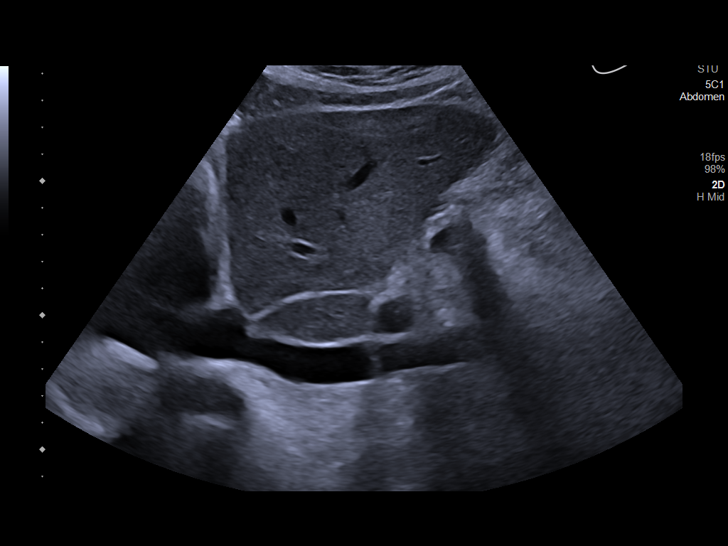
[im 23/43]
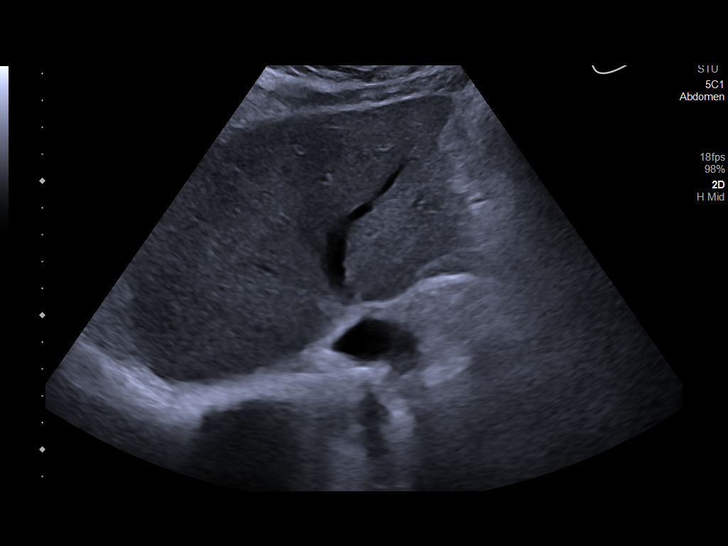
[im 27/43]
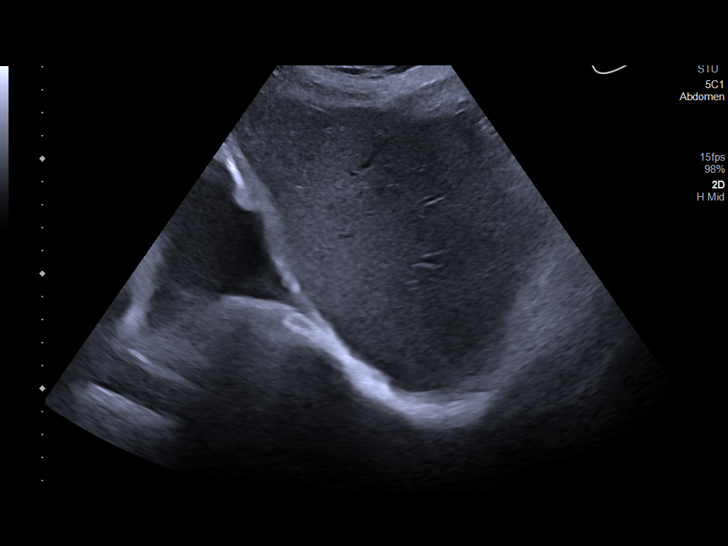
[im 29/43]
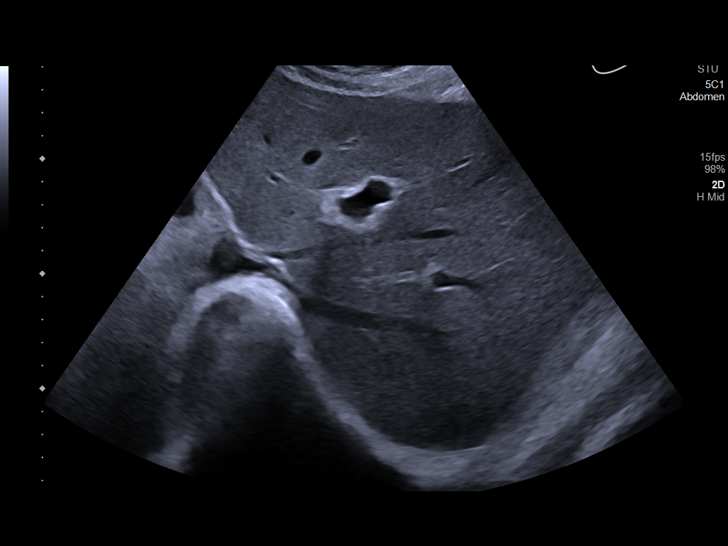
[im 32/43]
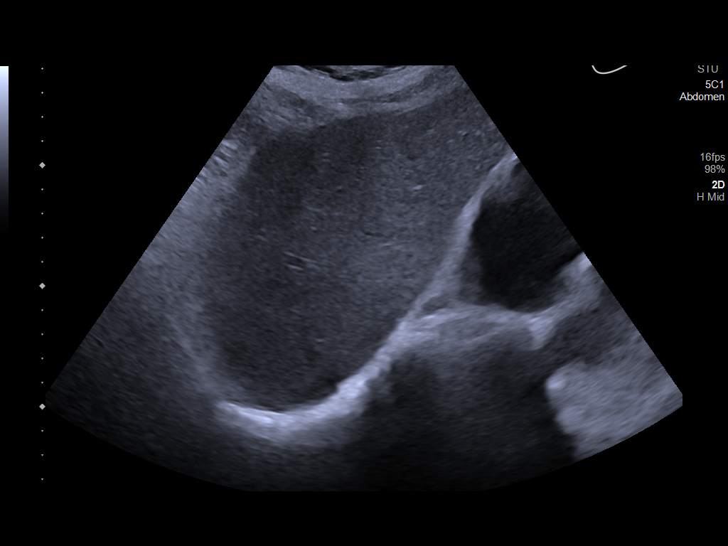
[im 36/43]
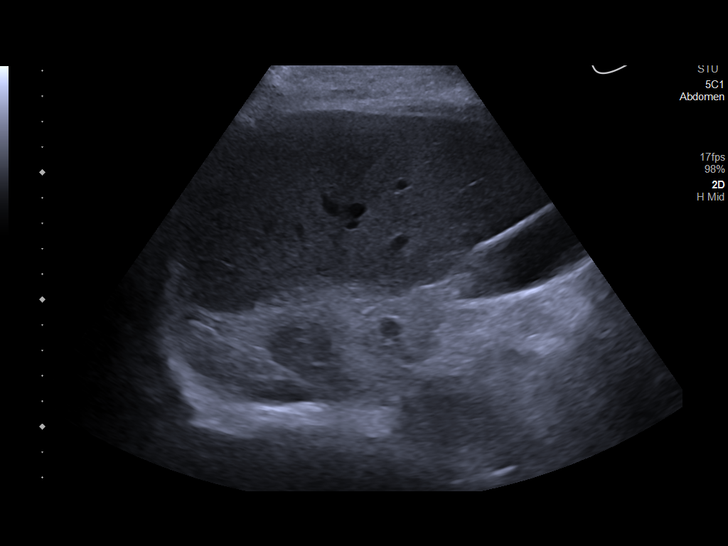
[im 39/43]
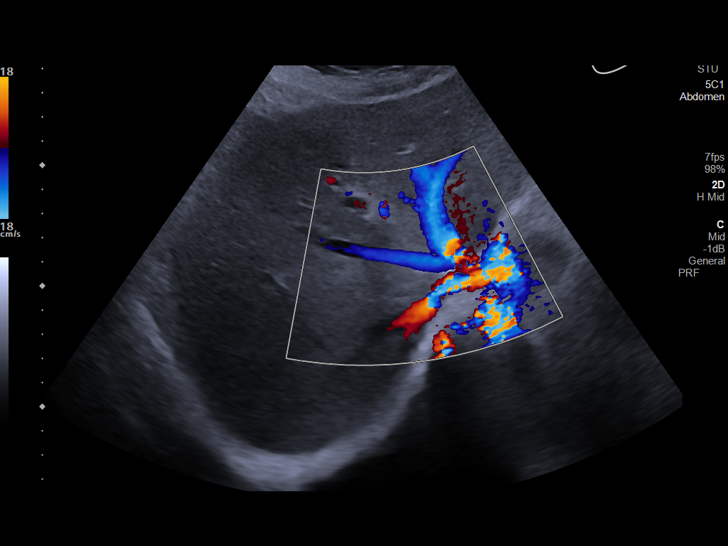
[im 43/43]
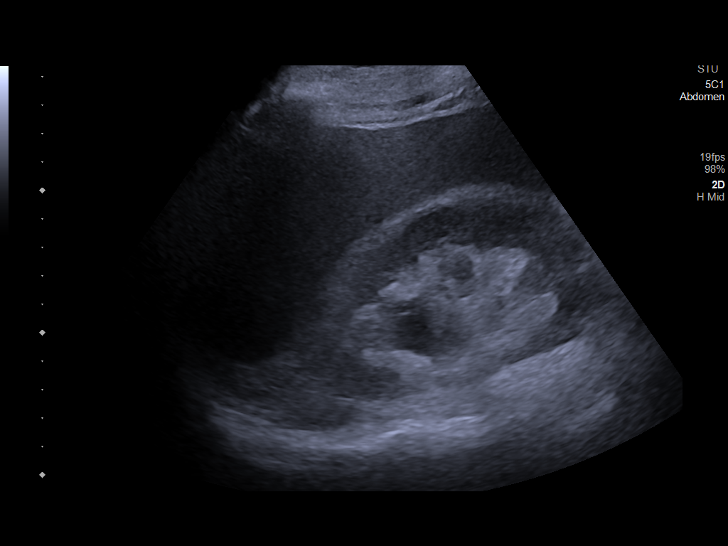

[14 of 25 positions shown; findings below may reference images not displayed]

FINDINGS: Gallbladder:

No gallstones or wall thickening visualized. No sonographic Murphy
sign noted by sonographer.

Common bile duct:

Diameter: 3.5 mm

Liver:

No focal lesion identified. Within normal limits in parenchymal
echogenicity. Portal vein is patent on color Doppler imaging with
normal direction of blood flow towards the liver.

Other: Known parapelvic cysts are identified in the right kidney.
IMPRESSION: 1. No significant abnormalities. No liver mass or nodularity
identified.

## 2022-08-18 ENCOUNTER — Telehealth: Payer: Self-pay

## 2022-08-18 NOTE — Telephone Encounter (Signed)
Nodify test reviewed by Dr. Belia Heman- normal test, will continue to monitor with CT's.    Patient is aware of results and voiced his understanding.  Nothing further needed.

## 2022-09-01 MED ORDER — TRELEGY ELLIPTA 200-62.5-25 MCG/ACT IN AEPB: 1.0000 | INHALATION_SPRAY | Freq: Every day | RESPIRATORY_TRACT | 5 refills | Status: DC

## 2022-09-08 ENCOUNTER — Encounter: Payer: Self-pay | Admitting: Pulmonary Disease

## 2022-11-09 NOTE — Progress Notes (Signed)
This encounter was created in error - please disregard.

## 2022-11-23 ENCOUNTER — Ambulatory Visit: Payer: Medicare Other

## 2022-11-24 ENCOUNTER — Ambulatory Visit
Admission: RE | Admit: 2022-11-24 | Discharge: 2022-11-24 | Disposition: A | Discharge status: Home / Self Care | Payer: Medicare Other | Source: Ambulatory Visit | Attending: Internal Medicine | Admitting: Internal Medicine

## 2022-11-24 DIAGNOSIS — R911 Solitary pulmonary nodule: Secondary | ICD-10-CM | POA: Insufficient documentation

## 2022-12-01 ENCOUNTER — Ambulatory Visit: Payer: Medicare Other | Admitting: Internal Medicine

## 2022-12-02 NOTE — Progress Notes (Signed)
[facility]  Pulmonary Medicine Consultation      Date: 12/02/2022,   MRN# [id number] MUSTAFA BELSHAW III [birth year]  BRENHAM RAYNER III is a 67 y.o. old male seen in consultation for COPD   Synopsis estabished care in 2017 for Severe COPD PFT's 10/2015 Fev1 28% 1.03L  WNL 95% o2 sat at 1083 feet Bode index calculated 57% 4-year survival rate   CT chest 3.15.19 Reviewed with patient +emphysema No suspicious nodules, massess No effusions No pneumonia  CT chest 05/2022 Right upper lobe nodular densities Patient given antibiotics  Previous compliance report  compliance report shared with patient AHI 0.1 100% compliance AUTOCPAP 7-18c hes  **12/2019 compliance report Excellent compliance report AHI down to 0.1  **07/09/2020 Excellent compliance report 100% for days Greater than 97% percent for over 4 hours Auto CPAP 7 to 18 cm water pressure AHI 0.1  **12/2020 Excellent compliance report 100% for days 97% greater than 4 hours AHI reduced to 0.05 Aug 2022 for download Excellent compliance report 100% AHI reduced to 0.1   CHIEF COMPLAINT:     Follow-up assessment OSA  Follow-up assessment COPD  Follow-up assessment abnormal CT chest      HISTORY OF PRESENT ILLNESS   Re: COPD  No exacerbation at this time No evidence of heart failure at this time No evidence or signs of infection at this time No respiratory distress No fevers, chills, nausea, vomiting, diarrhea No evidence of lower extremity edema No evidence hemoptysis  COPD seems to be stable at this time  His shortness of breath and dyspnea exertion has progressively worsened since July 2022 when he was diagnosed with COVID-19 infection Exercise tolerance and capacity has diminished over time - PULM REHAB REFERRAL  PFTs reviewed in detail with patient FEV1 24% predicted Very severe obstructive pulmonary disease Stays active playing golf   Had been on Symbicort and Spiriva  But currently on  Trelegy 200 and seems to be helping as well Patient would like to continue Trelegy as prescribed   Regarding OSA Excellent compliance report Patient uses and benefits from CPAP therapy CPAP download reviewed in detail Sleep study in 2017 showed AHI of 58 Current CPAP download shows excellent compliance AHI reduced to 0.1 Auto CPAP 6-18   Regarding weight Last office visit weight 195 pounds Current weight 183   Abnormal CT chest April 2024 compared to February 2024 Right lower lobe nodular opacities which seem to be more solidified in the current CT scan Notify lung serum blood test was within normal limits Patient is high risk for malignancy Follow-up CT chest August 2024 shows residual right lower lobe nodule opacities which have not significantly changed over the last 6 months   Current Medication:  Current Outpatient Medications:    albuterol (VENTOLIN HFA) 108 (90 Base) MCG/ACT inhaler, INHALE 2 INHALATIONS BY MOUTH  EVERY 6 HOURS AS NEEDED FOR  WHEEZING, Disp: 34 g, Rfl: 3   azelastine (ASTELIN) 0.1 % nasal spray, Place into both nostrils 2 (two) times daily. Use in each nostril as directed, Disp: , Rfl:    Fluticasone-Umeclidin-Vilant (TRELEGY ELLIPTA) 200-62.5-25 MCG/ACT AEPB, Inhale 1 puff into the lungs daily., Disp: 180 each, Rfl: 5   ipratropium-albuterol (DUONEB) 0.5-2.5 (3) MG/3ML SOLN, Take 3 mLs by nebulization every 4 (four) hours as needed., Disp: 360 mL, Rfl: 3   levothyroxine (SYNTHROID, LEVOTHROID) 112 MCG tablet, Take 112 mcg by mouth daily before breakfast., Disp: , Rfl:    montelukast (SINGULAIR) 10 MG tablet, TAKE  1 TABLET BY MOUTH DAILY, Disp: 90 tablet, Rfl: 3   oxybutynin (DITROPAN-XL) 5 MG 24 hr tablet, Take 5 mg by mouth daily., Disp: , Rfl:    rosuvastatin (CRESTOR) 10 MG tablet, Take 10 mg by mouth daily., Disp: , Rfl:    vitamin B-12 (CYANOCOBALAMIN) 1000 MCG tablet, Take 1,000 mcg by mouth daily., Disp: , Rfl:    Vitamin D, Cholecalciferol, 10  MCG (400 UNIT) TABS, Take 1 tablet by mouth daily., Disp: , Rfl:     ALLERGIES   Penicillins      There were no vitals taken for this visit.       Review of Systems: Gen:  Denies  fever, sweats, chills weight loss  HEENT: Denies blurred vision, double vision, ear pain, eye pain, hearing loss, nose bleeds, sore throat Cardiac:  No dizziness, chest pain or heaviness, chest tightness,edema, No JVD Resp:   No cough, -sputum production, +shortness of breath,-wheezing, -hemoptysis,  Other:  All other systems negative   Physical Examination:   General Appearance: No distress  EYES PERRLA, EOM intact.   NECK Supple, No JVD Pulmonary: normal breath sounds, No wheezing.  CardiovascularNormal S1,S2.  No m/r/g.   Abdomen: Benign, Soft, non-tender. Neurology UE/LE 5/5 strength, no focal deficits Ext pulses intact, cap refill intact ALL OTHER ROS ARE NEGATIVE    CT chest 3.15.19 Emphysematous changes, pulmonary scarring and patchy areas of peribronchial thickening and minimal bronchiectasis. No suspicious nodules, masses  CT chest 05/2022 CT chest 05/2022      Focal marked peribronchial thickening and mild nodularity in the medial aspect of the right lower lobe is slightly less patchy with a better defined nodule currently measuring 8 mm in maximum diameter, previously 9 mm.   CT chest August 2024 Right lower lobe nodular densities persistent however stable No significant changes     ASSESSMENT/PLAN   67 year old pleasant white male seen today for follow-up assessment for severe end-stage COPD FEV1 24% predicted Gold stage D associated with chronic respiratory insufficiency underlying sleep apnea in the setting of abnormal CT chest recently found on CT scan February 2024 with right lower lobe nodular opacifications    Severe end-stage COPD  FEV1 24% No exacerbation at this time  Will continue Trelegy 200  Rinse mouth after every use  Albuterol as needed     OSA Excellent compliance report 100% days and greater than 4 hours usage Well-controlled with CPAP therapy AHI reduced to 0.1 Sleep study AHI 2017 was 58  Respiratory insufficiency with shortness of breath dyspnea exertion Definitely related to COPD and deconditioned state Pulmonary rehab referral placed last visit   Obesity -recommend significant weight loss -recommend changing diet Regarding weight Last office visit weight 195 pounds Current weight 183   Deconditioned state -Recommend increased daily activity and exercise  Respiratory insufficiency Pulmonary rehab referral placed last office visit  Abnormal CT chest  New cluster of ill-defined nodules in the medial right lower lobe with dominant nodule measuring 9 mm diameter. The CT chest has not changed over the last 6 months Images reviewed from February and August 2024 Images reviewed in detail  Patient was given doxycycline for atypical pneumonia April 2024 compared to February 2024 Notify  serum test was within normal limits    MEDICATION ADJUSTMENTS/LABS AND TESTS ORDERED: Continue CPAP as prescribed EXCELLENT JOB A+ CONTINUE TRELEGY INHALER 200 Avoid secondhand smoke Avoid SICK contacts Recommend  Masking  when appropriate Recommend Keep up-to-date with vaccinations  Patient satisfied with Plan of action  and management. All questions answered  Follow-up in 3 months  Time spent 45 minutes  Randolf Sansoucie Santiago Glad, M.D.  [facility]  Medical Director [facility] Medical Director [facility] Cardio-Pulmonary Department

## 2022-12-03 ENCOUNTER — Encounter: Payer: Self-pay | Admitting: Internal Medicine

## 2022-12-03 ENCOUNTER — Ambulatory Visit: Payer: Medicare Other | Admitting: Internal Medicine

## 2022-12-03 ENCOUNTER — Telehealth: Payer: Self-pay

## 2022-12-03 VITALS — BP 120/60 | HR 68 | Temp 98.0°F | Ht 69.0 in | Wt 189.4 lb

## 2022-12-03 DIAGNOSIS — G4733 Obstructive sleep apnea (adult) (pediatric): Secondary | ICD-10-CM | POA: Diagnosis not present

## 2022-12-03 DIAGNOSIS — R0689 Other abnormalities of breathing: Secondary | ICD-10-CM | POA: Diagnosis not present

## 2022-12-03 DIAGNOSIS — R911 Solitary pulmonary nodule: Secondary | ICD-10-CM | POA: Diagnosis not present

## 2022-12-03 DIAGNOSIS — J449 Chronic obstructive pulmonary disease, unspecified: Secondary | ICD-10-CM

## 2022-12-03 MED ORDER — AZITHROMYCIN 500 MG PO TABS
500.0000 mg | ORAL_TABLET | Freq: Every day | ORAL | 2 refills | Status: AC
Start: 2022-12-03 — End: 2023-06-01

## 2022-12-03 NOTE — Patient Instructions (Addendum)
Continue CPAP as prescribed EXCELLENT JOB A+ CONTINUE TRELEGY INHALER 200 Avoid secondhand smoke Avoid SICK contacts Recommend  Masking  when appropriate Recommend Keep up-to-date with vaccinations  Follow-up CT chest in 6 months  START AZITHROMYCIN 500 MG Monday, Wednesday, Friday  Patient Instructions  Continue to use CPAP every night, minimum of 4-6 hours a night.  Change equipment every 30 days or as directed by DME. Wash your tubing with warm soap and water daily, hang to dry. Wash humidifier portion weekly. Use bottled, distilled water and change daily  Be aware of reduced alertness and do not drive or operate heavy machinery if experiencing this or drowsiness.  Exercise encouraged, as tolerated. Healthy weight management discussed.  Notify if persistent daytime sleepiness occurs even with consistent use of CPAP. Untreated sleep apnea puts an individual at risk for cardiac arrhthymias, pulm HTN, DM, stroke and increases their risk for daytime accidents.

## 2022-12-03 NOTE — Telephone Encounter (Signed)
Called [employer] and request DL. Will hold message to ensure follow up.

## 2022-12-08 NOTE — Telephone Encounter (Signed)
Download placed in Dr. Clovis Fredrickson folder for review.  Patient is aware that SD card has been placed up front for pickup. Nothing further needed.

## 2022-12-08 NOTE — Telephone Encounter (Signed)
Patient dropped off SD card. Unable to obtain download at this time. [employer] is down, however IT is working on the issue. Patient left card at office and would like a call to pickup once download has been obtained.

## 2022-12-15 NOTE — Telephone Encounter (Signed)
Download reviewed by Dr. Lenoria Chime  Lm for patient to make him aware.

## 2022-12-28 ENCOUNTER — Ambulatory Visit
Admission: RE | Admit: 2022-12-28 | Discharge: 2022-12-28 | Disposition: A | Discharge status: Home / Self Care | Payer: Medicare Other | Source: Ambulatory Visit | Attending: Gastroenterology

## 2022-12-28 DIAGNOSIS — K7460 Unspecified cirrhosis of liver: Secondary | ICD-10-CM

## 2023-01-04 ENCOUNTER — Other Ambulatory Visit: Payer: Medicare Other

## 2023-01-04 ENCOUNTER — Other Ambulatory Visit: Payer: Self-pay | Admitting: Gastroenterology

## 2023-01-04 DIAGNOSIS — K7460 Unspecified cirrhosis of liver: Secondary | ICD-10-CM

## 2023-02-23 ENCOUNTER — Telehealth: Payer: Self-pay

## 2023-02-23 ENCOUNTER — Ambulatory Visit: Payer: Medicare Other | Admitting: Internal Medicine

## 2023-02-23 ENCOUNTER — Encounter: Payer: Self-pay | Admitting: Internal Medicine

## 2023-02-23 VITALS — BP 104/68 | HR 70 | Temp 98.3°F | Ht 69.0 in | Wt 188.0 lb

## 2023-02-23 DIAGNOSIS — R918 Other nonspecific abnormal finding of lung field: Secondary | ICD-10-CM | POA: Diagnosis not present

## 2023-02-23 DIAGNOSIS — R0689 Other abnormalities of breathing: Secondary | ICD-10-CM

## 2023-02-23 DIAGNOSIS — J449 Chronic obstructive pulmonary disease, unspecified: Secondary | ICD-10-CM

## 2023-02-23 DIAGNOSIS — G4733 Obstructive sleep apnea (adult) (pediatric): Secondary | ICD-10-CM

## 2023-02-23 MED ORDER — OHTUVAYRE 3 MG/2.5ML IN SUSP: 2.5000 mL | Freq: Two times a day (BID) | RESPIRATORY_TRACT | Status: DC

## 2023-02-23 NOTE — Telephone Encounter (Signed)
Enrollment form received, will begin BIV in separate encounter

## 2023-02-23 NOTE — Telephone Encounter (Signed)
Received New start paperwork for [facility]. Missing sections of enrollment form completed and faxed in to [facility]. Will await further correspondence.   Phone#: [phone] Fax#: [phone]

## 2023-02-23 NOTE — Telephone Encounter (Signed)
Ohtuvayre Rx form completed and faxed to [facility] pharmacy team.

## 2023-02-23 NOTE — Telephone Encounter (Signed)
Pt contacted office for f/u, was under the impression that he was simply going to pick up medication from a local pharmacy. Discussed the [place] [facility] process with pt and the individual hoops that would need to be jumped through with the intention of ensuring the medication is as affordable as possible (if not free) to pt. Pt verbalized understanding and appreciation.

## 2023-02-23 NOTE — Progress Notes (Signed)
[facility] Pulmonary Medicine Consultation      Date: 02/23/2023,   MRN# [id number] David [name] Sep 26, 1955  David [name] is a 67 y.o. old male seen in consultation for COPD   Synopsis estabished care in 2017 for Severe COPD PFT's 10/2015 Fev1 28% 1.03L  WNL 95% o2 sat at 1083 feet Bode index calculated 57% 4-year survival rate   CT chest 3.15.19 Reviewed with patient +emphysema No suspicious nodules, massess No effusions No pneumonia  CT chest 05/2022 Right upper lobe nodular densities Patient given antibiotics  Previous compliance report  compliance report shared with patient AHI 0.1 100% compliance AUTOCPAP 7-18c hes  **12/2019 compliance report Excellent compliance report AHI down to 0.1  **07/09/2020 Excellent compliance report 100% for days Greater than 97% percent for over 4 hours Auto CPAP 7 to 18 cm water pressure AHI 0.1  **12/2020 Excellent compliance report 100% for days 97% greater than 4 hours AHI reduced to 0.05 Aug 2022 for download Excellent compliance report 100% AHI reduced to 0.1   CHIEF COMPLAINT:     Follow-up assessment OSA  Follow-up assessment COPD  Follow-up assessment abnormal CT chest      HISTORY OF PRESENT ILLNESS   Re: COPD  No exacerbation at this time No evidence of heart failure at this time No evidence or signs of infection at this time No respiratory distress No fevers, chills, nausea, vomiting, diarrhea No evidence of lower extremity edema No evidence hemoptysis   COPD seems to be stable at this time  His shortness of breath and dyspnea exertion has progressively worsened since July 2022 when he was diagnosed with COVID-19 infection Exercise tolerance and capacity has diminished over time - PULM REHAB REFERRAL Plan to start new NEB therapy with Sharyn Blitz   PFT's reviewed in detail with patient again today 02/23/2023 Very severe obstructive airways disease FEV1 24% predicted Decreased  exercise tolerance Increased WOB   Currently Trelegy seems to be helping Patient would like to continue Trelegy as prescribed   Regarding OSA Excellent compliance report Patient use and benefits from CPAP therapy Sleep study in 2017 showed AHI 58 Current CPAP download showed excellent compliance Significant reduction of AHI Auto CPAP 6-18 AHi reduced to 0.1    Regarding weight Previous weight 195 pounds Last office visit 183 pounds Current weight 188 pounds   Regarding abnormal CT chest I independently reviewed CT chest today 02/23/2023 April 2024 CT chest with February 2024 CT chest Right lower lobe nodule opacities seem to be more solidified and current CT scan Notify lung serum blood test were within normal limits Patient is at high risk for malignancy Follow-up CT chest August 2024 shows residual right lower lobe opacity No significant change over the last 6 months    Current Medication:  Current Outpatient Medications:    albuterol (VENTOLIN HFA) 108 (90 Base) MCG/ACT inhaler, INHALE 2 INHALATIONS BY MOUTH  EVERY 6 HOURS AS NEEDED FOR  WHEEZING, Disp: 34 g, Rfl: 3   azelastine (ASTELIN) 0.1 % nasal spray, Place into both nostrils 2 (two) times daily. Use in each nostril as directed, Disp: , Rfl:    azithromycin (ZITHROMAX) 500 MG tablet, Take 1 tablet (500 mg total) by mouth daily. Take 1 tablet ON MONDAY, WEDNESDAYS, FRIDAYS, Disp: 60 tablet, Rfl: 2   Fluticasone-Umeclidin-Vilant (TRELEGY ELLIPTA) 200-62.5-25 MCG/ACT AEPB, Inhale 1 puff into the lungs daily., Disp: 180 each, Rfl: 5   ipratropium-albuterol (DUONEB) 0.5-2.5 (3) MG/3ML SOLN, Take 3 mLs by  nebulization every 4 (four) hours as needed., Disp: 360 mL, Rfl: 3   levothyroxine (SYNTHROID, LEVOTHROID) 112 MCG tablet, Take 112 mcg by mouth daily before breakfast., Disp: , Rfl:    montelukast (SINGULAIR) 10 MG tablet, TAKE 1 TABLET BY MOUTH DAILY, Disp: 90 tablet, Rfl: 3   oxybutynin (DITROPAN-XL) 5 MG 24 hr  tablet, Take 5 mg by mouth daily., Disp: , Rfl:    rosuvastatin (CRESTOR) 10 MG tablet, Take 10 mg by mouth daily., Disp: , Rfl:    vitamin B-12 (CYANOCOBALAMIN) 1000 MCG tablet, Take 1,000 mcg by mouth daily., Disp: , Rfl:    Vitamin D, Cholecalciferol, 10 MCG (400 UNIT) TABS, Take 1 tablet by mouth daily., Disp: , Rfl:     ALLERGIES   Penicillins   BP 104/68 (BP Location: Left Arm, Patient Position: Sitting, Cuff Size: Normal)   Pulse 70   Temp 98.3 F (36.8 C) (Oral)   Ht 5\' 9"  (1.753 m)   Wt 188 lb (85.3 kg)   SpO2 95%   BMI 27.76 kg/m       Review of Systems: Gen:  Denies  fever, sweats, chills weight loss  HEENT: Denies blurred vision, double vision, ear pain, eye pain, hearing loss, nose bleeds, sore throat Cardiac:  No dizziness, chest pain or heaviness, chest tightness,edema, No JVD Resp:   No cough, -sputum production, +shortness of breath,-wheezing, -hemoptysis, +DOE Other:  All other systems negative   Physical Examination:   General Appearance: No distress  EYES PERRLA, EOM intact.   NECK Supple, No JVD Pulmonary: normal breath sounds, No wheezing.  CardiovascularNormal S1,S2.  No m/r/g.   Abdomen: Benign, Soft, non-tender. Neurology UE/LE 5/5 strength, no focal deficits Ext pulses intact, cap refill intact ALL OTHER ROS ARE NEGATIVE     CT chest 3.15.19 Emphysematous changes, pulmonary scarring and patchy areas of peribronchial thickening and minimal bronchiectasis. No suspicious nodules, masses  CT chest 05/2022 CT chest 05/2022      Focal marked peribronchial thickening and mild nodularity in the medial aspect of the right lower lobe is slightly less patchy with a better defined nodule currently measuring 8 mm in maximum diameter, previously 9 mm.   CT chest August 2024 Right lower lobe nodular densities persistent however stable No significant changes         ASSESSMENT/PLAN   67 year old pleasant white male seen today for  follow-up assessment for severe end-stage COPD FEV1 24% predicted with Gold stage D associate with chronic respiratory sufficiency underlying severe sleep apnea in the setting of abnormal CT chest right lower lobe nodular opacification  End-stage COPD FEV1 24% predicted No exacerbation at this time Continue Trelegy Will add nebulized therapy with Ohtuvayre Rinse mouth after every use  Albuterol as needed   Regarding OSA Compliance report reviewed in detail with patient 100% for days and greater than 4 hours Well-controlled on CPAP Significant reduction of AHI Sleep study 2017 AHI was 58 AHI redcued to 0.1  OSA well controlled with current prescription AUTO 6-18  Regarding respiratory insufficiency Pulmonary rehab referral placed last visit Related to COPD and deconditioned state Will try nebulized therapy as maintenance therapy  Obesity -recommend significant weight loss -recommend changing diet Previous weight 195 pounds Last office visit weight 183 pounds Current weight is  Deconditioned state -Recommend increased daily activity and exercise  Abnormal CT chest Images reviewed in detail with the patient today independently Patient was given doxycycline for atypical pneumonia April 2024 compared to February 2024 Notify  serum  test was within normal limits  Follow up with Insurance plan for Pulmonary Rehab  MEDICATION ADJUSTMENTS/LABS AND TESTS ORDERED: Continue CPAP as prescribed  CONTINUE TRELEGY INHALER 200 Avoid secondhand smoke Avoid SICK contacts Recommend  Masking  when appropriate Recommend Keep up-to-date with vaccinations Start nebulized therapy with Ohtuvayre Follow up with Insurance plan for Pulmonary Rehab   Patient satisfied with Plan of action and management. All questions answered  Follow up in 2 months   Total Time Spent 45 mins  Wallis Bamberg Santiago Glad, M.D.  [facility]  Medical Director [facility]  [facility] Medical Director [facility] Cardio-Pulmonary Department

## 2023-02-23 NOTE — Patient Instructions (Addendum)
Excellent Job A+ GOLD STAR! Continue CPAP as prescribed  Continue inhalers as prescribed  Start Ohtuvayre Nebulizer twice daily  Avoid secondhand smoke Avoid SICK contacts Recommend  Masking  when appropriate Recommend Keep up-to-date with vaccinations  Recommend follow up CT chest in 6 months  Follow up with Insurance plan for Pulmonary Rehab

## 2023-02-24 NOTE — Telephone Encounter (Signed)
Received fax from [facility] [facility] that patient is enrolled into pathway program. Rx will be triaged [facility] [phone])  Phone#: [phone] Patient ID: [id number]  Chesley Mires, PharmD, MPH, BCPS, CPP Clinical Pharmacist (Rheumatology and Pulmonology)

## 2023-04-06 ENCOUNTER — Other Ambulatory Visit: Payer: Self-pay | Admitting: Internal Medicine

## 2023-04-06 DIAGNOSIS — J449 Chronic obstructive pulmonary disease, unspecified: Secondary | ICD-10-CM

## 2023-04-06 DIAGNOSIS — R0689 Other abnormalities of breathing: Secondary | ICD-10-CM

## 2023-04-07 MED ORDER — OHTUVAYRE 3 MG/2.5ML IN SUSP: 2.5000 mL | Freq: Two times a day (BID) | RESPIRATORY_TRACT | 1 refills | Status: DC

## 2023-05-05 ENCOUNTER — Encounter: Payer: Self-pay | Admitting: Internal Medicine

## 2023-05-05 ENCOUNTER — Ambulatory Visit (INDEPENDENT_AMBULATORY_CARE_PROVIDER_SITE_OTHER): Payer: Medicare Other | Admitting: Internal Medicine

## 2023-05-05 VITALS — BP 102/60 | HR 82 | Temp 97.6°F | Ht 69.0 in | Wt 196.8 lb

## 2023-05-05 DIAGNOSIS — R9389 Abnormal findings on diagnostic imaging of other specified body structures: Secondary | ICD-10-CM

## 2023-05-05 DIAGNOSIS — J449 Chronic obstructive pulmonary disease, unspecified: Secondary | ICD-10-CM | POA: Diagnosis not present

## 2023-05-05 DIAGNOSIS — R0689 Other abnormalities of breathing: Secondary | ICD-10-CM

## 2023-05-05 DIAGNOSIS — G4733 Obstructive sleep apnea (adult) (pediatric): Secondary | ICD-10-CM

## 2023-05-05 NOTE — Patient Instructions (Addendum)
Continue inhalers and nebulized therapy as prescribed  Excellent job on CPAP machine A+  Continue weight loss journey  Avoid Allergens and Irritants Avoid secondhand smoke Avoid SICK contacts Recommend  Masking  when appropriate Recommend Keep up-to-date with vaccinations  Recommend lung cancer screening program-CT chest every 1 year  Recommending cutting down on alcohol intake throughout the week

## 2023-05-05 NOTE — Progress Notes (Signed)
[facility] [place] [facility]monary Medicine Consultation      Date: 05/05/2023,   MRN# [id number] David [name] 1956/03/02  David [name] is a 68 y.o. old male seen in consultation for COPD   Synopsis estabished care in 2017 for Severe COPD PFT's 10/2015 Fev1 28% 1.03L  WNL 95% o2 sat at 1083 feet Bode index calculated 57% 4-year survival rate   CT chest 3.15.19 Reviewed with patient +emphysema No suspicious nodules, massess No effusions No pneumonia  CT chest 05/2022 Right upper lobe nodular densities Patient given antibiotics  Previous compliance report  compliance report shared with patient AHI 0.1 100% compliance AUTOCPAP 7-18c hes  **12/2019 compliance report Excellent compliance report AHI down to 0.1  **07/09/2020 Excellent compliance report 100% for days Greater than 97% percent for over 4 hours Auto CPAP 7 to 18 cm water pressure AHI 0.1  **12/2020 Excellent compliance report 100% for days 97% greater than 4 hours AHI reduced to 0.05 Aug 2022 for download Excellent compliance report 100% AHI reduced to 0.06 April 2023 compliance download AHI reduced to 0.1 Excellent compliance report  CHIEF COMPLAINT:    Follow-up assessment severe COPD Follow-up assessment OSA Follow-up assessment abnormal CT chest    HISTORY OF PRESENT ILLNESS   Re: COPD  No exacerbation at this time No evidence of heart failure at this time No evidence or signs of infection at this time No respiratory distress No fevers, chills, nausea, vomiting, diarrhea No evidence of lower extremity edema No evidence hemoptysis   COPD seems to be stable at this time  His shortness of breath and dyspnea exertion has progressively worsened since July 2022 when he was diagnosed with COVID-19 infection Exercise tolerance and capacity has diminished over time  tried chronic azithromycin therapy in the past patient did not tolerate GI symptoms Recommend continuing phosphodiesterase  inhibitor nebulized therapy  PFTs reviewed in detail with patient again today 05/05/2023 Severe obstructive airways disease FEV1 24% predicted No significant bronchodilator response Decreased DLCO Lung volumes consistent with air trapping    Currently Trelegy seems to be helping Patient would like to continue Trelegy as prescribed Last office visit we started nebulized phosphodiesterase inhibitor therapy At this time the nebulized therapy has helped we will plan to continue as tolerated  Assessment of OSA Excellent compliance report Previous sleep study in 2017 showed AHI of a 58 Auto CPAP 6-18 100% compliance for greater than 4 hours and usage Significant reduction of AHI to 0.1   Regarding weight Previous weight 195 pounds Last office visit 183 pounds Current weight 196 pounds Patient attributes this to loss of dog   Regarding abnormal CT chest I independently reviewed CT chest today 05/05/2023 April 2024 CT chest with February 2024 CT chest Right lower lobe nodule opacities seem to be more solidified and current CT scan Notify lung serum blood test were within normal limits Patient is at high risk for malignancy Follow-up CT chest August 2024 shows residual right lower lobe opacity No significant change over the last 6 months Will refer to patient to lung cancer screening program    Current Medication:  Current Outpatient Medications:    albuterol (VENTOLIN HFA) 108 (90 Base) MCG/ACT inhaler, INHALE 2 INHALATIONS BY MOUTH  EVERY 6 HOURS AS NEEDED FOR  WHEEZING, Disp: 34 g, Rfl: 3   azelastine (ASTELIN) 0.1 % nasal spray, Place into both nostrils 2 (two) times daily. Use in each nostril as directed, Disp: , Rfl:    azithromycin (  ZITHROMAX) 500 MG tablet, Take 1 tablet (500 mg total) by mouth daily. Take 1 tablet ON MONDAY, WEDNESDAYS, FRIDAYS, Disp: 60 tablet, Rfl: 2   Ensifentrine (OHTUVAYRE) 3 MG/2.5ML SUSP, Inhale 2.5 mLs into the lungs 2 (two) times daily., Disp:  450 mL, Rfl: 1   Fluticasone-Umeclidin-Vilant (TRELEGY ELLIPTA) 200-62.5-25 MCG/ACT AEPB, Inhale 1 puff into the lungs daily., Disp: 180 each, Rfl: 5   ipratropium-albuterol (DUONEB) 0.5-2.5 (3) MG/3ML SOLN, Take 3 mLs by nebulization every 4 (four) hours as needed., Disp: 360 mL, Rfl: 3   levothyroxine (SYNTHROID, LEVOTHROID) 112 MCG tablet, Take 112 mcg by mouth daily before breakfast., Disp: , Rfl:    montelukast (SINGULAIR) 10 MG tablet, TAKE 1 TABLET BY MOUTH DAILY, Disp: 90 tablet, Rfl: 3   rosuvastatin (CRESTOR) 10 MG tablet, Take 10 mg by mouth daily., Disp: , Rfl:    vitamin B-12 (CYANOCOBALAMIN) 1000 MCG tablet, Take 1,000 mcg by mouth daily., Disp: , Rfl:    Vitamin D, Cholecalciferol, 10 MCG (400 UNIT) TABS, Take 1 tablet by mouth daily., Disp: , Rfl:     ALLERGIES   Penicillins  BP 102/60 (BP Location: Left Arm, Patient Position: Sitting, Cuff Size: Normal)   Pulse 82   Temp 97.6 F (36.4 C) (Temporal)   Ht 5\' 9"  (1.753 m)   Wt 196 lb 12.8 oz (89.3 kg)   SpO2 96%   BMI 29.06 kg/m      Review of Systems: Gen:  Denies  fever, sweats, chills weight loss  HEENT: Denies blurred vision, double vision, ear pain, eye pain, hearing loss, nose bleeds, sore throat Cardiac:  No dizziness, chest pain or heaviness, chest tightness,edema, No JVD Resp:   No cough, -sputum production, +shortness of breath,-wheezing, -hemoptysis,  Other:  All other systems negative   Physical Examination:   General Appearance: No distress  EYES PERRLA, EOM intact.   NECK Supple, No JVD Pulmonary: normal breath sounds, No wheezing.  CardiovascularNormal S1,S2.  No m/r/g.   Abdomen: Benign, Soft, non-tender. Neurology UE/LE 5/5 strength, no focal deficits Ext pulses intact, cap refill intact ALL OTHER ROS ARE NEGATIVE      CT chest 3.15.19 Emphysematous changes, pulmonary scarring and patchy areas of peribronchial thickening and minimal bronchiectasis. No suspicious nodules,  masses  CT chest 05/2022 CT chest 05/2022      Focal marked peribronchial thickening and mild nodularity in the medial aspect of the right lower lobe is slightly less patchy with a better defined nodule currently measuring 8 mm in maximum diameter, previously 9 mm.   CT chest August 2024 Right lower lobe nodular densities persistent however stable No significant changes         ASSESSMENT/PLAN   68 year old pleasant white male seen today for follow-up assessment for severe end-stage COPD FEV1 24% predicted with Gold stage D associated with chronic respiratory insufficiency underlying severe sleep apnea in the setting of abnormal CT chest right lower lobe nodular opacification  End-stage COPD FEV1 24% predicted No exacerbation at this time Continue Trelegy Patient was started on nebulized therapy Ohtuvayre out of air last office visit Patient advised to rinse mouth after every use Patient states that new nebulizer therapy has helped tremendously however he is in a depressed and angry mood due to loss of his dog Recommend continuing therapy as prescribed    Regarding OSA Severe OSA AHI 58 in 2017 Compliance report reviewed in detail with patient Well-controlled OSA on CPAP Patient use and benefits from therapy Auto 6-18 AHI significantly  reduced Patient Instructions  Continue to use CPAP every night, minimum of 4-6 hours a night.  Change equipment every 30 days or as directed by DME.  Wash your tubing with warm soap and water daily, hang to dry. Wash humidifier portion weekly. Use bottled, distilled water and change daily   Be aware of reduced alertness and do not drive or operate heavy machinery if experiencing this or drowsiness.  Exercise encouraged, as tolerated. Encouraged proper weight management.  Important to get eight or more hours of sleep  Limiting the use of the computer and television before bedtime.  Decrease naps during the day, so night time sleep  will become enhanced.  Limit caffeine, and sleep deprivation.  HTN, stroke, uncontrolled diabetes and heart failure are potential risk factors.  Risk of untreated sleep apnea including cardiac arrhthymias, stroke, DM, pulm HTN.    Regarding respiratory insufficiency Pulmonary rehab referral placed last visit-patient has not followed up Related to COPD and deconditioned state Exercise as tolerated  Obesity -recommend significant weight loss -recommend changing diet Previous weight 195 pounds Last office visit weight 183 pounds Current weight is  Deconditioned state -Recommend increased daily activity and exercise   Abnormal CT chest Images reviewed in detail with the patient today independently Patient was given doxycycline for atypical pneumonia April 2024 compared to February 2024 Notify  serum test was within normal limits Follow-up CT chest lung cancer screening protocol   MEDICATION ADJUSTMENTS/LABS AND TESTS ORDERED: Avoid Allergens and Irritants Avoid secondhand smoke Avoid SICK contacts Recommend  Masking  when appropriate Recommend Keep up-to-date with vaccinations Continue Trelegy and phosphodiesterase nebulized therapy Recommend weight loss Recommend cutting down on alcohol intake Increased exercise capacity     Patient satisfied with Plan of action and management. All questions answered  Follow-up in 3 months  Total time spent 48 minutes  Lucie Leather, M.D.  [facility]  Medical Director [facility] Medical Director [facility] Cardio-Pulmonary Department

## 2023-05-12 ENCOUNTER — Other Ambulatory Visit: Payer: Self-pay | Admitting: Primary Care

## 2023-05-12 ENCOUNTER — Other Ambulatory Visit: Payer: Self-pay | Admitting: Internal Medicine

## 2023-05-12 DIAGNOSIS — J449 Chronic obstructive pulmonary disease, unspecified: Secondary | ICD-10-CM

## 2023-05-12 DIAGNOSIS — J309 Allergic rhinitis, unspecified: Secondary | ICD-10-CM

## 2023-05-18 ENCOUNTER — Telehealth: Payer: Self-pay | Admitting: Internal Medicine

## 2023-05-18 NOTE — Telephone Encounter (Signed)
Dr. Belia Heman you saw the patient on 12/03/22 and placed order for CT chest to be done in 6 months. You saw the patient 05/05/23 and referred to Lung Cancer Screening Program. Should I try to schedule CT for February or wait until LCS Program schedules him

## 2023-05-19 NOTE — Telephone Encounter (Signed)
I have noted Dr. Clovis Fredrickson response on the CT order

## 2023-06-18 ENCOUNTER — Ambulatory Visit (INDEPENDENT_AMBULATORY_CARE_PROVIDER_SITE_OTHER)

## 2023-06-18 ENCOUNTER — Ambulatory Visit
Admission: EM | Admit: 2023-06-18 | Discharge: 2023-06-18 | Disposition: A | Discharge status: Home / Self Care | Attending: Physician Assistant | Admitting: Physician Assistant

## 2023-06-18 DIAGNOSIS — S86111A Strain of other muscle(s) and tendon(s) of posterior muscle group at lower leg level, right leg, initial encounter: Secondary | ICD-10-CM | POA: Diagnosis not present

## 2023-06-18 DIAGNOSIS — M79661 Pain in right lower leg: Secondary | ICD-10-CM

## 2023-06-18 MED ORDER — MELOXICAM 15 MG PO TABS: 15.0000 mg | ORAL_TABLET | Freq: Every day | ORAL | 0 refills | Status: DC
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
## 2023-06-18 NOTE — Discharge Instructions (Signed)
 Your x-ray did not show any evidence of a broken or out of place bone.  I am concerned that you have injured your gastrocnemius muscle.  Use Mobic daily to help with pain and inflammation.  Do not take NSAIDs with this medication including aspirin, ibuprofen/Advil, naproxen/Aleve.  You can do Tylenol/acetaminophen as needed for additional pain relief.  Use the Ace bandage for compression.  Also keep your leg elevated and use heat.  Try to avoid any strenuous activity including lots of walking and whenever you are resting try to keep your leg elevated.  If your symptoms are not improving quickly please follow-up with orthopedics; call to schedule appointment.  If anything worsens and you have increased swelling, redness, additional pain you need to be seen immediately.

## 2023-06-18 NOTE — ED Triage Notes (Signed)
 Pt c/o right lateral lower leg pain starting at 7pm on 06/17/23.  Pt is having trouble walking  Pt states that he has had new activity lately, pt has a new puppy and was trying to stop the dog from using the restroom in the house. Pt states that he jumped toward the dog and had a large cramp that lasted for 15 seconds but has had pain since.  Pt has used a heating pad and it did not help.   Pt denies any bruising, redness, or swelling.

## 2023-06-27 ENCOUNTER — Encounter: Payer: Self-pay | Admitting: Internal Medicine

## 2023-06-27 NOTE — Telephone Encounter (Signed)
 Spoke with pt. See phone note from 05/18/2023.

## 2023-06-27 NOTE — Telephone Encounter (Signed)
 Called and spoke to pt for LCS. Patient states he quit smoking cigarettes over 30 years ago. Per CMS for coverage patients will need to be within 15 years of being quit. Patient aware he does not qualify but Dr. Belia Heman may still want to schedule a diagnostic CT chest. Patient aware to expect a call to schedule Dr. Clovis Fredrickson CT chest. Will forward to [facility] and Dr. Belia Heman as Lorain Childes.

## 2023-06-27 NOTE — Telephone Encounter (Signed)
 I have notified the patient. Nothing further needed.

## 2023-06-28 ENCOUNTER — Ambulatory Visit
Admission: RE | Admit: 2023-06-28 | Discharge: 2023-06-28 | Disposition: A | Discharge status: Home / Self Care | Payer: Medicare Other | Source: Ambulatory Visit | Attending: Gastroenterology

## 2023-06-28 DIAGNOSIS — K7460 Unspecified cirrhosis of liver: Secondary | ICD-10-CM

## 2023-07-07 ENCOUNTER — Other Ambulatory Visit: Payer: Self-pay | Admitting: Gastroenterology

## 2023-07-07 DIAGNOSIS — K7460 Unspecified cirrhosis of liver: Secondary | ICD-10-CM

## 2023-08-03 ENCOUNTER — Ambulatory Visit: Payer: Medicare Other | Admitting: Internal Medicine

## 2023-08-03 ENCOUNTER — Encounter: Payer: Self-pay | Admitting: Internal Medicine

## 2023-08-03 VITALS — BP 100/70 | HR 75 | Temp 98.3°F | Ht 69.0 in | Wt 191.2 lb

## 2023-08-03 DIAGNOSIS — Z87891 Personal history of nicotine dependence: Secondary | ICD-10-CM

## 2023-08-03 DIAGNOSIS — G4733 Obstructive sleep apnea (adult) (pediatric): Secondary | ICD-10-CM | POA: Diagnosis not present

## 2023-08-03 DIAGNOSIS — R9389 Abnormal findings on diagnostic imaging of other specified body structures: Secondary | ICD-10-CM | POA: Diagnosis not present

## 2023-08-03 DIAGNOSIS — R0689 Other abnormalities of breathing: Secondary | ICD-10-CM | POA: Diagnosis not present

## 2023-08-03 DIAGNOSIS — J449 Chronic obstructive pulmonary disease, unspecified: Secondary | ICD-10-CM | POA: Diagnosis not present

## 2023-08-03 DIAGNOSIS — E669 Obesity, unspecified: Secondary | ICD-10-CM

## 2023-08-03 DIAGNOSIS — R5381 Other malaise: Secondary | ICD-10-CM

## 2023-08-03 MED ORDER — OHTUVAYRE 3 MG/2.5ML IN SUSP: 2.5000 mL | Freq: Two times a day (BID) | RESPIRATORY_TRACT | 10 refills | Status: DC

## 2023-08-03 NOTE — Patient Instructions (Signed)
 Continue Trelegy and phosphodiesterase nebulized therapy Please rinse mouth after every use Avoid Allergens and Irritants Avoid secondhand smoke Avoid SICK contacts Recommend  Masking  when appropriate Recommend Keep up-to-date with vaccinations Recommend weight loss Recommend cutting down on alcohol intake CT chest to assess Lungs  Excellent Job A+ GOLD STAR!!  Continue CPAP as prescribed  Patient Instructions Continue to use CPAP every night, minimum of 4-6 hours a night.  Change equipment every 30 days or as directed by DME.  Wash your tubing with warm soap and water daily, hang to dry. Wash humidifier portion weekly. Use bottled, distilled water and change daily   Be aware of reduced alertness and do not drive or operate heavy machinery if experiencing this or drowsiness.  Exercise encouraged, as tolerated. Encouraged proper weight management.  Important to get eight or more hours of sleep  Limiting the use of the computer and television before bedtime.  Decrease naps during the day, so night time sleep will become enhanced.  Limit caffeine, and sleep deprivation.    Avoid Allergens and Irritants Avoid secondhand smoke Avoid SICK contacts Recommend  Masking  when appropriate Recommend Keep up-to-date with vaccinations

## 2023-08-03 NOTE — Progress Notes (Signed)
 [facility] [place] Pulmonary Medicine Consultation      Date: 08/03/2023,   MRN# [id number] David [name] Jul 08, 1955  David [name] is a 68 y.o. old male seen in consultation for COPD   Synopsis estabished care in 2017 for Severe COPD PFT's 10/2015 Fev1 28% 1.03L  WNL 95% o2 sat at 1083 feet Bode index calculated 57% 4-year survival rate   CT chest 3.15.19 Reviewed with patient +emphysema No suspicious nodules, massess No effusions No pneumonia  CT chest 05/2022 Right upper lobe nodular densities Patient given antibiotics  May 2024 for download Excellent compliance report 100% AHI reduced to 0.06 April 2023 compliance download AHI reduced to 0.1 Excellent compliance report Auto CPAP 7 to 18 cm water pressure  CHIEF COMPLAINT:    Follow-up assessment for severe COPD Follow-up assessment for OSA Follow-up assessment for abnormal CT chest    HISTORY OF PRESENT ILLNESS   Re: COPD  No exacerbation at this time No evidence of heart failure at this time No evidence or signs of infection at this time No respiratory distress No fevers, chills, nausea, vomiting, diarrhea No evidence of lower extremity edema No evidence hemoptysis  COPD seems to be stable at this time  His shortness of breath and dyspnea exertion has progressively worsened since July 2022 when he was diagnosed with COVID-19 infection Exercise tolerance and capacity has diminished over time tried chronic azithromycin  therapy in the past patient did not tolerate GI symptoms Recommend continuing phosphodiesterase inhibitor nebulized therapy Prescribed OHTUVAYRE  Currently Trelegy seems to be helping Patient would like to continue Trelegy as prescribed Last office visit we started nebulized phosphodiesterase inhibitor therapy At this time the nebulized therapy has helped we will plan to continue as tolerated   PFTs reviewed Severe obstructive airways disease FEV1 24% predicted No significant  bronchodilator response Decreased DLCO Lung volumes consistent with air trapping    Assessment of OSA Patient uses and benefits from therapy Using CPAP nightly and with naps Pressure setting is comfortable and is sleeping well.  Excellent compliance report Previous sleep study in 2017 showed AHI of a 58 Auto CPAP 6-18 100% compliance for greater than 4 hours and usage Significant reduction of AHI to 0.1 Patient uses and benefits from therapy Using CPAP nightly and with naps Pressure setting is comfortable and is sleeping well.   Regarding weight Previous weight 195 pounds Last office visit 183 pounds Current weight 196 pounds Patient attributes this to loss of dog   Regarding abnormal CT chest I independently reviewed CT chest  April 2024 CT chest with February 2024 CT chest Right lower lobe nodule opacities seem to be more solidified and current CT scan Notify lung serum blood test were within normal limits Patient is at high risk for malignancy Follow-up CT chest August 2024 shows residual right lower lobe opacity No significant change over the last 6 months Will refer to patient to lung cancer screening program-patient was not a candidate We will follow-up CT chest to assess for malignancy    Current Medication:  Current Outpatient Medications:    albuterol  (VENTOLIN  HFA) 108 (90 Base) MCG/ACT inhaler, USE 2 INHALATIONS BY MOUTH EVERY 6 HOURS AS NEEDED FOR WHEEZING, Disp: 34 g, Rfl: 3   Ensifentrine  (OHTUVAYRE ) 3 MG/2.5ML SUSP, Inhale 2.5 mLs into the lungs 2 (two) times daily., Disp: 450 mL, Rfl: 1   Fluticasone -Umeclidin-Vilant (TRELEGY ELLIPTA ) 200-62.5-25 MCG/ACT AEPB, Inhale 1 puff into the lungs daily., Disp: 180 each, Rfl: 5   ipratropium-albuterol  (  DUONEB) 0.5-2.5 (3) MG/3ML SOLN, Take 3 mLs by nebulization every 4 (four) hours as needed., Disp: 360 mL, Rfl: 3   levothyroxine  (SYNTHROID , LEVOTHROID) 112 MCG tablet, Take 112 mcg by mouth daily before  breakfast., Disp: , Rfl:    meloxicam  (MOBIC ) 15 MG tablet, Take 1 tablet (15 mg total) by mouth daily., Disp: 14 tablet, Rfl: 0   montelukast  (SINGULAIR ) 10 MG tablet, TAKE 1 TABLET BY MOUTH DAILY, Disp: 90 tablet, Rfl: 3   rosuvastatin  (CRESTOR ) 10 MG tablet, Take 10 mg by mouth daily., Disp: , Rfl:    vitamin B-12 (CYANOCOBALAMIN) 1000 MCG tablet, Take 1,000 mcg by mouth daily., Disp: , Rfl:    Vitamin D, Cholecalciferol, 10 MCG (400 UNIT) TABS, Take 1 tablet by mouth daily., Disp: , Rfl:     ALLERGIES   Penicillins  BP 100/70 (BP Location: Right Arm, Patient Position: Sitting, Cuff Size: Normal)   Pulse 75   Temp 98.3 F (36.8 C) (Oral)   Ht 5\' 9"  (1.753 m)   Wt 191 lb 3.2 oz (86.7 kg)   SpO2 96%   BMI 28.24 kg/m     Review of Systems: Gen:  Denies  fever, sweats, chills weight loss  HEENT: Denies blurred vision, double vision, ear pain, eye pain, hearing loss, nose bleeds, sore throat Cardiac:  No dizziness, chest pain or heaviness, chest tightness,edema, No JVD Resp:   No cough, -sputum production, -shortness of breath,-wheezing, -hemoptysis,  Other:  All other systems negative   Physical Examination:   General Appearance: No distress  EYES PERRLA, EOM intact.   NECK Supple, No JVD Pulmonary: normal breath sounds, No wheezing.  CardiovascularNormal S1,S2.  No m/r/g.   Abdomen: Benign, Soft, non-tender. Neurology UE/LE 5/5 strength, no focal deficits Ext pulses intact, cap refill intact ALL OTHER ROS ARE NEGATIVE       CT chest 3.15.19 Emphysematous changes, pulmonary scarring and patchy areas of peribronchial thickening and minimal bronchiectasis. No suspicious nodules, masses  CT chest 05/2022 CT chest 05/2022      Focal marked peribronchial thickening and mild nodularity in the medial aspect of the right lower lobe is slightly less patchy with a better defined nodule currently measuring 8 mm in maximum diameter, previously 9 mm.   CT chest  August 2024 Right lower lobe nodular densities persistent however stable No significant changes         ASSESSMENT/PLAN   68 year old pleasant white male seen today for follow-up assessment for severe end-stage COPD FEV1 24% predicted with Gold stage D associated with chronic respiratory insufficiency underlying severe sleep apnea in the setting of abnormal CT chest right lower lobe nodular opacification  Assessment of COPD End-stage FEV1 24% predicted Continue Trelegy Continue phosphodiesterase nebulized therapy Recommend continuing therapy as prescribed No exacerbation at this time No evidence of heart failure at this time No evidence or signs of infection at this time No respiratory distress No fevers, chills, nausea, vomiting, diarrhea No evidence of lower extremity edema No evidence hemoptysis    Assessment of OSA Severe OSA AHI 58 in 2017 Continue CPAP as prescribed  Excellent compliance report Reviewed compliance report in detail with patient Patient definitely benefits the use of CPAP therapy as prescribed Using CPAP nightly and with naps Pressure setting is comfortable and is sleeping well. CPAP prescription Auto 6-18 AHI reduced to  No evidence of acute heart failure at this time No respiratory distress No fevers, chills, nausea, vomiting, diarrhea No evidence hemoptysis  Patient Instructions Continue to  use CPAP every night, minimum of 4-6 hours a night.  Change equipment every 30 days or as directed by DME.  Wash your tubing with warm soap and water daily, hang to dry. Wash humidifier portion weekly. Use bottled, distilled water and change daily   Be aware of reduced alertness and do not drive or operate heavy machinery if experiencing this or drowsiness.  Exercise encouraged, as tolerated. Encouraged proper weight management.  Important to get eight or more hours of sleep  Limiting the use of the computer and television before bedtime.  Decrease  naps during the day, so night time sleep will become enhanced.  Limit caffeine, and sleep deprivation.  HTN, stroke, uncontrolled diabetes and heart failure are potential risk factors.  Risk of untreated sleep apnea including cardiac arrhthymias, stroke, DM, pulm HTN.     Regarding respiratory insufficiency Pulmonary rehab referral placed last visit-patient has not followed up Related to COPD and deconditioned state Exercise as tolerated  Obesity -recommend significant weight loss -recommend changing diet Previous weight 195 pounds Last office visit weight 183 pounds Current weight 190  Deconditioned state -Recommend increased daily activity and exercise   Abnormal CT chest Images reviewed in detail with the patient today independently Patient was given doxycycline  for atypical pneumonia April 2024 compared to February 2024 Notify  serum test was within normal limits Patient not a candidate for lung cancer screening protocol Patient with a history of abnormal CT chest We will repeat CT chest   MEDICATION ADJUSTMENTS/LABS AND TESTS ORDERED: Continue Trelegy and phosphodiesterase nebulized therapy Avoid Allergens and Irritants Avoid secondhand smoke Avoid SICK contacts Recommend  Masking  when appropriate Recommend Keep up-to-date with vaccinations Recommend weight loss Recommend cutting down on alcohol intake Increased exercise capacity CT chest to assess Lungs     Patient satisfied with Plan of action and management. All questions answered  Follow-up in 6 months  Total time spent 42 minutes  Lady Pier, M.D.  [facility] Pulmonary & Critical Care Medicine  Medical Director [facility] Medical Director [facility] Cardio-Pulmonary Department

## 2023-08-04 MED ORDER — OHTUVAYRE 3 MG/2.5ML IN SUSP: 2.5000 mL | Freq: Two times a day (BID) | RESPIRATORY_TRACT | 10 refills | Status: DC

## 2023-08-04 NOTE — Addendum Note (Signed)
 Addended by: Thais Fill on: 08/04/2023 08:16 AM   Modules accepted: Orders

## 2023-08-11 ENCOUNTER — Other Ambulatory Visit: Payer: Self-pay

## 2023-08-11 DIAGNOSIS — J449 Chronic obstructive pulmonary disease, unspecified: Secondary | ICD-10-CM

## 2023-08-11 DIAGNOSIS — R0689 Other abnormalities of breathing: Secondary | ICD-10-CM

## 2023-08-11 MED ORDER — OHTUVAYRE 3 MG/2.5ML IN SUSP: 2.5000 mL | Freq: Two times a day (BID) | RESPIRATORY_TRACT | 10 refills | Status: DC

## 2023-08-11 NOTE — Telephone Encounter (Signed)
 Rx has been refilled through [facility] pharmacy per Dr. Auston Left

## 2023-08-26 ENCOUNTER — Ambulatory Visit
Admission: RE | Admit: 2023-08-26 | Discharge: 2023-08-26 | Disposition: A | Discharge status: Home / Self Care | Source: Ambulatory Visit | Attending: Internal Medicine | Admitting: Internal Medicine

## 2023-08-26 ENCOUNTER — Ambulatory Visit

## 2023-08-26 DIAGNOSIS — R9389 Abnormal findings on diagnostic imaging of other specified body structures: Secondary | ICD-10-CM | POA: Insufficient documentation

## 2023-09-26 ENCOUNTER — Other Ambulatory Visit: Payer: Self-pay | Admitting: Gastroenterology

## 2023-09-26 DIAGNOSIS — K7460 Unspecified cirrhosis of liver: Secondary | ICD-10-CM

## 2023-10-14 ENCOUNTER — Other Ambulatory Visit: Payer: Self-pay | Admitting: Internal Medicine

## 2023-12-06 ENCOUNTER — Inpatient Hospital Stay: Admission: RE | Admit: 2023-12-06 | Source: Ambulatory Visit

## 2023-12-13 ENCOUNTER — Ambulatory Visit
Admission: RE | Admit: 2023-12-13 | Discharge: 2023-12-13 | Disposition: A | Discharge status: Home / Self Care | Source: Ambulatory Visit | Attending: Gastroenterology | Admitting: Gastroenterology

## 2023-12-13 DIAGNOSIS — K7460 Unspecified cirrhosis of liver: Secondary | ICD-10-CM

## 2023-12-13 MED ORDER — IOPAMIDOL (ISOVUE-300) INJECTION 61%
100.0000 mL | Freq: Once | INTRAVENOUS | Status: AC | PRN
  Administered 2023-12-13: 100 mL via INTRAVENOUS

## 2023-12-14 ENCOUNTER — Ambulatory Visit: Admission: RE | Admit: 2023-12-14 | Source: Ambulatory Visit

## 2024-02-10 ENCOUNTER — Encounter: Payer: Self-pay | Admitting: Internal Medicine

## 2024-02-10 ENCOUNTER — Ambulatory Visit (INDEPENDENT_AMBULATORY_CARE_PROVIDER_SITE_OTHER): Admitting: Internal Medicine

## 2024-02-10 ENCOUNTER — Other Ambulatory Visit (HOSPITAL_COMMUNITY): Payer: Self-pay

## 2024-02-10 ENCOUNTER — Telehealth: Payer: Self-pay

## 2024-02-10 VITALS — BP 130/80 | HR 83 | Temp 98.3°F | Ht 69.0 in | Wt 192.8 lb

## 2024-02-10 DIAGNOSIS — J449 Chronic obstructive pulmonary disease, unspecified: Secondary | ICD-10-CM

## 2024-02-10 DIAGNOSIS — G4733 Obstructive sleep apnea (adult) (pediatric): Secondary | ICD-10-CM | POA: Diagnosis not present

## 2024-02-10 DIAGNOSIS — R918 Other nonspecific abnormal finding of lung field: Secondary | ICD-10-CM

## 2024-02-10 MED ORDER — ROFLUMILAST 500 MCG PO TABS: 500.0000 ug | ORAL_TABLET | Freq: Every day | ORAL | 3 refills | Status: DC

## 2024-02-10 NOTE — Progress Notes (Signed)
 [facility] [place] Pulmonary Medicine Consultation      Date: 02/10/2024,   MRN# [id number] David [name] 1956/02/07  David [name] is a 68 y.o. old male seen in consultation for COPD   Synopsis estabished care in 2017 for Severe COPD PFT's 10/2015 Fev1 28% 1.03L  WNL 95% o2 sat at 1083 feet Bode index calculated 57% 4-year survival rate  Dx of OSA Previous sleep study in 2017 showed AHI of a 58 Auto CPAP 6-18  PFTs  Severe obstructive airways disease FEV1 24% predicted No significant bronchodilator response Decreased DLCO Lung volumes consistent with air trapping   CT chest 3.15.19 Reviewed with patient +emphysema No suspicious nodules, massess No effusions No pneumonia  CT chest 05/2022 Right upper lobe nodular densities Patient given antibiotics  May 2024 for download Excellent compliance report 100% AHI reduced to 0.06 April 2023 compliance download AHI reduced to 0.1 Excellent compliance report Auto CPAP 7 to 18 cm water pressure  CHIEF COMPLAINT:    Follow up assessment severe COPD Follow up assessment of OSA Follow-up assessment for abnormal CT chest    HISTORY OF PRESENT ILLNESS   Re: COPD  No exacerbation at this time No evidence of heart failure at this time No evidence or signs of infection at this time No respiratory distress No fevers, chills, nausea, vomiting, diarrhea No evidence of lower extremity edema No evidence hemoptysis   COPD seems to be stable at this time  His shortness of breath and dyspnea exertion has progressively worsened since July 2022 when he was diagnosed with COVID-19 infection Exercise tolerance and capacity has diminished over time tried chronic azithromycin  therapy in the past patient did not tolerate-GI symptoms Patient would like to continue Trelegy as prescribed Last office visit we started nebulized phosphodiesterase inhibitor therapy At this time the nebulized therapy (OHTUVAYRE )has not really  helped his symptoms   We discussed taking oral medication to help with shortness of breath and dyspnea on exertion -discussed option of Daliresp   Discussed sleep data and reviewed with patient.  Encouraged proper weight management.  Discussed driving precautions and its relationship with hypersomnolence.  Discussed sleep hygiene, and benefits of a fixed sleep waked time.  The importance of getting eight or more hours of sleep discussed with patient.  Discussed limiting the use of the computer and television before bedtime.  Decrease naps during the day, so night time sleep will become enhanced.  Limit caffeine, and sleep deprivation.   Patient uses and benefits from therapy Using CPAP nightly and with naps Pressure setting is comfortable and is sleeping well. 100% compliant AHI reduced to 0.1  Regarding weight Previous weight 195 pounds Current weight 192    Regarding abnormal CT chest I independently reviewed CT chest  April 2024 CT chest with February 2024 CT chest Right lower lobe nodule opacities seem to be more solidified and current CT scan Notify lung serum blood test were within normal limits Patient is at high risk for malignancy Follow-up CT chest August 2024 shows residual right lower lobe opacity No significant change over the last 6 months Will refer to patient to lung cancer screening program-patient was not a candidate We will follow-up CT chest to assess for malignancy I recommend repeat CT chest     Current Medication:  Current Outpatient Medications:    albuterol  (VENTOLIN  HFA) 108 (90 Base) MCG/ACT inhaler, USE 2 INHALATIONS BY MOUTH EVERY 6 HOURS AS NEEDED FOR WHEEZING, Disp: 34 g, Rfl: 3  Ensifentrine  (OHTUVAYRE ) 3 MG/2.5ML SUSP, Inhale 2.5 mLs into the lungs 2 (two) times daily., Disp: 450 mL, Rfl: 10   ipratropium-albuterol  (DUONEB) 0.5-2.5 (3) MG/3ML SOLN, Take 3 mLs by nebulization every 4 (four) hours as needed., Disp: 360 mL, Rfl: 3    levothyroxine  (SYNTHROID , LEVOTHROID) 112 MCG tablet, Take 112 mcg by mouth daily before breakfast., Disp: , Rfl:    meloxicam  (MOBIC ) 15 MG tablet, Take 1 tablet (15 mg total) by mouth daily. (Patient not taking: Reported on 08/03/2023), Disp: 14 tablet, Rfl: 0   montelukast  (SINGULAIR ) 10 MG tablet, TAKE 1 TABLET BY MOUTH DAILY, Disp: 90 tablet, Rfl: 3   rosuvastatin  (CRESTOR ) 10 MG tablet, Take 10 mg by mouth daily., Disp: , Rfl:    TRELEGY ELLIPTA  200-62.5-25 MCG/ACT AEPB, USE 1 INHALATION BY MOUTH DAILY, Disp: 180 each, Rfl: 3   vitamin B-12 (CYANOCOBALAMIN) 1000 MCG tablet, Take 1,000 mcg by mouth daily., Disp: , Rfl:    Vitamin D, Cholecalciferol, 10 MCG (400 UNIT) TABS, Take 1 tablet by mouth daily., Disp: , Rfl:     ALLERGIES   Penicillins  BP 130/80   Pulse 83   Temp 98.3 F (36.8 C)   Ht 5' 9 (1.753 m)   Wt 192 lb 12.8 oz (87.5 kg)   SpO2 93%   BMI 28.47 kg/m     Physical Examination:  General Appearance: No distress  EYES EOM intact.   NECK Supple, No JVD Pulmonary: normal breath sounds, No wheezing.  CardiovascularNormal S1,S2.  No m/r/g.   Ext pulses intact, cap refill intact  ALL OTHER ROS ARE NEGATIVE       CT chest 3.15.19 Emphysematous changes, pulmonary scarring and patchy areas of peribronchial thickening and minimal bronchiectasis. No suspicious nodules, masses  CT chest 05/2022      Focal marked peribronchial thickening and mild nodularity in the medial aspect of the right lower lobe is slightly less patchy with a better defined nodule currently measuring 8 mm in maximum diameter, previously 9 mm.   CT chest August 2024 Right lower lobe nodular densities persistent however stable No significant changes         ASSESSMENT/PLAN    68 year old pleasant white male seen today for follow-up assessment for severe end-stage COPD FEV1 24% predicted with Gold stage D associated with chronic respiratory insufficiency underlying severe  sleep apnea(AHI 58) in the setting of abnormal CT chest right lower lobe nodular opacification    Assessment of COPD End-stage FEV1 24% predicted Continue Trelegy Stop phosphodiesterase nebulized therapy Start Daliresp-side effect profile reviewed with patient  No exacerbation at this time Avoid Allergens and Irritants Avoid secondhand smoke Avoid SICK contacts Recommend  Masking  when appropriate Recommend Keep up-to-date with vaccinations Obtain 6-minute walk to assess for hypoxia Recommend pulmonary rehab referral    Assessment of OSA Previous AHI 58 Continue CPAP as prescribed  Excellent compliance report Reviewed compliance report in detail with patient Patient definitely benefits the use of CPAP therapy as prescribed Using CPAP nightly and with naps Pressure setting is comfortable and is sleeping well. CPAP prescription 7-18 AHI reduced to 0.1  No evidence of acute heart failure at this time No respiratory distress No fevers, chills, nausea, vomiting, diarrhea No evidence hemoptysis  Patient Instructions Continue to use CPAP every night, minimum of 4-6 hours a night.  Change equipment every 30 days or as directed by DME.  Wash your tubing with warm soap and water daily, hang to dry.  Wash humidifier portion weekly.  Use bottled, distilled water and change daily  Risk of untreated sleep apnea including cardiac arrhthymias, stroke, DM, pulm HTN.     Regarding respiratory insufficiency Pulmonary rehab referral placed  Related to COPD and deconditioned state Exercise as tolerated  Obesity -recommend significant weight loss -recommend changing diet Previous weight 195  Deconditioned state -Recommend increased daily activity and exercise   Abnormal CT chest April 2024 compared to February 2024 Notify  serum test was within normal limits Patient not a candidate for lung cancer screening protocol Patient with a history of abnormal CT chest Recommend  repeat CT chest to assess for lung cancer, previous history of lung nodule    MEDICATION ADJUSTMENTS/LABS AND TESTS ORDERED: Continue Trelegy Stop phosphodiesterase nebulized therapy Start Daliresp Obtain CT chest Obtain 6-minute walk test Pulmonary rehab referral Avoid Allergens and Irritants Avoid secondhand smoke Avoid SICK contacts Recommend  Masking  when appropriate Recommend Keep up-to-date with vaccinations   CURRENT MEDICATIONS REVIEWED AT LENGTH WITH PATIENT TODAY   Patient  satisfied with Plan of action and management. All questions answered   Follow up 6 months   I spent a total of 53 minutes dedicated to the care of this patient on the date of this encounter to include pre-visit review of records, face-to-face time with the patient discussing conditions above, post visit ordering of testing, clinical documentation with the electronic health record, making appropriate referrals as documented, and communicating necessary information to the patient's healthcare team.    The Patient requires high complexity decision making for assessment and support, frequent evaluation and titration of therapies, application of advanced monitoring technologies and extensive interpretation of multiple databases.  Patient satisfied with Plan of action and management. All questions answered    Nickolas Alm Cellar, M.D.  [facility] Pulmonary & Critical Care Medicine  Medical Director [facility] [place]

## 2024-02-10 NOTE — Telephone Encounter (Signed)
 Your request has been approved Request Reference Number: [id number]. ROFLUMILAST TAB 500MCG is approved through 02/09/2025. Your patient may now fill this prescription and it will be covered. Authorization Expiration11/10/2024

## 2024-02-10 NOTE — Patient Instructions (Signed)
 Continue Trelegy as prescribed Rinse mouth after use Lets plan to stop the nebulized therapy Start Daliresp 500 mg daily to help with breathing Plan for rehab referral PULM REHAB Obtain CT Chest to assess LUNGS Obtain 6 minute walk test to assess for oxygen levels   Excellent Job A+ GOLD STAR!!  Continue CPAP as prescribed  Patient Instructions Continue to use CPAP every night, minimum of 4-6 hours a night.  Change equipment every 30 days or as directed by DME.  Wash your tubing with warm soap and water daily, hang to dry. Wash humidifier portion weekly. Use bottled, distilled water and change daily   Be aware of reduced alertness and do not drive or operate heavy machinery if experiencing this or drowsiness.  Exercise encouraged, as tolerated. Encouraged proper weight management.  Important to get eight or more hours of sleep  Limiting the use of the computer and television before bedtime.  Decrease naps during the day, so night time sleep will become enhanced.  Limit caffeine, and sleep deprivation.    Avoid Allergens and Irritants Avoid secondhand smoke Avoid SICK contacts Recommend  Masking  when appropriate Recommend Keep up-to-date with vaccinations

## 2024-02-10 NOTE — Telephone Encounter (Signed)
*  Pulm  Pharmacy Patient Advocate Encounter   Received notification from CoverMyMeds that prior authorization for Roflumilast 500MCG tablets  is required/requested.   Insurance verification completed.   The patient is insured through [facility].   Per test claim: PA required; PA submitted to above mentioned insurance via Latent Key/confirmation #/EOC [id number] Status is pending

## 2024-02-22 ENCOUNTER — Ambulatory Visit
Admission: RE | Admit: 2024-02-22 | Discharge: 2024-02-22 | Disposition: A | Discharge status: Home / Self Care | Source: Ambulatory Visit | Attending: Internal Medicine | Admitting: Internal Medicine

## 2024-02-22 DIAGNOSIS — R918 Other nonspecific abnormal finding of lung field: Secondary | ICD-10-CM | POA: Diagnosis present

## 2024-02-28 ENCOUNTER — Ambulatory Visit: Payer: Self-pay | Admitting: Internal Medicine

## 2024-03-05 ENCOUNTER — Telehealth: Payer: Self-pay

## 2024-03-05 ENCOUNTER — Ambulatory Visit

## 2024-03-05 NOTE — Progress Notes (Signed)
 Patient was seen in the office for a 6 min walk.

## 2024-03-05 NOTE — Telephone Encounter (Signed)
 Patient was in the office today for a walk. He said the Daliresp  is causing him to have GI symptoms. He would like to stop it but needs to know if he has to taper off of it. He also said he feels like the Ohtuvayre  was helping and would like to go back on it. Lastly, he did mention that he has started to cough a lot. He did ask for a sooner appointment then Feb to discuss it with you. He is now scheduled to see you on 03/15/2024.  Please advise.

## 2024-03-05 NOTE — Telephone Encounter (Signed)
 I have notified the patient. Nothing further needed.

## 2024-03-09 ENCOUNTER — Encounter (INDEPENDENT_AMBULATORY_CARE_PROVIDER_SITE_OTHER): Payer: Self-pay

## 2024-03-15 ENCOUNTER — Encounter: Payer: Self-pay | Admitting: Internal Medicine

## 2024-03-15 ENCOUNTER — Ambulatory Visit: Admitting: Internal Medicine

## 2024-03-15 DIAGNOSIS — J449 Chronic obstructive pulmonary disease, unspecified: Secondary | ICD-10-CM | POA: Diagnosis not present

## 2024-03-15 DIAGNOSIS — R0689 Other abnormalities of breathing: Secondary | ICD-10-CM | POA: Diagnosis not present

## 2024-03-15 DIAGNOSIS — G4733 Obstructive sleep apnea (adult) (pediatric): Secondary | ICD-10-CM | POA: Diagnosis not present

## 2024-03-15 MED ORDER — PREDNISONE 10 MG PO TABS: 10.0000 mg | ORAL_TABLET | Freq: Every day | ORAL | 0 refills | Status: AC

## 2024-03-15 NOTE — Patient Instructions (Signed)
 We will make referral for biologic agent called Nucala Continue Trelegy as prescribed Rinse mouth after use Continue CPAP as prescribed Continue nebulizers as prescribed for your breathing  We will make a referral to cardiology to assess for exercise-induced pulmonary hypertension  Avoid Allergens and Irritants Avoid secondhand smoke Avoid SICK contacts Recommend  Masking  when appropriate Recommend Keep up-to-date with vaccinations

## 2024-03-15 NOTE — Progress Notes (Signed)
 [facility] Pulmonary Medicine Consultation      Date: 03/15/2024,   MRN# [id number] David [name] 1955-11-22  David [name] is a 68 y.o. old male seen in consultation for COPD   Synopsis estabished care in 2017 for Severe COPD PFT's 10/2015 Fev1 28% 1.03L  WNL 95% o2 sat at 1083 feet Bode index calculated 57% 4-year survival rate  Dx of OSA Previous sleep study in 2017 showed AHI of a 58 Auto CPAP 6-18  PFTs  Severe obstructive airways disease FEV1 24% predicted No significant bronchodilator response Decreased DLCO Lung volumes consistent with air trapping   CT chest 3.15.19 Reviewed with patient +emphysema No suspicious nodules, massess No effusions No pneumonia  CT chest 05/2022 Right upper lobe nodular densities Patient given antibiotics  May 2024 for download Excellent compliance report 100% AHI reduced to 0.06 April 2023 compliance download AHI reduced to 0.1 Excellent compliance report Auto CPAP 7 to 18 cm water pressure  CHIEF COMPLAINT:    Follow-up assessment severe COPD Follow-up assessment for OSA Follow-up assessment for abnormal CT chest    HISTORY OF PRESENT ILLNESS   Re: COPD  No exacerbation at this time No evidence of heart failure at this time No evidence or signs of infection at this time No respiratory distress No fevers, chills, nausea, vomiting, diarrhea No evidence of lower extremity edema No evidence hemoptysis   His shortness of breath and dyspnea exertion has progressively worsened since July 2022 when he was diagnosed with COVID-19 infection Exercise tolerance and capacity has diminished over time Recommend referral to cardiology to assess for exercise-induced pulmonary hypertension May need right heart cath  Medications patient failed and tried Chronic azithromycin  therapy-GI symptoms Nebulized phosphodiesterase inhibitor no improvement Daliresp -GI symptoms We did resume nebulized phosphodiesterase  inhibitor   CPAP download unavailable at this time  Regarding weight Previous weight 195 pounds Current weight 192   Regarding abnormal CT chest I independently reviewed CT chest  April 2024 CT chest with February 2024 CT chest NODIFY lung serum blood test were within normal limits Patient is at high risk for malignancy Follow-up CT chest August 2024 shows residual right lower lobe opacity No significant change over the last 6 months Will refer to patient to lung cancer screening program-patient was not a candidate  CT chest Stable 7.5 mm right middle lobe pulmonary nodule without interval change; per Fleischner Society guidelines for a single solid 68 mm nodule in a high-risk patient, recommend non-contrast chest CT at 612 months from the prior, then repeat at 1824 months if unchanged. 2. Diffuse emphysematous changes in the lungs; given emphysema as an independent lung cancer risk factor, consider evaluation for a low-dose CT lung cancer screening program. 3. Bronchial wall thickening with fine peribronchial nodules or infiltrates, likely airways disease or respiratory bronchiolitis. 4. Scarring in the lung apices.  Current Medication:  Current Outpatient Medications:    rosuvastatin  (CRESTOR ) 20 MG tablet, Take 20 mg by mouth at bedtime., Disp: , Rfl:    albuterol  (VENTOLIN  HFA) 108 (90 Base) MCG/ACT inhaler, USE 2 INHALATIONS BY MOUTH EVERY 6 HOURS AS NEEDED FOR WHEEZING, Disp: 34 g, Rfl: 3   furosemide (LASIX) 40 MG tablet, Take 40 mg by mouth daily., Disp: , Rfl:    levothyroxine  (SYNTHROID , LEVOTHROID) 112 MCG tablet, Take 112 mcg by mouth daily before breakfast., Disp: , Rfl:    montelukast  (SINGULAIR ) 10 MG tablet, TAKE 1 TABLET BY MOUTH DAILY, Disp: 90 tablet, Rfl: 3  roflumilast  (DALIRESP ) 500 MCG TABS tablet, Take 1 tablet (500 mcg total) by mouth daily., Disp: 30 tablet, Rfl: 3   spironolactone (ALDACTONE) 100 MG tablet, Take 100 mg by mouth daily., Disp: ,  Rfl:    TRELEGY ELLIPTA  200-62.5-25 MCG/ACT AEPB, USE 1 INHALATION BY MOUTH DAILY, Disp: 180 each, Rfl: 3   vitamin B-12 (CYANOCOBALAMIN) 1000 MCG tablet, Take 1,000 mcg by mouth daily., Disp: , Rfl:    Vitamin D, Cholecalciferol, 10 MCG (400 UNIT) TABS, Take 1 tablet by mouth daily., Disp: , Rfl:    XDEMVY 0.25 % SOLN, Apply 1 drop to eye 2 (two) times daily., Disp: , Rfl:     ALLERGIES   Penicillins  BP 120/80   Pulse 77   Ht 5' 9 (1.753 m)   Wt 198 lb (89.8 kg)   SpO2 95%   BMI 29.24 kg/m     Physical Examination:  General Appearance: No distress  EYES EOM intact.   NECK Supple, No JVD Pulmonary: normal breath sounds, No wheezing.  CardiovascularNormal S1,S2.  No m/r/g.   Ext pulses intact, cap refill intact  ALL OTHER ROS ARE NEGATIVE    CT chest 3.15.19 Emphysematous changes, pulmonary scarring and patchy areas of peribronchial thickening and minimal bronchiectasis. No suspicious nodules, masses  CT chest 05/2022 Focal marked peribronchial thickening and mild nodularity in the medial aspect of the right lower lobe is slightly less patchy with a better defined nodule currently measuring 8 mm in maximum diameter, previously 9 mm.   CT chest August 2024 Right lower lobe nodular densities persistent however stable No significant changes     ASSESSMENT/PLAN    68 year old pleasant white male seen today for follow-up assessment for severe end-stage COPD FEV1 24% predicted with Gold stage D associated with chronic respiratory insufficiency underlying severe sleep apnea(AHI 58)    Assessment of COPD End-stage FEV1 24% predicted Continue Trelegy Continue phosphodiesterase nebulized therapy Stop Daliresp  Assess for Nucala injection biologic agents Information provided  Progressive shortness of breath Dyspnea on exertion Recommend referral to cardiology to assess for exercise-induced pulm hypertension May need right heart cath   Assessment of  OSA Previous AHI 58 Continue CPAP as prescribed  Patient Instructions Continue to use CPAP every night, minimum of 4-6 hours a night.  Change equipment every 30 days or as directed by DME.  Wash your tubing with warm soap and water daily, hang to dry.  Wash humidifier portion weekly. Use bottled, distilled water and change daily  Risk of untreated sleep apnea including cardiac arrhthymias, stroke, DM, pulm HTN.    Regarding respiratory insufficiency Related to COPD and deconditioned state Exercise as tolerated     Abnormal CT chest April 2024 compared to February 2024 Notify  serum test was within normal limits Patient not a candidate for lung cancer screening protocol Patient with a history of abnormal CT chest REPEAT  CT chest to assess for lung cancer, previous history of lung nodule November 2025  scarring in the lung apices. Diffuse emphysematous changes in the lungs. Bronchial wall thickening with fine peribronchial nodules or infiltrates likely representing infection, airways disease or respiratory bronchiolitis. No focal consolidation. No pleural effusion or pneumothorax. Focal nodularity seen previously in the right middle lobe medially is again demonstrated, today measuring 7.5 mm, unchanged.   MEDICATION ADJUSTMENTS/LABS AND TESTS ORDERED: Continue Trelegy Restart phosphodiesterase nebulized therapy Stop Daliresp  Cardiology referral     CURRENT MEDICATIONS REVIEWED AT LENGTH WITH PATIENT TODAY   Patient  satisfied with Plan  of action and management. All questions answered   Follow up 3 months   I spent a total of 55 minutes dedicated to the care of this patient on the date of this encounter to include pre-visit review of records, face-to-face time with the patient discussing conditions above, post visit ordering of testing, clinical documentation with the electronic health record, making appropriate referrals as documented, and communicating necessary  information to the patient's healthcare team.    The Patient requires high complexity decision making for assessment and support, frequent evaluation and titration of therapies, application of advanced monitoring technologies and extensive interpretation of multiple databases.  Patient satisfied with Plan of action and management. All questions answered    Nickolas Alm Cellar, M.D.  [facility] Pulmonary [facility] Critical Care Medicine  Medical Director [facility] [place]

## 2024-03-16 ENCOUNTER — Telehealth: Payer: Self-pay

## 2024-03-16 NOTE — Telephone Encounter (Signed)
 Received Nucala new start paperwork. Submitted a Prior Authorization request to [employer] for NUCALA via CoverMyMeds. Will update once we receive a response.  Key: [id number]   Pt also has Medicare plan, will submit pa to Medicare once we get a response from commercial plan.

## 2024-03-20 NOTE — Telephone Encounter (Signed)
 Received a fax regarding Prior Authorization from [employer] for NUCALA. Authorization has been DENIED because:  Phone# [phone]

## 2024-03-21 ENCOUNTER — Telehealth: Payer: Self-pay | Admitting: Medical

## 2024-03-21 NOTE — Telephone Encounter (Signed)
-----   Message from [facility] Marina  L sent at 03/21/2024  8:03 AM EST ----- Pt scheduled to see Cadence tomorrow.  Pt hx BAE LS 05/2022. Pt recently see by [facility] cardiology last OV 02/2024. Please advise if pt will be staying with [facility] or switching back to [facility] health cardiology (if so pt should be scheduled as regular follow up and not new pt.)  Thank you, Marina ,CMA

## 2024-03-21 NOTE — Telephone Encounter (Signed)
 Called pt to see if he is staying with [facility] or [facility] as a patient

## 2024-03-21 NOTE — Telephone Encounter (Signed)
 Sonya from [employer] called because she has more questions regarding Nucala.  She will be sending a fax with that information.

## 2024-03-21 NOTE — Telephone Encounter (Signed)
 Pt returned call to f/u please advise

## 2024-03-21 NOTE — Telephone Encounter (Signed)
 Received fax from [employer] requesting information regarding eosinophil count.   Completed information and returned fax to [phone]  Will await response.

## 2024-03-21 NOTE — Telephone Encounter (Signed)
 Submitted URGENT appeal for NUCALA to [employer].   Fax # [phone]  Will await response - updates to follow in this thread.

## 2024-03-22 ENCOUNTER — Ambulatory Visit: Admitting: Medical

## 2024-03-22 NOTE — Progress Notes (Deleted)
°  Cardiology Office Note   Date:  03/22/2024  ID:  DESHAUN WEISINGER, DOB 03/26/1956, MRN [id number] PCP: Fernande Ophelia JINNY DOUGLAS, MD  [facility] Providers Cardiologist:  Redell Cave, MD { Click to update primary MD,subspecialty MD or APP then REFRESH:1}    History of Present Illness JAYMESON MENGEL III is a 68 y.o. male with a h/o OSA, COPD, former tobacco user, dyslipidemia, Ao atheroscelrosis who presents as a new patient.    ROS: ***  Studies Reviewed      *** Risk Assessment/Calculations {Does this patient have ATRIAL FIBRILLATION?:[phone]} No BP recorded.  {Refresh Note OR Click here to enter BP  :1}***       Physical Exam VS:  There were no vitals taken for this visit.       Wt Readings from Last 3 Encounters:  03/15/24 198 lb (89.8 kg)  02/10/24 192 lb 12.8 oz (87.5 kg)  08/03/23 191 lb 3.2 oz (86.7 kg)    GEN: Well nourished, well developed in no acute distress NECK: No JVD; No carotid bruits CARDIAC: ***RRR, no murmurs, rubs, gallops RESPIRATORY:  Clear to auscultation without rales, wheezing or rhonchi  ABDOMEN: Soft, non-tender, non-distended EXTREMITIES:  No edema; No deformity   ASSESSMENT AND PLAN ***    {Are you ordering a CV Procedure (e.g. stress test, cath, DCCV, TEE, etc)?   Press F2        :[id number]}  Dispo: ***  Signed, Brazos Sandoval VEAR Fishman, PA-C

## 2024-03-22 NOTE — Telephone Encounter (Signed)
 Received fax from [employer] - denial of Nucala was upheld despite appeal for the following reasons:

## 2024-03-24 ENCOUNTER — Other Ambulatory Visit: Payer: Self-pay | Admitting: Internal Medicine

## 2024-03-24 DIAGNOSIS — J309 Allergic rhinitis, unspecified: Secondary | ICD-10-CM

## 2024-03-24 DIAGNOSIS — J449 Chronic obstructive pulmonary disease, unspecified: Secondary | ICD-10-CM

## 2024-03-26 ENCOUNTER — Telehealth: Payer: Self-pay

## 2024-03-26 NOTE — Telephone Encounter (Signed)
 Copied from CRM #[id number]. Topic: Clinical - Medication Prior Auth >> Mar 26, 2024  1:53 PM Joesph PARAS wrote: Reason for CRM: Pharmacy Appeals PA rep Ronal is calling to discuss appeal attempt submitted for Nucala, states information is conflicting and would like clarification. Please c/b at [phone], OK to leave voicemail. Please indicate if Nucala is needed, states labs sent are indicating not-apporval. States Dupixent has been approved. >> Mar 26, 2024  4:27 PM Joesph PARAS wrote: Candace with the appeals coordinator with [employer] [phone] states she knows nothing about a prior caller and states the first level of claims has been denied and they will not handle the second level of appeals. Candace appears confused, even though pharmacist from [facility] (has already called and message was taken). She states that we would need to reach out to [facility] for an external review for any further appeals.

## 2024-03-26 NOTE — Telephone Encounter (Signed)
 Per Nucala biv encounter appeal was denied, will transition to pursuing Dupixent. Submitted a Prior Authorization request to [employer] for DUPIXENT via CoverMyMeds. Will update once we receive a response.  Key: [id number]   Pt also has Medicare plan, will submit pa to Medicare once we get a response from commercial plan.

## 2024-03-26 NOTE — Telephone Encounter (Signed)
 Copied from CRM #[id number]. Topic: Clinical - Medication Prior Auth >> Mar 26, 2024  1:53 PM Joesph PARAS wrote: Reason for CRM: Pharmacy Appeals PA rep Ronal is calling to discuss appeal attempt submitted for Nucala, states information is conflicting and would like clarification. Please c/b at [phone], OK to leave voicemail. Please indicate if Nucala is needed, states labs sent are indicating not-apporval. States Dupixent has been approved.

## 2024-03-27 ENCOUNTER — Other Ambulatory Visit (HOSPITAL_COMMUNITY): Payer: Self-pay

## 2024-03-27 NOTE — Telephone Encounter (Signed)
 Received notification from [facility] regarding a prior authorization for DUPIXENT. Authorization has been APPROVED from 03/26/24 to 09/24/24. Approval letter sent to scan center.  Per test claim, copay for 28 days supply is $0  Patient can fill through [facility]: [phone]   Authorization # [id number] Phone # [phone]

## 2024-04-02 NOTE — Telephone Encounter (Signed)
 Copied from CRM #[id number]. Topic: Clinical - Medication Prior Auth >> Mar 26, 2024  1:53 PM Joesph PARAS wrote: Reason for CRM: Pharmacy Appeals PA rep Ronal is calling to discuss appeal attempt submitted for Nucala, states information is conflicting and would like clarification. Please c/b at [phone], OK to leave voicemail. Please indicate if Nucala is needed, states labs sent are indicating not-apporval. States Dupixent has been approved. >> Apr 02, 2024 11:27 AM Russell PARAS wrote: Jhonnie, with [employer], is contacting clinic regarding approval for Dupixent. Pt would like to have medication filled through their pharmacy. Requested prescription be sent.  CB#  [phone]  FX#  [phone] >> Mar 26, 2024  4:27 PM Joesph PARAS wrote: Candace with the appeals coordinator with [employer] [phone] states she knows nothing about a prior caller and states the first level of claims has been denied and they will not handle the second level of appeals. Candace appears confused, even though pharmacist from [facility] (has already called and message was taken). She states that we would need to reach out to [facility] for an external review for any further appeals.     Please advise

## 2024-04-03 ENCOUNTER — Encounter: Payer: Self-pay | Admitting: Internal Medicine

## 2024-04-04 ENCOUNTER — Ambulatory Visit

## 2024-04-04 DIAGNOSIS — J449 Chronic obstructive pulmonary disease, unspecified: Secondary | ICD-10-CM

## 2024-04-04 MED ORDER — DUPIXENT 300 MG/2ML ~~LOC~~ SOAJ
300.0000 mg | SUBCUTANEOUS | 3 refills | Status: AC
  Filled 2024-04-06 – 2024-05-05 (×3): qty 4, 28d supply, fill #0

## 2024-04-04 MED ORDER — DUPIXENT 300 MG/2ML ~~LOC~~ SOAJ
300.0000 mg | SUBCUTANEOUS | 0 refills | Status: DC
  Filled 2024-04-06 – 2024-04-09 (×2): qty 4, 28d supply, fill #0
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
## 2024-04-04 NOTE — Telephone Encounter (Signed)
 See pharmacotherapy visit 04/04/24 for initial counseling.   First Dupixent injection scheduled at [facility] on 04/16/24.

## 2024-04-06 ENCOUNTER — Other Ambulatory Visit: Payer: Self-pay

## 2024-04-06 ENCOUNTER — Other Ambulatory Visit (HOSPITAL_COMMUNITY): Payer: Self-pay

## 2024-04-06 NOTE — Progress Notes (Signed)
 Specialty Pharmacy Initial Fill Coordination Note  David [name] is a 69 y.o. male contacted today regarding initial fill of specialty medication(s) Dupilumab (Dupixent)   Patient requested Courier to Provider Office   Delivery date: 04/10/24   Verified address: [address], [place], [place] [address]   Medication will be filled on: 04/09/24   Patient is aware of $20 copayment.

## 2024-04-09 ENCOUNTER — Other Ambulatory Visit: Payer: Self-pay

## 2024-04-10 ENCOUNTER — Other Ambulatory Visit: Payer: Self-pay

## 2024-04-11 ENCOUNTER — Other Ambulatory Visit (HOSPITAL_COMMUNITY): Payer: Self-pay

## 2024-04-11 ENCOUNTER — Telehealth: Payer: Self-pay

## 2024-04-16 ENCOUNTER — Ambulatory Visit

## 2024-04-16 DIAGNOSIS — J449 Chronic obstructive pulmonary disease, unspecified: Secondary | ICD-10-CM

## 2024-04-16 DIAGNOSIS — Z7189 Other specified counseling: Secondary | ICD-10-CM

## 2024-04-16 NOTE — Patient Instructions (Signed)
 Your next Dupixent  dose is due on 04/30/24, 05/14/24, and every 14 days thereafter  This medication is stored in the refrigerator. Please note, you should remove the drug from the refrigerator AT LEAST 45 minutes prior to injection to ensure the injection comes down to room temperature before injecting the medication.   If needed, this medication can be stored at room temperature for no longer than 14 days.   If you miss a dose, you should take it as soon as you remember it within 7 days of the missed dose, then continue your usual dosing schedule.  As a reminder, some side effects include injection site reaction, back ache, and rash. If you are struggling with side effects, please call our office.   It can take several weeks to see the potential benefit of the medication. Please continue your other medications.   CONTINUE Trelegy and Ohtuvayre .   Your prescription will be shipped from [facility]: [phone].   Someone will call to schedule shipment and confirm address. They will mail your medication to your home.  You will need to be seen by your provider in 3 to 4 months to assess how Dupixent  is working for you. You have a follow-up appointment scheduled with Dr. Isaiah on 05/15/24.   Stay up to date on all routine vaccines: influenza, pneumonia, COVID19, Shingles  How to manage an injection site reaction: Remember the 5 C's: COUNTER - leave on the counter at least 30 minutes but up to overnight to bring medication to room temperature. This may help prevent stinging COLD - place something cold (like an ice gel pack or cold water bottle) on the injection site just before cleansing with alcohol. This may help reduce pain CLARITIN - use Claritin (generic name is loratadine) for the first two weeks of treatment or the day of, the day before, and the day after injecting. This will help to minimize injection site reactions CORTISONE CREAM - apply if injection site is  irritated and itching CALL ME - if injection site reaction is bigger than the size of your fist, looks infected, blisters, or if you develop hives

## 2024-04-16 NOTE — Progress Notes (Signed)
 "  HPI Patient presents today to [facility] to see pharmacy team for first injection of Dupixent . Past medical history includes severe COPD. Last seen by Dr. Isaiah on 03/15/24. At that time, roflumilast  discontinued due to side effects. Referral to pharmacy team for new start biologic therapy.   Today, patient reports COPD significantly impacts quality of life. He feels he is getting close to having to stop golfing, which has been a lifelong hobby. Due to difficulty breathing, golf feels like work. He is hoping Dupixent  injection will help.  Respiratory Medications Current regimen:  - Trelegy 200-62.5-25 MCG/ACT (Inhale 1 puff once daily)  - Ohtuvayre  3mg /2.49mL (Inhale 3mg  by nebulizer twice daily) - when I remember, reports he misses about 10 doses per month  - Ventolin  108 mcg/act (Use 2 inhalations by mouth q6h PRN wheezing)   OBJECTIVE Allergies[1]  Outpatient Encounter Medications as of 04/16/2024  Medication Sig   albuterol  (VENTOLIN  HFA) 108 (90 Base) MCG/ACT inhaler USE 2 INHALATIONS BY MOUTH EVERY 6 HOURS AS NEEDED FOR WHEEZING   Dupilumab  (DUPIXENT ) 300 MG/2ML SOAJ Inject 300 mg into the skin every 14 (fourteen) days. Courier to pulm: [address], [address], [place] [address]. Appt on 04/17/23   Dupilumab  (DUPIXENT ) 300 MG/2ML SOAJ Inject 300 mg into the skin every 14 (fourteen) days.   furosemide (LASIX) 40 MG tablet Take 40 mg by mouth daily.   levothyroxine  (SYNTHROID , LEVOTHROID) 112 MCG tablet Take 112 mcg by mouth daily before breakfast.   montelukast  (SINGULAIR ) 10 MG tablet TAKE 1 TABLET BY MOUTH DAILY   roflumilast  (DALIRESP ) 500 MCG TABS tablet Take 1 tablet (500 mcg total) by mouth daily. (Patient not taking: Reported on 03/15/2024)   rosuvastatin  (CRESTOR ) 20 MG tablet Take 20 mg by mouth at bedtime.   spironolactone (ALDACTONE) 100 MG tablet Take 100 mg by mouth daily.   TRELEGY ELLIPTA  200-62.5-25 MCG/ACT AEPB USE 1 INHALATION BY MOUTH DAILY   vitamin  B-12 (CYANOCOBALAMIN) 1000 MCG tablet Take 1,000 mcg by mouth daily.   Vitamin D, Cholecalciferol, 10 MCG (400 UNIT) TABS Take 1 tablet by mouth daily.   XDEMVY 0.25 % SOLN Apply 1 drop to eye 2 (two) times daily.   No facility-administered encounter medications on file as of 04/16/2024.     Immunization History  Administered Date(s) Administered    sv, Bivalent, Protein Subunit Rsvpref,pf (Abrysvo) 01/03/2022   INFLUENZA, HIGH DOSE SEASONAL PF 02/15/2020   Influenza Inj Mdck Quad Pf 02/15/2020, 02/24/2021   Influenza Split 01/09/2018   Influenza, Mdck, Trivalent,PF 6+ MOS(egg free) 03/25/2023   Influenza,inj,Quad PF,6+ Mos 01/21/2017, 12/18/2018   Influenza-Unspecified 12/26/2015   PFIZER Comirnaty(Gray Top)Covid-19 Tri-Sucrose Vaccine 06/24/2019, 07/15/2019, 03/11/2020, 03/21/2020, 10/15/2020   PFIZER(Purple Top)SARS-COV-2 Vaccination 06/24/2019, 07/15/2019   PNEUMOCOCCAL CONJUGATE-20 01/03/2022   Pneumococcal Polysaccharide-23 12/15/2016   Td,absorbed, Preservative Free, Adult Use, Lf Unspecified 08/26/2016   Zoster Recombinant(Shingrix) 02/09/2017, 05/04/2017     PFTs    Latest Ref Rng & Units 11/03/2015    2:23 PM  PFT Results  FVC-Pre L 2.16  P  FVC-Predicted Pre % 45  P  FVC-Post L 2.19  P  FVC-Predicted Post % 46  P  Pre FEV1/FVC % % 48  P  Post FEV1/FCV % % 49  P  FEV1-Pre L 1.03  P  FEV1-Predicted Pre % 28  P  FEV1-Post L 1.08  P  DLCO uncorrected ml/min/mmHg 21.56  P  DLCO UNC% % 66  P  DLVA Predicted % 109  P  P Preliminary result     Eosinophils Most recent blood eosinophil count was 70 cells/microL taken on 04/02/24.    Assessment   Biologics training for dupilumab  (Dupixent )  Goals of therapy: Mechanism: human monoclonal IgG4 antibody that inhibits interleukin-4 and interleukin-13 cytokine-induced responses, including release of proinflammatory cytokines, chemokines, and IgE Reviewed that Dupixent  is add-on medication and patient must continue  maintenance inhaler regimen. Response to therapy: may take 4 months to determine efficacy. Discussed that patients generally feel improvement sooner than 4 months.  Side effects: injection site reaction (6-18%), antibody development (5-16%), ophthalmic conjunctivitis (2-16%), transient blood eosinophilia (1-2%)  Dose: 300mg  every 14 days (for COPD)  Administration/Storage:  Reviewed administration sites of thigh or abdomen (at least 2-3 inches away from abdomen). Reviewed the upper arm is only appropriate if caregiver is administering injection  Do not shake pen/syringe as this could lead to product foaming or precipitation. Do not use if solution is discolored or contains particulate matter or if window on prefilled pen is yellow (indicates pen has been used).  Reviewed storage of medication in refrigerator. Reviewed that Dupixent  can be stored at room temperature in unopened carton for up to 14 days.  Access: Approval of Dupixent  through: insurance  Patient self-administered Dupixent  300mg /33ml x 1 (total dose 300mg ) in right upper thigh using [facility]-supplied medication  Sent patient home with remaining   Dupixent  300mg /28mL autoinjector pen NDC: [phone] Lot: [id number] Expiration: 2026-07-04  Patient monitored for 30 minutes for adverse reaction.  Patient tolerated well.  Injection site noted. Patient denies itchiness and irritation at injection.  Medication Reconciliation  A drug regimen assessment was performed, including review of allergies, interactions, disease-state management, dosing and immunization history. Medications were reviewed with the patient, including name, instructions, indication, goals of therapy, potential side effects, importance of adherence, and safe use.   PLAN Continue Dupixent  300mg  every 14 days.  Next dose is due 04/30/24 and every 14 days thereafter. Rx sent to: [facility]: [phone] .  Patient provided with pharmacy phone  number. Continue maintenance inhaler regimen of: Trelegy 200-62.5-25 mcg/act (Inhale 1 puff once daily)  Patient declined printed AVS - plans to access in MyChart  All questions encouraged and answered.  Instructed patient to reach out with any further questions or concerns.  Thank you for allowing pharmacy to participate in this patient's care.  This appointment required 45 minutes of patient care (this includes precharting, chart review, review of results, face-to-face care, etc.).  Aleck Puls, PharmD, BCPS, CPP Clinical Pharmacist  [facility]  [facility]     [1]  Allergies Allergen Reactions   Penicillins Other (See Comments)    Unknown/ Childhood   "

## 2024-04-16 NOTE — Telephone Encounter (Signed)
 First injection completed 04/16/24 in clinic. Tolerated well.

## 2024-04-20 ENCOUNTER — Telehealth: Payer: Self-pay

## 2024-04-20 NOTE — Telephone Encounter (Signed)
 Dupixent paperwork

## 2024-04-26 NOTE — Telephone Encounter (Signed)
 Received fax from [employer] as of 04/11/2024. Patient's Dupixent  requires prior authorization  Submitted an URGENT Prior Authorization request to [facility] for DUPIXENT  via CoverMyMeds. Will update once we receive a response.  Key: [id number]   Per automated response: The patient currently has access to the requested medication and a Prior Authorization is not needed for the patient/medication.

## 2024-04-27 NOTE — Telephone Encounter (Signed)
 Patient already on treatment and filling with [facility]. His shara is approved. Closing tihs

## 2024-04-30 ENCOUNTER — Ambulatory Visit: Payer: Self-pay

## 2024-04-30 NOTE — Telephone Encounter (Signed)
 FYI Only or Action Required?: Action required by provider: clinical question for provider.  Patient was last seen in primary care on .  Called Nurse Triage reporting Hip Pain.  Symptoms began several days ago.  Interventions attempted: OTC medications: aspercreme, advil.  Symptoms are: rapidly worsening.  Triage Disposition: See PCP When Office is Open (Within 3 Days)  Patient/caregiver understands and will follow disposition?: Yes   Reason for Triage: Patient [phone] states is having a side effects from taking Dupilumab  (DUPIXENT ) 300 MG/2ML SOAJ, started swelling with the left hip and calf, and travel to the left foot is partially bigger/swollen than right foot,  painful, tough time walking and to getting up. Patient toke Duxipent two weeks and is suppose to take it again today/ Patient is concerned and wants to see or speak with Dr. Isaiah or NP. Please advise.   Reason for Disposition  [1] MODERATE pain (e.g., interferes with normal activities, limping) AND [2] present > 3 days  Answer Assessment - Initial Assessment Questions Questions for MD re: med side effects.  Advised to take medication today. Reach out to PCP rt hip pain as well.   1. LOCATION and RADIATION: Where is the pain located? Does the pain spread (shoot) anywhere else?     Rt hip 2. QUALITY: What does the pain feel like?  (e.g., sharp, dull, aching, burning)      3. SEVERITY: How bad is the pain? What does it keep you from doing?   (Scale 1-10; or mild, moderate, severe)     Moderate, is about to walk, is limping 4. ONSET: When did the pain start? Does it come and go, or is it there all the time?     Few days 5. WORK OR EXERCISE: Has there been any recent work or exercise that involved this part of the body?      denies 6. CAUSE: What do you think is causing the hip pain?      Questions if dupixent  7. AGGRAVATING FACTORS: What makes the hip pain worse? (e.g., walking, climbing stairs,  running)     walking 8. OTHER SYMPTOMS: Do you have any other symptoms? (e.g., back pain, pain shooting down leg,  fever, rash)     Pain radiates down to foot, mild swelling of foot.  Protocols used: Hip Pain-A-AH

## 2024-05-01 ENCOUNTER — Encounter (INDEPENDENT_AMBULATORY_CARE_PROVIDER_SITE_OTHER): Payer: Self-pay

## 2024-05-01 ENCOUNTER — Other Ambulatory Visit: Payer: Self-pay

## 2024-05-01 NOTE — Progress Notes (Signed)
 Please see notes 04/04/24 and 04/16/24 - patient was educated in detail on Dupixent  prior to first injection in clinic on 04/16/24.   PLAN Continue Dupixent  300mg  every 14 days.  Next dose is due 04/30/24 and every 14 days thereafter. Rx sent to: [facility]: [phone] .  Patient provided with pharmacy phone number. Continue maintenance inhaler regimen of: Trelegy 200-62.5-25 mcg/act (Inhale 1 puff once daily)  Patient declined printed AVS - plans to access in MyChart   All questions encouraged and answered.  Instructed patient to reach out with any further questions or concerns.  Aleck Puls, PharmD, BCPS, CPP Clinical Pharmacist  [facility]  [facility]

## 2024-05-02 ENCOUNTER — Other Ambulatory Visit (HOSPITAL_COMMUNITY): Payer: Self-pay

## 2024-05-02 NOTE — Telephone Encounter (Addendum)
 I spoke with thte patient. He said his symptoms started a week and a half after after his first Dupixent  injection. He was having hip, calf, ankle and foot pain. He took his second Dupixent  injection on 1/26 and not he says he is feeling better. The ankle and foot pain are gone. He is still having hip and calf pain but it is better, he says he can deal with it. He did say that he the Dupixent  has been helping with his breathing. He is scheduled to have his next Dupixent  injection on 05/14/2024 and see you on 05/15/2024. Should he hold off on the injection until after he sees you?

## 2024-05-02 NOTE — Progress Notes (Signed)
 Specialty Pharmacy Refill Coordination Note  David [name] is a 69 y.o. male contacted today regarding refills of specialty medication(s) Dupilumab  (Dupixent )   Patient requested Delivery   Delivery date: 05/09/24   Verified address: [address]   [place], [place].  [address]   Medication will be filled on: 05/08/24

## 2024-05-05 ENCOUNTER — Other Ambulatory Visit (HOSPITAL_COMMUNITY): Payer: Self-pay

## 2024-05-08 ENCOUNTER — Other Ambulatory Visit: Payer: Self-pay

## 2024-05-15 ENCOUNTER — Ambulatory Visit: Admitting: Internal Medicine

## 2024-06-04 ENCOUNTER — Ambulatory Visit: Admit: 2024-06-04 | Admitting: Gastroenterology

## 2024-06-04 SURGERY — EGD (ESOPHAGOGASTRODUODENOSCOPY)
Anesthesia: General
